# Patient Record
Sex: Female | Born: 1990 | Race: Black or African American | Hispanic: No | Marital: Single | Smoking: Current every day smoker
Health system: Southern US, Community
[De-identification: ages and names within clinical notes are randomized; demographics above are authoritative.]

## PROBLEM LIST (undated history)

## (undated) ENCOUNTER — Inpatient Hospital Stay: Payer: Self-pay

## (undated) DIAGNOSIS — F1410 Cocaine abuse, uncomplicated: Secondary | ICD-10-CM

## (undated) DIAGNOSIS — F1994 Other psychoactive substance use, unspecified with psychoactive substance-induced mood disorder: Secondary | ICD-10-CM

## (undated) DIAGNOSIS — D649 Anemia, unspecified: Secondary | ICD-10-CM

## (undated) DIAGNOSIS — R0600 Dyspnea, unspecified: Secondary | ICD-10-CM

## (undated) DIAGNOSIS — F1120 Opioid dependence, uncomplicated: Secondary | ICD-10-CM

## (undated) DIAGNOSIS — F99 Mental disorder, not otherwise specified: Secondary | ICD-10-CM

---
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
## 2009-02-01 ENCOUNTER — Observation Stay: Payer: Self-pay

## 2009-02-05 ENCOUNTER — Observation Stay: Payer: Self-pay | Admitting: Obstetrics & Gynecology

## 2009-02-06 ENCOUNTER — Inpatient Hospital Stay: Payer: Self-pay | Admitting: Unknown Physician Specialty

## 2010-01-05 ENCOUNTER — Emergency Department: Payer: Self-pay | Admitting: Emergency Medicine

## 2010-02-08 ENCOUNTER — Emergency Department: Payer: Self-pay | Admitting: Emergency Medicine

## 2011-12-06 ENCOUNTER — Emergency Department: Payer: Self-pay | Admitting: *Deleted

## 2013-09-25 ENCOUNTER — Inpatient Hospital Stay: Payer: Self-pay | Admitting: Obstetrics and Gynecology

## 2013-09-25 LAB — PRENATAL PANEL
ABO/RH(D): A POS
Antibody Screen: NEGATIVE
Glucose: 74 mg/dL (ref 65–99)
HCT: 31.5 % — AB (ref 35.0–47.0)
HGB: 10.3 g/dL — ABNORMAL LOW (ref 12.0–16.0)
MCH: 30 pg (ref 26.0–34.0)
MCHC: 32.6 g/dL (ref 32.0–36.0)
MCV: 92 fL (ref 80–100)
PLATELETS: 164 10*3/uL (ref 150–440)
RBC: 3.42 10*6/uL — ABNORMAL LOW (ref 3.80–5.20)
RDW: 14.2 % (ref 11.5–14.5)
WBC: 9.9 10*3/uL (ref 3.6–11.0)

## 2013-09-25 LAB — DRUG SCREEN, URINE
Amphetamines, Ur Screen: NEGATIVE (ref ?–1000)
BENZODIAZEPINE, UR SCRN: NEGATIVE (ref ?–200)
Barbiturates, Ur Screen: NEGATIVE (ref ?–200)
Cannabinoid 50 Ng, Ur ~~LOC~~: POSITIVE (ref ?–50)
Cocaine Metabolite,Ur ~~LOC~~: NEGATIVE (ref ?–300)
MDMA (Ecstasy)Ur Screen: NEGATIVE (ref ?–500)
Methadone, Ur Screen: NEGATIVE (ref ?–300)
OPIATE, UR SCREEN: POSITIVE (ref ?–300)
Phencyclidine (PCP) Ur S: NEGATIVE (ref ?–25)
TRICYCLIC, UR SCREEN: NEGATIVE (ref ?–1000)

## 2013-09-25 LAB — GC/CHLAMYDIA PROBE AMP

## 2013-09-25 LAB — RAPID HIV-1/2 QL/CONFIRM: HIV-1/2,Rapid Ql: NEGATIVE

## 2013-09-27 LAB — HEMATOCRIT: HCT: 26.2 % — ABNORMAL LOW (ref 35.0–47.0)

## 2013-10-27 ENCOUNTER — Emergency Department: Payer: Self-pay | Admitting: Emergency Medicine

## 2013-12-05 ENCOUNTER — Emergency Department: Payer: Self-pay | Admitting: Emergency Medicine

## 2013-12-20 ENCOUNTER — Emergency Department: Payer: Self-pay | Admitting: Internal Medicine

## 2014-03-18 ENCOUNTER — Emergency Department: Payer: Self-pay | Admitting: Emergency Medicine

## 2014-06-19 ENCOUNTER — Emergency Department: Payer: Self-pay | Admitting: Internal Medicine

## 2014-07-09 ENCOUNTER — Emergency Department: Payer: Self-pay | Admitting: Emergency Medicine

## 2014-07-24 ENCOUNTER — Emergency Department: Payer: Self-pay | Admitting: Emergency Medicine

## 2014-07-24 LAB — URINALYSIS, COMPLETE
BACTERIA: NONE SEEN
BILIRUBIN, UR: NEGATIVE
GLUCOSE, UR: NEGATIVE mg/dL (ref 0–75)
KETONE: NEGATIVE
Leukocyte Esterase: NEGATIVE
Nitrite: NEGATIVE
PH: 8 (ref 4.5–8.0)
Protein: 30
Specific Gravity: 1.024 (ref 1.003–1.030)
Squamous Epithelial: 4

## 2014-07-24 LAB — COMPREHENSIVE METABOLIC PANEL
ALK PHOS: 126 U/L — AB
AST: 48 U/L — AB (ref 15–37)
Albumin: 3.3 g/dL — ABNORMAL LOW (ref 3.4–5.0)
Anion Gap: 4 — ABNORMAL LOW (ref 7–16)
BUN: 9 mg/dL (ref 7–18)
Bilirubin,Total: 0.2 mg/dL (ref 0.2–1.0)
CHLORIDE: 107 mmol/L (ref 98–107)
Calcium, Total: 8.6 mg/dL (ref 8.5–10.1)
Co2: 27 mmol/L (ref 21–32)
Creatinine: 0.62 mg/dL (ref 0.60–1.30)
EGFR (Non-African Amer.): >60
GLUCOSE: 95 mg/dL (ref 65–99)
Osmolality: 274 (ref 275–301)
Potassium: 3.8 mmol/L (ref 3.5–5.1)
SGPT (ALT): 53 U/L
Sodium: 138 mmol/L (ref 136–145)
TOTAL PROTEIN: 7.2 g/dL (ref 6.4–8.2)

## 2014-07-24 LAB — CBC
HCT: 37.6 % (ref 35.0–47.0)
HGB: 12.2 g/dL (ref 12.0–16.0)
MCH: 27.3 pg (ref 26.0–34.0)
MCHC: 32.4 g/dL (ref 32.0–36.0)
MCV: 84 fL (ref 80–100)
PLATELETS: 274 10*3/uL (ref 150–440)
RBC: 4.46 10*6/uL (ref 3.80–5.20)
RDW: 14.1 % (ref 11.5–14.5)
WBC: 7.2 10*3/uL (ref 3.6–11.0)

## 2014-07-24 LAB — WET PREP, GENITAL

## 2014-07-25 LAB — GC/CHLAMYDIA PROBE AMP

## 2014-09-10 ENCOUNTER — Emergency Department: Payer: Self-pay | Admitting: Emergency Medicine

## 2014-11-03 NOTE — Consult Note (Signed)
   Maternal Age 24   Gravida 2   Para 1   Term Deliveries 1   Preterm Deliveries 0   Abortions 0   Living Children 1   Final EDD (dd-[id number]) 22-Sep-2013   GA Assessment: (Weeks) 40 week(s)   (Days) 3 day(s)   Gestation Single   Blood Type (Maternal) A positive   Antibody Screen Results (Maternal) negative   HIV Results (Maternal) unknown   Gonorrhea Results (Maternal) unknown   Chlamydia Results (Maternal) unknown   Hepatitis C Culture (Maternal) unknown   VDRL/RPR/Syphilis Results (Maternal) unknown   Varicella Titer Results (Maternal) Unknown   Rubella Results (Maternal) unknown   Hepatitis B Surface Antigen Results (Maternal) unknown   Group B Strep Results Maternal (Result >5wks must be treated as unknown) unknown/result > 5 weeks ago   GBS Prophylaxis hours prior to delivery Initiated on admission   Prenatal Care Poor    Additional Comments Ms. Tara BroachOctavia Raymond is a 24 y.o. G2P1001 female at 40.3 weeks by dates (LMP sometime in July[facility], who presents to L&D secondary to postdates status. Likely PPROM with leaking of fluid x 1 week. Prenatal records unavailable. She reports having been seen by Health Department since 6 months of pregnancy in [place].  Recently relocated [place], [place]. An ultrasound was performed on admission with noted anhydramnios (vs. loss of fluid) and size discrepancy with estimate of  35 weeks completed gestation. Pregnancy otherwise uncomplicated though records indicate EtOH and marijuana use during this pregnancy. Induction in progress. Possibility that infant may acutally be late preterm was discussed with [name] including possibility of SCN admission due to complications including sepsis, thermoregulation, hypoglycemia and feeding difficulties.  RECOMMENDATIONS: - Attempt to obtain prenatal care records from [place] - Follow-up results of prenatal labs to include HIV, Hep B and RPR - Continue  antibiotics for GBS prophylaxis - Obtain urine and meconium for toxicology screens after birth due to inadequate prenatal care - Monitor for problems associated with late preterm infant as mentioned above - Provide early feeding and monitor glucoses closely - Monitor for s/s of sepsis  Thank you for allowing us to participate in [name]' care. We will be available as needed upon delivery.   Parental Contact Parents informed at length regarding prenatal care and plan   Electronic Signatures: Lowella CurbBlake, Lamarr Feenstra (NP)   (Signed 16-Mar-15 19:07)  Authored: PREGNANCY and LABOR, ADDITIONAL COMMENTS Serita GritWimmer, John E (MD)   (Signed 02-Apr-15 09:22)  Co-Signer: PREGNANCY and LABOR, ADDITIONAL COMMENTS  Last Updated: 02-Apr-15 09:22 by Serita GritWimmer, John E (MD)

## 2014-11-20 NOTE — H&P (Signed)
L&D Evaluation:  History:  HPI Ms. Don BroachOctavia Cheatwood is a 24 y.o. G2P1001 female at 40.3 weeks by dates (LMP sometime in July), [facility] with [facility], who presents to L&D secondary to postdates status.  Denies contractions, vaginal bleeding, decreased fetal movement.  Does report LOF x 1 week. Denies fevers, chills, abdominal pain.  Has been seen by Health Department since 6 months of pregnancy in [place].  Recently relocated to [place], [place].   Presents with leaking fluid, postdates status   Patient's Medical History No Chronic Illness  h/o low back pain   Patient's Surgical History none   Medications Pre Natal Vitamins   Allergies NKDA   Social History EtOH  marijuana (for nausea/vomiting). h/o ETOH prior to discovery of pregnancy.   Family History Non-Contributory   ROS:  ROS All systems were reviewed.  HEENT, CNS, GI, GU, Respiratory, CV, Renal and Musculoskeletal systems were found to be normal.   Exam:  Vital Signs stable   General no apparent distress   Mental Status clear   Chest clear   Heart normal sinus rhythm, no murmur/gallop/rubs   Abdomen gravid, non-tender   Estimated Fetal Weight Small for gestational age   Fetal Position cephalic   Back no CVAT   Edema 1+  Nonpitting  ankle   Reflexes 2+   Clonus negative   Pelvic no external lesions, cervix 1/30/c/-3/PPROM   Mebranes Ruptured   Description clear, per pt (   FHT normal rate with no decels   Fetal Heart Rate 140   Ucx absent   Other Ultrasound reports EGA 35.2 weeks, EDD 10/28/13, normal gross anatomy, cephalic, anterior placenta without previa, annhydramnios (largest pocket 0.2 cm).   Impression:  Impression PPROM, prolonged, currently afebrile, no  PNMC   Plan:  Plan EFM/NST, monitor contractions and for cervical change, antibiotics for GBBS prophylaxis, fluids, Induction of labor with Pitocin (low dose)   Comments Pediatric consult for prematurity.  GBS status  unknown.  Will start GBS prophylaxis.  Non-clinic labs ordered.  Will attempt to get records from Health Department in [place]. Limit pelvic exams due to membrane ruptured status.  Monitor for s/s of chorioamnionitis.   Electronic Signatures: Fabian Novemberherry, Logen Heintzelman S (MD)  (Signed 16-Mar-15 23:26)  Authored: L&D Evaluation   Last Updated: 16-Mar-15 23:26 by Fabian Novemberherry, Cana Mignano S (MD)

## 2016-01-28 ENCOUNTER — Encounter: Payer: Self-pay | Admitting: Emergency Medicine

## 2016-01-28 ENCOUNTER — Inpatient Hospital Stay
Admission: EM | Admit: 2016-01-28 | Discharge: 2016-02-04 | Disposition: A | Discharge status: Home / Self Care | Payer: Medicaid Other | Attending: Emergency Medicine | Admitting: Emergency Medicine

## 2016-01-28 DIAGNOSIS — F1123 Opioid dependence with withdrawal: Secondary | ICD-10-CM

## 2016-01-28 DIAGNOSIS — F1110 Opioid abuse, uncomplicated: Secondary | ICD-10-CM | POA: Diagnosis not present

## 2016-01-28 DIAGNOSIS — N390 Urinary tract infection, site not specified: Secondary | ICD-10-CM | POA: Insufficient documentation

## 2016-01-28 DIAGNOSIS — O99333 Smoking (tobacco) complicating pregnancy, third trimester: Secondary | ICD-10-CM | POA: Diagnosis not present

## 2016-01-28 DIAGNOSIS — O99323 Drug use complicating pregnancy, third trimester: Secondary | ICD-10-CM | POA: Insufficient documentation

## 2016-01-28 DIAGNOSIS — Z3A38 38 weeks gestation of pregnancy: Secondary | ICD-10-CM | POA: Diagnosis not present

## 2016-01-28 DIAGNOSIS — O26833 Pregnancy related renal disease, third trimester: Secondary | ICD-10-CM | POA: Insufficient documentation

## 2016-01-28 DIAGNOSIS — F1193 Opioid use, unspecified with withdrawal: Secondary | ICD-10-CM

## 2016-01-28 DIAGNOSIS — F17210 Nicotine dependence, cigarettes, uncomplicated: Secondary | ICD-10-CM | POA: Insufficient documentation

## 2016-01-28 DIAGNOSIS — Z5181 Encounter for therapeutic drug level monitoring: Secondary | ICD-10-CM | POA: Insufficient documentation

## 2016-01-28 DIAGNOSIS — F1910 Other psychoactive substance abuse, uncomplicated: Secondary | ICD-10-CM | POA: Insufficient documentation

## 2016-01-28 DIAGNOSIS — F1410 Cocaine abuse, uncomplicated: Secondary | ICD-10-CM | POA: Diagnosis not present

## 2016-01-28 DIAGNOSIS — F1111 Opioid abuse, in remission: Secondary | ICD-10-CM

## 2016-01-28 DIAGNOSIS — O2343 Unspecified infection of urinary tract in pregnancy, third trimester: Secondary | ICD-10-CM

## 2016-01-28 DIAGNOSIS — Z046 Encounter for general psychiatric examination, requested by authority: Secondary | ICD-10-CM

## 2016-01-28 DIAGNOSIS — Z720 Tobacco use: Secondary | ICD-10-CM

## 2016-01-28 DIAGNOSIS — F322 Major depressive disorder, single episode, severe without psychotic features: Secondary | ICD-10-CM

## 2016-01-28 DIAGNOSIS — F1994 Other psychoactive substance use, unspecified with psychoactive substance-induced mood disorder: Secondary | ICD-10-CM

## 2016-01-28 DIAGNOSIS — O0930 Supervision of pregnancy with insufficient antenatal care, unspecified trimester: Secondary | ICD-10-CM

## 2016-01-28 DIAGNOSIS — F1420 Cocaine dependence, uncomplicated: Secondary | ICD-10-CM

## 2016-01-28 DIAGNOSIS — Z3490 Encounter for supervision of normal pregnancy, unspecified, unspecified trimester: Secondary | ICD-10-CM

## 2016-01-28 DIAGNOSIS — Z3493 Encounter for supervision of normal pregnancy, unspecified, third trimester: Secondary | ICD-10-CM

## 2016-01-28 HISTORY — DX: Mental disorder, not otherwise specified: F99

## 2016-01-28 LAB — URINE DRUG SCREEN, QUALITATIVE (ARMC ONLY)
AMPHETAMINES, UR SCREEN: NOT DETECTED
BENZODIAZEPINE, UR SCRN: NOT DETECTED
Barbiturates, Ur Screen: NOT DETECTED
Cannabinoid 50 Ng, Ur ~~LOC~~: POSITIVE — AB
Cocaine Metabolite,Ur ~~LOC~~: POSITIVE — AB
MDMA (Ecstasy)Ur Screen: NOT DETECTED
METHADONE SCREEN, URINE: NOT DETECTED
Opiate, Ur Screen: NOT DETECTED
PHENCYCLIDINE (PCP) UR S: NOT DETECTED
Tricyclic, Ur Screen: NOT DETECTED

## 2016-01-28 LAB — CBC
HEMATOCRIT: 31.8 % — AB (ref 35.0–47.0)
Hemoglobin: 10.3 g/dL — ABNORMAL LOW (ref 12.0–16.0)
MCH: 26.9 pg (ref 26.0–34.0)
MCHC: 32.6 g/dL (ref 32.0–36.0)
MCV: 82.5 fL (ref 80.0–100.0)
Platelets: 187 10*3/uL (ref 150–440)
RBC: 3.85 MIL/uL (ref 3.80–5.20)
RDW: 14.6 % — AB (ref 11.5–14.5)
WBC: 5.3 10*3/uL (ref 3.6–11.0)

## 2016-01-28 LAB — COMPREHENSIVE METABOLIC PANEL
ALK PHOS: 154 U/L — AB (ref 38–126)
ALT: 53 U/L (ref 14–54)
AST: 77 U/L — AB (ref 15–41)
Albumin: 2.9 g/dL — ABNORMAL LOW (ref 3.5–5.0)
Anion gap: 7 (ref 5–15)
BUN: 6 mg/dL (ref 6–20)
CHLORIDE: 102 mmol/L (ref 101–111)
CO2: 25 mmol/L (ref 22–32)
CREATININE: 0.55 mg/dL (ref 0.44–1.00)
Calcium: 9 mg/dL (ref 8.9–10.3)
GFR calc Af Amer: >60 mL/min (ref >60)
Glucose, Bld: 100 mg/dL — ABNORMAL HIGH (ref 65–99)
Potassium: 3 mmol/L — ABNORMAL LOW (ref 3.5–5.1)
SODIUM: 134 mmol/L — AB (ref 135–145)
Total Bilirubin: 0.5 mg/dL (ref 0.3–1.2)
Total Protein: 6.9 g/dL (ref 6.5–8.1)

## 2016-01-28 LAB — SALICYLATE LEVEL

## 2016-01-28 LAB — URINALYSIS COMPLETE WITH MICROSCOPIC (ARMC ONLY)
Bilirubin Urine: NEGATIVE
GLUCOSE, UA: NEGATIVE mg/dL
HGB URINE DIPSTICK: NEGATIVE
Ketones, ur: NEGATIVE mg/dL
Nitrite: NEGATIVE
Protein, ur: NEGATIVE mg/dL
Specific Gravity, Urine: 1.006 (ref 1.005–1.030)
pH: 6 (ref 5.0–8.0)

## 2016-01-28 LAB — ETHANOL: Alcohol, Ethyl (B): <5 mg/dL (ref <5)

## 2016-01-28 LAB — ACETAMINOPHEN LEVEL

## 2016-01-28 MED ORDER — ONDANSETRON 4 MG PO TBDP
4.0000 mg | ORAL_TABLET | Freq: Once | ORAL | Status: AC
  Administered 2016-01-28: 4 mg via ORAL
  Filled 2016-01-28: qty 1

## 2016-01-28 MED ORDER — CEPHALEXIN 500 MG PO CAPS: 500.0000 mg | ORAL_CAPSULE | Freq: Two times a day (BID) | ORAL | Status: DC

## 2016-01-28 MED ORDER — ACETAMINOPHEN 325 MG PO TABS
650.0000 mg | ORAL_TABLET | Freq: Once | ORAL | Status: AC
  Administered 2016-01-28: 650 mg via ORAL
  Filled 2016-01-28: qty 2

## 2016-01-28 MED ORDER — CEPHALEXIN 500 MG PO CAPS
500.0000 mg | ORAL_CAPSULE | Freq: Two times a day (BID) | ORAL | Status: DC
  Administered 2016-01-28 – 2016-02-03 (×10): 500 mg via ORAL
  Filled 2016-01-28 (×16): qty 1

## 2016-01-28 MED ORDER — METHADONE HCL 10 MG/ML PO CONC
20.0000 mg | Freq: Once | ORAL | Status: AC
  Administered 2016-01-28: 20 mg via ORAL
  Filled 2016-01-28: qty 2

## 2016-01-28 NOTE — ED Provider Notes (Signed)
[facility] Emergency Department Provider Note  ____________________________________________  Time seen: Approximately 4:05 PM  I have reviewed the triage vital signs and the nursing notes.   HISTORY  Chief Complaint Drug Problem    HPI Tara Raymond is a 25 y.o. female G3 P2 at approximately 3232 weeks' gestation who [facility] with [facility] from [facility].  [facility].  She reports that over the last year she has started using cocaine and heroin daily, upwards of $200 a day worth.  She snorts and smokes the cocaine and snorts and injects heroin.  She denies the use of any other substances except for tobacco and marijuana. She denies SI and HI and states that she is seeking help because she knows she needs to get help for her baby.  [facility] few times for prenatal care but not consistently.  She is complaining of moderate lower back pain bilaterally as well as pain in her groin.  She describes all of them is aching pains that moving around and standing make worse, nothing makes better.  She denies fever/chills, chest pain, shortness of breath, abdominal pain, abdominal cramping, dysuria, vaginal bleeding.  He states that her drug addiction is severe and has been gradual in onset over the last year.   History reviewed. No pertinent past medical history.  There are no active problems to display for this patient.   History reviewed. No pertinent past surgical history.  No current outpatient prescriptions on file.  Allergies Review of patient's allergies indicates no known allergies.  No family history on file.  Social History Social History  Substance Use Topics  . Smoking status: Current Every Day Smoker  . Smokeless tobacco: None  . Alcohol Use: None    Review of Systems Constitutional: No fever/chills Eyes:  No visual changes. ENT: No sore throat. Cardiovascular: Denies chest pain. Respiratory: Denies shortness of breath. Gastrointestinal: No abdominal pain.  No nausea, no vomiting.  No diarrhea.  No constipation. Genitourinary: Negative for dysuria. Musculoskeletal: +back pain. Skin: Negative for rash. Neurological: Negative for headaches, focal weakness or numbness.  10-point ROS otherwise negative.  ____________________________________________   PHYSICAL EXAM:  VITAL SIGNS: ED Triage Vitals  Enc Vitals Group     BP 01/28/16 1520 121/69 mmHg     Pulse Rate 01/28/16 1520 90     Resp 01/28/16 1520 18     Temp 01/28/16 1520 97.8 F (36.6 C)     Temp Source 01/28/16 1520 Oral     SpO2 01/28/16 1520 98 %     Weight 01/28/16 1520 150 lb (68.04 kg)     Height 01/28/16 1520 4\' 9"  (1.448 m)     Head Cir --      Peak Flow --      Pain Score --      Pain Loc --      Pain Edu? --      Excl. in GC? --     Constitutional: Alert and oriented. Disheveled, in no acute distress. Eyes: Conjunctivae are normal. PERRL. EOMI. Head: Atraumatic. Nose: No congestion/rhinnorhea. Mouth/Throat: Mucous membranes are moist.  Oropharynx non-erythematous. Neck: No stridor.  No meningeal signs.  No cervical spine tenderness to palpation. Cardiovascular: Normal rate, regular rhythm. Good peripheral circulation. Grossly normal heart sounds.   Respiratory: Normal respiratory effort.  No retractions. Lungs CTAB. Gastrointestinal: Soft and  nontender. No distention.  Genitourinary: Deferred Musculoskeletal: No lower extremity tenderness nor edema. No gross deformities of extremities. Neurologic:  Normal speech and language. No gross focal neurologic deficits are appreciated.  Skin:  Skin is warm, dry and intact. No rash noted. Psychiatric: Mood and affect are normal. Speech and behavior are normal at this time.  She denies SI and HI and admits that she needs  help.  ____________________________________________   LABS (all labs ordered are listed, but only abnormal results are displayed)  Labs Reviewed  COMPREHENSIVE METABOLIC PANEL - Abnormal; Notable for the following:    Sodium 134 (*)    Potassium 3.0 (*)    Glucose, Bld 100 (*)    Albumin 2.9 (*)    AST 77 (*)    Alkaline Phosphatase 154 (*)    All other components within normal limits  CBC - Abnormal; Notable for the following:    Hemoglobin 10.3 (*)    HCT 31.8 (*)    RDW 14.6 (*)    All other components within normal limits  ETHANOL  URINALYSIS COMPLETEWITH MICROSCOPIC ([facility] ONLY)  SALICYLATE LEVEL  ACETAMINOPHEN LEVEL  URINE DRUG SCREEN, QUALITATIVE ([facility] ONLY)   ____________________________________________  EKG  None ____________________________________________  RADIOLOGY   No results found.  ____________________________________________   PROCEDURES  Procedure(s) performed:   Procedures   ____________________________________________   INITIAL IMPRESSION / ASSESSMENT AND PLAN / ED COURSE  Pertinent labs & imaging results that were available during my care of the patient were reviewed by me and considered in my medical decision making (see chart for details).  The patient has normal vital signs and is in no acute distress.  She has back pain and pain in her groin that is consistent with the discomfort of pregnancy, musculoskeletal and possibly round ligament pain.  She has had no vaginal bleeding.  She is declining fetal heart tones at this time.  I have ordered a psychiatry consult and will likely need placement given her high risk pregnancy and homelessness, but I will defer this to psychiatry/TTS.  She does not appear to have any acute medical issues at this time.   (Note that documentation was delayed due to multiple ED patients requiring immediate care.)  Possible UTI.  Since pregnant, will initiate empiric treatment (Keflex) and send urine  culture.  Intake counselor found a place in [facility] but will not be able to take her until tomorrow. ____________________________________________  FINAL CLINICAL IMPRESSION(S) / ED DIAGNOSES  Final diagnoses:  Polysubstance abuse  Heroin abuse  Cocaine abuse  Tobacco abuse  Third trimester pregnancy at less than 36 weeks  Involuntary commitment     MEDICATIONS GIVEN DURING THIS VISIT:  Medications - No data to display   NEW OUTPATIENT MEDICATIONS STARTED DURING THIS VISIT:  New Prescriptions   No medications on file      Note:  This document was prepared using Dragon voice recognition software and may include unintentional dictation errors.   Loleta Rose, MD 01/28/16 [phone]

## 2016-01-28 NOTE — ED Notes (Signed)
Brought in by [employer] with IVC papers.  Pt [redacted] wks pregnant and on cocaine.

## 2016-01-28 NOTE — ED Notes (Signed)
Pt refused to let this RN perform fetal heart tones out in triage.

## 2016-01-28 NOTE — ED Notes (Signed)
Pt in via triage; brought in by [employer] with IVC papers.  Pt states, "I think I'm here for detox."  Pt reports last time using was this morning, using "IV heroin and crack/cocaine."  Pt [redacted] weeks pregnant per previous note; pt states, "7, 6 months, I don't know mam."  Pt denies any SI, HI.  Pt A/Ox4, no immediate distress at this time.

## 2016-01-28 NOTE — Consult Note (Signed)
[facility] Consult   Reason for Consult:  Consult for 25 year old woman sent here under involuntary commitment from [facility] Referring Physician:  Karma Greaser Patient Identification: Tara Raymond MRN:  [id number] Principal Diagnosis: Substance induced mood disorder [facility]) Diagnosis:   Patient Active Problem List   Diagnosis Date Noted  . Substance induced mood disorder ([place]) [F19.94] 01/28/2016  . Severe major depression, single episode, without psychotic features ([place]) [F32.2] 01/28/2016  . Cocaine abuse [F14.10] 01/28/2016  . Opiate abuse, continuous [F11.10] 01/28/2016  . Opiate withdrawal [facility]) [F11.23] 01/28/2016    Total Time spent with patient: 1 hour  Subjective:   Tara Raymond is a 25 y.o. female patient admitted with "they just picked me up and brought me here".  HPI:  Patient interviewed. Chart reviewed including referral information from [place]. Labs and vitals reviewed. This 25 year old woman says that she was just out on the streets today when the police picked her up and took her to [place]. Apparently she was in an area where it was clear that she was either prostituting or buying drugs. At [facility] she was evaluated and they filed commitment papers based on the fact that she is using drugs while pregnant. Patient tells me that her mood has been depressed anxious tearful and upset for many weeks now. She last got out of jail somewhere between 1 and 2 months ago and ever since then she is been agitated and depressed. She feels like she can't sleep at all at night. Her appetite and eating habits of been very poor. She is not taking care of herself well. She admits that she is using cocaine and heroin on a daily basis. Smoking crack and shooting heroin daily. A little bit of alcohol but not a lot. Some marijuana. Patient denies that she has any thoughts of wanting to die or wanting to kill her self. Denies any thought of wanting to harm anyone else. Denies any hallucinations  except when she is very high. No evidence of psychosis. Doesn't really have a safer stable place to stay. Has pretty much been staying on the streets for the last several weeks.  Medical history: Patient is reported to be [redacted] weeks pregnant. As far as we know it's a normal pregnancy although I don't have any documentation. She says she has had 2 children already neither of whom is in her custody. Does not know of any others medical problems.  Social history: She does have family but she doesn't stay in touch with him. Sounds like she's pretty estranged from all of them.  Substance abuse history: She says she's been having drug problems for at least the last 2 years. Ever since she's been using narcotics and smoking crack she has not been able to stop at all. Never been in any kind of substance abuse treatment or detox.  Past Psychiatric History: She says that she's had this depression for a couple months now. She's had depressed symptoms in the past but has never had any kind of treatment or evaluation. No inpatient treatment. No medication. Never tried to kill her self. No history of psychosis.  Risk to Self: Is patient at risk for suicide?: No Risk to Others:   Prior Inpatient Therapy:   Prior Outpatient Therapy:    Past Medical History: History reviewed. No pertinent past medical history. History reviewed. No pertinent past surgical history. Family History: No family history on file. Family Psychiatric  History: She says that multiple people in her family have drug problems  and she is also aware of several people with mood problems including bipolar disorder Social History:  History  Alcohol Use: Not on file     History  Drug Use Not on file    Social History   Social History  . Marital Status: Single    Spouse Name: N/A  . Number of Children: N/A  . Years of Education: N/A   Social History Main Topics  . Smoking status: Current Every Day Smoker  . Smokeless tobacco: None  .  Alcohol Use: None  . Drug Use: None  . Sexual Activity: Not Asked   Other Topics Concern  . None   Social History Narrative  . None   Additional Social History:    Allergies:  No Known Allergies  Labs:  Results for orders placed or performed during the hospital encounter of 01/28/16 (from the past 48 hour(s))  Comprehensive metabolic panel     Status: Abnormal   Collection Time: 01/28/16  3:24 PM  Result Value Ref Range   Sodium 134 (L) 135 - 145 mmol/L   Potassium 3.0 (L) 3.5 - 5.1 mmol/L   Chloride 102 101 - 111 mmol/L   CO2 25 22 - 32 mmol/L   Glucose, Bld 100 (H) 65 - 99 mg/dL   BUN 6 6 - 20 mg/dL   Creatinine, Ser 0.55 0.44 - 1.00 mg/dL   Calcium 9.0 8.9 - 10.3 mg/dL   Total Protein 6.9 6.5 - 8.1 g/dL   Albumin 2.9 (L) 3.5 - 5.0 g/dL   AST 77 (H) 15 - 41 U/L   ALT 53 14 - 54 U/L   Alkaline Phosphatase 154 (H) 38 - 126 U/L   Total Bilirubin 0.5 0.3 - 1.2 mg/dL   GFR calc non Af Amer >60 >60 mL/min   GFR calc Af Amer >60 >60 mL/min    Comment: (NOTE) The eGFR has been calculated using the CKD EPI equation. This calculation has not been validated in all clinical situations. eGFR's persistently <60 mL/min signify possible Chronic Kidney Disease.    Anion gap 7 5 - 15  Ethanol     Status: None   Collection Time: 01/28/16  3:24 PM  Result Value Ref Range   Alcohol, Ethyl (B) <5 <5 mg/dL    Comment:        LOWEST DETECTABLE LIMIT FOR SERUM ALCOHOL IS 5 mg/dL FOR MEDICAL PURPOSES ONLY   cbc     Status: Abnormal   Collection Time: 01/28/16  3:24 PM  Result Value Ref Range   WBC 5.3 3.6 - 11.0 K/uL   RBC 3.85 3.80 - 5.20 MIL/uL   Hemoglobin 10.3 (L) 12.0 - 16.0 g/dL   HCT 31.8 (L) 35.0 - 47.0 %   MCV 82.5 80.0 - 100.0 fL   MCH 26.9 26.0 - 34.0 pg   MCHC 32.6 32.0 - 36.0 g/dL   RDW 14.6 (H) 11.5 - 14.5 %   Platelets 187 314 - 970 K/uL  Salicylate level     Status: None   Collection Time: 01/28/16  3:24 PM  Result Value Ref Range   Salicylate Lvl <2.6 2.8 -  30.0 mg/dL  Acetaminophen level     Status: Abnormal   Collection Time: 01/28/16  3:24 PM  Result Value Ref Range   Acetaminophen (Tylenol), Serum <10 (L) 10 - 30 ug/mL    Comment:        THERAPEUTIC CONCENTRATIONS VARY SIGNIFICANTLY. A RANGE OF 10-30 ug/mL MAY BE AN EFFECTIVE CONCENTRATION  FOR MANY PATIENTS. HOWEVER, SOME ARE BEST TREATED AT CONCENTRATIONS OUTSIDE THIS RANGE. ACETAMINOPHEN CONCENTRATIONS >150 ug/mL AT 4 HOURS AFTER INGESTION AND >50 ug/mL AT 12 HOURS AFTER INGESTION ARE OFTEN ASSOCIATED WITH TOXIC REACTIONS.     Current Facility-Administered Medications  Medication Dose Route Frequency Provider Last Rate Last Dose  . methadone (DOLOPHINE) 0.4 mg/mL oral solution 20 mg  20 mg Oral Once Gonzella Lex, MD       No current outpatient prescriptions on file.    Musculoskeletal: Strength & Muscle Tone: within normal limits Gait & Station: normal Patient leans: N/A  Psychiatric Specialty Exam: Physical Exam  Nursing note and vitals reviewed. Constitutional: She appears well-developed and well-nourished.  HENT:  Head: Normocephalic and atraumatic.  Eyes: Conjunctivae are normal. Pupils are equal, round, and reactive to light.  Neck: Normal range of motion.  Cardiovascular: Regular rhythm and normal heart sounds.   Respiratory: Effort normal. No respiratory distress.  GI: Soft.    Musculoskeletal: Normal range of motion.  Neurological: She is alert.  Skin: Skin is warm and dry.  Psychiatric: Her mood appears anxious. Her affect is labile. Her speech is delayed. She is agitated. Thought content is not paranoid and not delusional. Cognition and memory are normal. She expresses impulsivity. She exhibits a depressed mood. She expresses no suicidal ideation.    Review of Systems  Constitutional: Positive for weight loss.  HENT: Negative.   Eyes: Negative.   Respiratory: Positive for cough.   Cardiovascular: Negative.   Gastrointestinal: Positive for  nausea.  Musculoskeletal: Positive for myalgias.  Skin: Positive for itching.  Neurological: Positive for weakness.  Psychiatric/Behavioral: Positive for depression and substance abuse. Negative for suicidal ideas, hallucinations and memory loss. The patient is nervous/anxious and has insomnia.     Blood pressure 102/62, pulse 100, temperature 97.9 F (36.6 C), temperature source Oral, resp. rate 14, height 4' 9"  (1.448 m), weight 68.04 kg (150 lb), SpO2 100 %.Body mass index is 32.45 kg/(m^2).  General Appearance: Disheveled  Eye Contact:  Minimal  Speech:  Normal Rate  Volume:  Decreased  Mood:  Depressed and Irritable  Affect:  Tearful  Thought Process:  Goal Directed  Orientation:  Full (Time, Place, and Person)  Thought Content:  Logical  Suicidal Thoughts:  No  Homicidal Thoughts:  No  Memory:  Immediate;   Good Recent;   Good Remote;   Fair  Judgement:  Fair  Insight:  Fair  Psychomotor Activity:  Restlessness  Concentration:  Concentration: Fair  Recall:  [employer] of Knowledge:  Fair  Language:  Fair  Akathisia:  No  Handed:  Right  AIMS (if indicated):     Assets:  Communication Skills Desire for Improvement Physical Health Resilience  ADL's:  Intact  Cognition:  WNL  Sleep:        Treatment Plan Summary: Daily contact with patient to assess and evaluate symptoms and progress in treatment, Medication management and Plan 25 year old woman with a drug problem with opiate and cocaine dependence. Already reporting some symptoms of opiate withdrawal including itching all over, aches and pains, restlessness GI distress. Patient has symptoms of severe major depression but completely denies suicidal ideation. No evidence of psychosis. Patient does not require inpatient psychiatric treatment here. She would benefit from detox and rehabilitation treatment. From what I am told [facility] has already made some kind of arrangements with a rehabilitation facility in [place].  Unfortunately at this point they are closed. Because patient is intending to detox  we will give her 20 mg of methadone orally tonight which can be continued for up to 3 days maximum. Case reviewed with emergency room physician. No other specific medication at this point. Case reviewed with TTS. We will try to get her to rehabilitation as soon as possible.  Disposition: Supportive therapy provided about ongoing stressors. Discussed crisis plan, support from social network, calling 911, coming to the Emergency Department, and calling Suicide Hotline.  Alethia Berthold, MD 01/28/2016 5:31 PM

## 2016-01-28 NOTE — Progress Notes (Signed)
Spoke with [facility] representative Lillia AbedLindsay for collateral, per [employer]. Per [facility], recommended referral [facility] [place] [phone] upon medical clearance. Received confirmation of clearance from ER Dr. York CeriseForbach Afterwards contacted [facility], was closed for business for day left voicemail to contact TTS counselor. Informed Dr. Toni Amendlapacs who saw pt. For consult, and per Dr. Toni Amendlapacs recommend stay overnight to seek placement in a.m. And pt. Is agreeable to [facility] tx. Continuation per Dr. Toni Amendlapacs.  Ivie Maese K. Sherlon HandingHarris, LCAS-A, LPC-A, [facility]  Counselor 01/28/2016 5:43 PM

## 2016-01-29 MED ORDER — METHADONE HCL 10 MG/ML PO CONC
20.0000 mg | Freq: Once | ORAL | Status: AC
  Administered 2016-01-29: 20 mg via ORAL
  Filled 2016-01-29: qty 2

## 2016-01-29 MED ORDER — ACETAMINOPHEN 325 MG PO TABS
ORAL_TABLET | ORAL | Status: AC
  Filled 2016-01-29: qty 2

## 2016-01-29 MED ORDER — ACETAMINOPHEN 325 MG PO TABS
650.0000 mg | ORAL_TABLET | Freq: Once | ORAL | Status: DC
  Filled 2016-01-29 (×2): qty 2

## 2016-01-29 NOTE — ED Notes (Signed)
Patient in room watching television. No signs of acute distress noted. Maintained on 15 minute checks and observation by security camera for safety.  

## 2016-01-29 NOTE — BH Assessment (Signed)
Writer called back [facility] [place] and spoke with Manpower IncCharee [phone]). She stated will passed the message to their Intake Worker Kathryne Sharper(Latoya Riggs).

## 2016-01-29 NOTE — ED Provider Notes (Signed)
-----------------------------------------   8:40 AM on 01/29/2016 -----------------------------------------   Blood pressure 119/59, pulse 92, temperature 97.9 F (36.6 C), temperature source Oral, resp. rate 16, height 4\' 9"  (1.448 m), weight 150 lb (68.04 kg), SpO2 98 %.  The patient had no acute events since last update.  Calm and cooperative at this time.  Disposition is pending per Psychiatry/Behavioral Medicine team recommendations.     Arnaldo NatalPaul F Osten Janek, MD 01/29/16 [phone]

## 2016-01-29 NOTE — ED Notes (Addendum)
Tara ParsleyJesse Mapp [phone]) from DSS called asking about patient and her status, and also requested to speak with patient. Writer advised patient to call worker when phone hours began.

## 2016-01-29 NOTE — ED Notes (Signed)
Patient currently denies SI/HI/AVH. Patient endorses generalized pain all over her body and pain in her back rated a 8/10. Patient simply wanted to be repositioned and refused tylenol. Patient staes that she feels like she is still actively withdrawing as she has feelings of things crawling on her skin, stomach upset, anxiety, and tremors. Will address with methadone per physician order. Patient is calm and cooperative. Will continue to monitor. Maintained on 15 minute checks and observation by security camera for safety.

## 2016-01-29 NOTE — ED Notes (Signed)
Patient resting quietly in room. No noted distress or abnormal behaviors noted. Will continue 15 minute checks and observation by security camera for safety. 

## 2016-01-29 NOTE — BH Assessment (Signed)
Writer called [facility] and spoke with Carollee HerterShannon [phone]) and left a message for Kathryne SharperLatoya Riggs for return phone call.

## 2016-01-29 NOTE — ED Notes (Signed)
ED BHU PLACEMENT JUSTIFICATION Is the patient under IVC or is there intent for IVC: Yes.   Is the patient medically cleared: Yes.   Is there vacancy in the ED BHU: Yes.   Is the population mix appropriate for patient: Yes.   Is the patient awaiting placement in inpatient or outpatient setting: Yes.   Has the patient had a psychiatric consult: Yes.   Survey of unit performed for contraband, proper placement and condition of furniture, tampering with fixtures in bathroom, shower, and each patient room: Yes.   APPEARANCE/BEHAVIOR calm, cooperative and adequate rapport can be established NEURO ASSESSMENT Orientation: place and person Hallucinations: No.None noted (Hallucinations) Speech: Normal Gait: normal RESPIRATORY ASSESSMENT Normal expansion.  Clear to auscultation.  No rales, rhonchi, or wheezing. CARDIOVASCULAR ASSESSMENT regular rate and rhythm, S1, S2 normal, no murmur, click, rub or gallop GASTROINTESTINAL ASSESSMENT distended EXTREMITIES normal strength, tone, and muscle mass PLAN OF CARE Provide calm/safe environment. Vital signs assessed twice daily. ED BHU Assessment once each 12-hour shift. Collaborate with intake RN daily or as condition indicates. Assure the ED provider has rounded once each shift. Provide and encourage hygiene. Provide redirection as needed. Assess for escalating behavior; address immediately and inform ED provider.  Assess family dynamic and appropriateness for visitation as needed: Yes.   Educate the patient/family about BHU procedures/visitation: Yes.

## 2016-01-29 NOTE — BH Assessment (Signed)
Patient had a visitor from, [facility] (Bria [phone]). She requesting phone call with updates about patient. She was one of the ones who worked on Education officer, museumgetting placement with [facility].

## 2016-01-29 NOTE — ED Notes (Signed)
ENVIRONMENTAL ASSESSMENT Potentially harmful objects out of patient reach: Yes Personal belongings secured: Yes Patient dressed in hospital provided attire only: Yes Plastic bags out of patient reach: Yes Patient care equipment (cords, cables, call bells, lines, and drains) shortened, removed, or accounted for: Yes Equipment and supplies removed from bottom of stretcher: Yes Potentially toxic materials out of patient reach: Yes Sharps container removed or out of patient reach: Yes  Patient is currently in room sleeping. No signs of distress noted. Maintained on 15 minute checks and observation by security camera for safety.  

## 2016-01-29 NOTE — ED Notes (Signed)
DSS representative Nino ParsleyJesse Mapp [phone]) called asking if the patient had fetal heart monitoring during her time in the emergency room. RN confirmed this test, and confirmed that the patient was still in the emergency room.

## 2016-01-29 NOTE — ED Notes (Signed)
[facility] Placed in room BHU 4 and oriented to unit. Pt. informed that for their safety all care areas are designed for safety and monitored by security cameras at all times; and visiting hours explained to patient. Patient verbalizes understanding, and verbal contract for safety obtained.

## 2016-01-29 NOTE — ED Notes (Signed)
Patient asleep in room. No noted distress or abnormal behavior. Will continue 15 minute checks and observation by security cameras for safety. 

## 2016-01-29 NOTE — BH Assessment (Signed)
Writer called and left a HIPPA Compliant message with [facility] [phone]), requesting a return phone call.

## 2016-01-29 NOTE — ED Notes (Signed)
Patient currently in room resting. No signs of distress noted. Maintained on 15 minute checks and observation by security camera for safety.  

## 2016-01-29 NOTE — ED Notes (Signed)
Patient currently in room watching television. No signs of acute distress noted. Maintained on 15 minute checks and observation by security camera for safety.  

## 2016-01-29 NOTE — ED Notes (Signed)
Report was received from Charles W., RN; Pt. Verbalizes no complaints or distress; denies S.I./Hi. Continue to monitor with 15 min. Monitoring. 

## 2016-01-29 NOTE — ED Notes (Signed)
[facility] called and stated that she would call back later in the evening to discuss patient's admission to the rehab facility. Will continue to await [facility] representative call.

## 2016-01-29 NOTE — ED Notes (Signed)
[facility] representative [phone]) called stating that there was no bed availability today. She stated that there was potential availability in 2 to 3 days. She also stated that there was possible transportation available to the center but that it was unlikely. Representative provided alternate resources to try:   [facility] [place]: [phone] [facility]: [phone]: [phone]  Representative confirmed that she would remain on the waitlist.

## 2016-01-29 NOTE — ED Notes (Signed)
Patient complained of back pain stating that the bed was uncomfortable. An additional mattress was provided to patient for added comfort. Patient stated that this made her more comfortable.

## 2016-01-29 NOTE — Consult Note (Signed)
[facility] Consult   Reason for Consult:  Consult for 25 year old woman sent here under involuntary commitment from [facility] Referring Physician:  Karma Greaser Patient Identification: Tara Raymond MRN:  [id number] Principal Diagnosis: Substance induced mood disorder [facility]) Diagnosis:   Patient Active Problem List   Diagnosis Date Noted  . Substance induced mood disorder ([place]) [F19.94] 01/28/2016  . Severe major depression, single episode, without psychotic features ([place]) [F32.2] 01/28/2016  . Cocaine abuse [F14.10] 01/28/2016  . Opiate abuse, continuous [F11.10] 01/28/2016  . Opiate withdrawal [facility]) [F11.23] 01/28/2016    Total Time spent with patient: 1 hour  Subjective:   Tara Raymond is a 25 y.o. female patient admitted with "they just picked me up and brought me here".  Follow-up note for Wednesday the 20th. 25 year old woman with substance abuse and substance-induced mood disorder. Spoke with her again today. Chart reviewed. Situation reviewed with TTS and emergency room physician. Patient tells me she is feeling better today. Less agitated and less anxious. Has not displayed any dangerous behavior. Patient continues to be willing to get involved in some kind of residential treatment for substance abuse.  HPI:  Patient interviewed. Chart reviewed including referral information from [place]. Labs and vitals reviewed. This 25 year old woman says that she was just out on the streets today when the police picked her up and took her to [place]. Apparently she was in an area where it was clear that she was either prostituting or buying drugs. At [facility] she was evaluated and they filed commitment papers based on the fact that she is using drugs while pregnant. Patient tells me that her mood has been depressed anxious tearful and upset for many weeks now. She last got out of jail somewhere between 1 and 2 months ago and ever since then she is been agitated and depressed. She feels like she  can't sleep at all at night. Her appetite and eating habits of been very poor. She is not taking care of herself well. She admits that she is using cocaine and heroin on a daily basis. Smoking crack and shooting heroin daily. A little bit of alcohol but not a lot. Some marijuana. Patient denies that she has any thoughts of wanting to die or wanting to kill her self. Denies any thought of wanting to harm anyone else. Denies any hallucinations except when she is very high. No evidence of psychosis. Doesn't really have a safer stable place to stay. Has pretty much been staying on the streets for the last several weeks.  Medical history: Patient is reported to be [redacted] weeks pregnant. As far as we know it's a normal pregnancy although I don't have any documentation. She says she has had 2 children already neither of whom is in her custody. Does not know of any others medical problems.  Social history: She does have family but she doesn't stay in touch with him. Sounds like she's pretty estranged from all of them.  Substance abuse history: She says she's been having drug problems for at least the last 2 years. Ever since she's been using narcotics and smoking crack she has not been able to stop at all. Never been in any kind of substance abuse treatment or detox.  Past Psychiatric History: She says that she's had this depression for a couple months now. She's had depressed symptoms in the past but has never had any kind of treatment or evaluation. No inpatient treatment. No medication. Never tried to kill her self. No history of  psychosis.  Risk to Self: Is patient at risk for suicide?: No Risk to Others:   Prior Inpatient Therapy:   Prior Outpatient Therapy:    Past Medical History: History reviewed. No pertinent past medical history. History reviewed. No pertinent past surgical history. Family History: No family history on file. Family Psychiatric  History: She says that multiple people in her family have  drug problems and she is also aware of several people with mood problems including bipolar disorder Social History:  History  Alcohol Use: Not on file     History  Drug Use Not on file    Social History   Social History  . Marital Status: Single    Spouse Name: N/A  . Number of Children: N/A  . Years of Education: N/A   Social History Main Topics  . Smoking status: Current Every Day Smoker  . Smokeless tobacco: None  . Alcohol Use: None  . Drug Use: None  . Sexual Activity: Not Asked   Other Topics Concern  . None   Social History Narrative  . None   Additional Social History:    Allergies:  No Known Allergies  Labs:  Results for orders placed or performed during the hospital encounter of 01/28/16 (from the past 48 hour(s))  Comprehensive metabolic panel     Status: Abnormal   Collection Time: 01/28/16  3:24 PM  Result Value Ref Range   Sodium 134 (L) 135 - 145 mmol/L   Potassium 3.0 (L) 3.5 - 5.1 mmol/L   Chloride 102 101 - 111 mmol/L   CO2 25 22 - 32 mmol/L   Glucose, Bld 100 (H) 65 - 99 mg/dL   BUN 6 6 - 20 mg/dL   Creatinine, Ser 0.55 0.44 - 1.00 mg/dL   Calcium 9.0 8.9 - 10.3 mg/dL   Total Protein 6.9 6.5 - 8.1 g/dL   Albumin 2.9 (L) 3.5 - 5.0 g/dL   AST 77 (H) 15 - 41 U/L   ALT 53 14 - 54 U/L   Alkaline Phosphatase 154 (H) 38 - 126 U/L   Total Bilirubin 0.5 0.3 - 1.2 mg/dL   GFR calc non Af Amer >60 >60 mL/min   GFR calc Af Amer >60 >60 mL/min    Comment: (NOTE) The eGFR has been calculated using the CKD EPI equation. This calculation has not been validated in all clinical situations. eGFR's persistently <60 mL/min signify possible Chronic Kidney Disease.    Anion gap 7 5 - 15  Ethanol     Status: None   Collection Time: 01/28/16  3:24 PM  Result Value Ref Range   Alcohol, Ethyl (B) <5 <5 mg/dL    Comment:        LOWEST DETECTABLE LIMIT FOR SERUM ALCOHOL IS 5 mg/dL FOR MEDICAL PURPOSES ONLY   cbc     Status: Abnormal   Collection Time:  01/28/16  3:24 PM  Result Value Ref Range   WBC 5.3 3.6 - 11.0 K/uL   RBC 3.85 3.80 - 5.20 MIL/uL   Hemoglobin 10.3 (L) 12.0 - 16.0 g/dL   HCT 31.8 (L) 35.0 - 47.0 %   MCV 82.5 80.0 - 100.0 fL   MCH 26.9 26.0 - 34.0 pg   MCHC 32.6 32.0 - 36.0 g/dL   RDW 14.6 (H) 11.5 - 14.5 %   Platelets 187 681 - 157 K/uL  Salicylate level     Status: None   Collection Time: 01/28/16  3:24 PM  Result Value Ref Range  Salicylate Lvl <4.6 2.8 - 30.0 mg/dL  Acetaminophen level     Status: Abnormal   Collection Time: 01/28/16  3:24 PM  Result Value Ref Range   Acetaminophen (Tylenol), Serum <10 (L) 10 - 30 ug/mL    Comment:        THERAPEUTIC CONCENTRATIONS VARY SIGNIFICANTLY. A RANGE OF 10-30 ug/mL MAY BE AN EFFECTIVE CONCENTRATION FOR MANY PATIENTS. HOWEVER, SOME ARE BEST TREATED AT CONCENTRATIONS OUTSIDE THIS RANGE. ACETAMINOPHEN CONCENTRATIONS >150 ug/mL AT 4 HOURS AFTER INGESTION AND >50 ug/mL AT 12 HOURS AFTER INGESTION ARE OFTEN ASSOCIATED WITH TOXIC REACTIONS.   Urine Drug Screen, Qualitative     Status: Abnormal   Collection Time: 01/28/16  4:59 PM  Result Value Ref Range   Tricyclic, Ur Screen NONE DETECTED NONE DETECTED   Amphetamines, Ur Screen NONE DETECTED NONE DETECTED   MDMA (Ecstasy)Ur Screen NONE DETECTED NONE DETECTED   Cocaine Metabolite,Ur [place] POSITIVE (A) NONE DETECTED   Opiate, Ur Screen NONE DETECTED NONE DETECTED   Phencyclidine (PCP) Ur S NONE DETECTED NONE DETECTED   Cannabinoid 50 Ng, Ur [place] POSITIVE (A) NONE DETECTED   Barbiturates, Ur Screen NONE DETECTED NONE DETECTED   Benzodiazepine, Ur Scrn NONE DETECTED NONE DETECTED   Methadone Scn, Ur NONE DETECTED NONE DETECTED    Comment: (NOTE) 286  Tricyclics, urine               Cutoff 1000 ng/mL 200  Amphetamines, urine             Cutoff 1000 ng/mL 300  MDMA (Ecstasy), urine           Cutoff 500 ng/mL 400  Cocaine Metabolite, urine       Cutoff 300 ng/mL 500  Opiate, urine                   Cutoff 300  ng/mL 600  Phencyclidine (PCP), urine      Cutoff 25 ng/mL 700  Cannabinoid, urine              Cutoff 50 ng/mL 800  Barbiturates, urine             Cutoff 200 ng/mL 900  Benzodiazepine, urine           Cutoff 200 ng/mL 1000 Methadone, urine                Cutoff 300 ng/mL 1100 1200 The urine drug screen provides only a preliminary, unconfirmed 1300 analytical test result and should not be used for non-medical 1400 purposes. Clinical consideration and professional judgment should 1500 be applied to any positive drug screen result due to possible 1600 interfering substances. A more specific alternate chemical method 1700 must be used in order to obtain a confirmed analytical result.  1800 Gas chromato graphy / mass spectrometry (GC/MS) is the preferred 1900 confirmatory method.   Urinalysis complete, with microscopic ([facility] only)     Status: Abnormal   Collection Time: 01/28/16  5:00 PM  Result Value Ref Range   Color, Urine YELLOW (A) YELLOW   APPearance CLEAR (A) CLEAR   Glucose, UA NEGATIVE NEGATIVE mg/dL   Bilirubin Urine NEGATIVE NEGATIVE   Ketones, ur NEGATIVE NEGATIVE mg/dL   Specific Gravity, Urine 1.006 1.005 - 1.030   Hgb urine dipstick NEGATIVE NEGATIVE   pH 6.0 5.0 - 8.0   Protein, ur NEGATIVE NEGATIVE mg/dL   Nitrite NEGATIVE NEGATIVE   Leukocytes, UA 1+ (A) NEGATIVE   RBC / HPF 0-5 0 -  5 RBC/hpf   WBC, UA 6-30 0 - 5 WBC/hpf   Bacteria, UA RARE (A) NONE SEEN   Squamous Epithelial / LPF 6-30 (A) NONE SEEN   Mucous PRESENT     Current Facility-Administered Medications  Medication Dose Route Frequency Provider Last Rate Last Dose  . acetaminophen (TYLENOL) tablet 650 mg  650 mg Oral Once Gregor Hams, MD   650 mg at 01/29/16 1154  . cephALEXin (KEFLEX) capsule 500 mg  500 mg Oral Q12H Hinda Kehr, MD   500 mg at 01/29/16 1029   Current Outpatient Prescriptions  Medication Sig Dispense Refill  . cephALEXin (KEFLEX) 500 MG capsule Take 1 capsule (500 mg total)  by mouth 2 (two) times daily. 14 capsule 0    Musculoskeletal: Strength & Muscle Tone: within normal limits Gait & Station: normal Patient leans: N/A  Psychiatric Specialty Exam: Physical Exam  Nursing note and vitals reviewed. Constitutional: She appears well-developed and well-nourished.  HENT:  Head: Normocephalic and atraumatic.  Eyes: Conjunctivae are normal. Pupils are equal, round, and reactive to light.  Neck: Normal range of motion.  Cardiovascular: Regular rhythm and normal heart sounds.   Respiratory: Effort normal. No respiratory distress.  GI: Soft.    Musculoskeletal: Normal range of motion.  Neurological: She is alert.  Skin: Skin is warm and dry.  Psychiatric: Her speech is normal and behavior is normal. Judgment normal. Her mood appears anxious. Her affect is not labile. She is not agitated. Thought content is not paranoid and not delusional. Cognition and memory are normal. She does not express impulsivity. She does not exhibit a depressed mood. She expresses no suicidal ideation.    Review of Systems  Constitutional: Positive for weight loss.  HENT: Negative.   Eyes: Negative.   Respiratory: Positive for cough.   Cardiovascular: Negative.   Gastrointestinal: Positive for nausea.  Musculoskeletal: Positive for myalgias.  Skin: Positive for itching.  Neurological: Positive for weakness.  Psychiatric/Behavioral: Positive for depression and substance abuse. Negative for suicidal ideas, hallucinations and memory loss. The patient is nervous/anxious and has insomnia.     Blood pressure 106/89, pulse 57, temperature 98.8 F (37.1 C), temperature source Oral, resp. rate 18, height 4' 9"  (1.448 m), weight 68.04 kg (150 lb), SpO2 100 %.Body mass index is 32.45 kg/(m^2).  General Appearance: Disheveled  Eye Contact:  Minimal  Speech:  Normal Rate  Volume:  Decreased  Mood:  Depressed and Irritable  Affect:  Tearful  Thought Process:  Goal Directed  Orientation:   Full (Time, Place, and Person)  Thought Content:  Logical  Suicidal Thoughts:  No  Homicidal Thoughts:  No  Memory:  Immediate;   Good Recent;   Good Remote;   Fair  Judgement:  Fair  Insight:  Fair  Psychomotor Activity:  Restlessness  Concentration:  Concentration: Fair  Recall:  [employer] of Knowledge:  Fair  Language:  Fair  Akathisia:  No  Handed:  Right  AIMS (if indicated):     Assets:  Communication Skills Desire for Improvement Physical Health Resilience  ADL's:  Intact  Cognition:  WNL  Sleep:        Treatment Plan Summary: Daily contact with patient to assess and evaluate symptoms and progress in treatment, Medication management and Plan Patient does not meet commitment criteria. I have discontinued the IVC. Patient is continuing to state that she is willing to go to inpatient or residential facility. Our triage coordinators have been working all day to try  to contact the facility in [place]  so that we can facilitate transfer. I have reassured the patient that we are trying to get this to happen as soon as we can. No change to current treatment plan.  Disposition: Supportive therapy provided about ongoing stressors. Discussed crisis plan, support from social network, calling 911, coming to the Emergency Department, and calling Suicide Hotline.  Alethia Berthold, MD 01/29/2016 3:37 PM

## 2016-01-29 NOTE — ED Notes (Signed)
Patient is currently on the phone speaking with DSS worker. No signs of acute distress noted at this time. Maintained on 15 minute checks and observation by security camera for safety.

## 2016-01-30 ENCOUNTER — Emergency Department: Payer: Medicaid Other

## 2016-01-30 LAB — URINE CULTURE: Special Requests: NORMAL

## 2016-01-30 MED ORDER — METHADONE HCL 10 MG/ML PO CONC
15.0000 mg | Freq: Once | ORAL | Status: AC
  Administered 2016-01-30: 15 mg via ORAL
  Filled 2016-01-30: qty 1.5

## 2016-01-30 MED ORDER — METHADONE 0.4 MG/ML ORAL SOLUTION: 15.0000 mg | ORAL | Status: DC

## 2016-01-30 NOTE — BH Specialist Note (Signed)
State Regional Referral Form completed for [facility]  Information faxed to [employer] for Auth #.  Received phone call from [employer] ([phone]) with  Authorization/Tracking Number [id number]).

## 2016-01-30 NOTE — ED Notes (Signed)
Patient lying in bed calm in no apparent distress watching television at this time. Will continue 15 minute checks and observation by security camera for safety.

## 2016-01-30 NOTE — ED Provider Notes (Signed)
-----------------------------------------   12:37 AM on 01/30/2016 -----------------------------------------   Blood pressure 111/72, pulse 73, temperature 97.5 F (36.4 C), temperature source Oral, resp. rate 18, height 4\' 9"  (1.448 m), weight 150 lb (68.04 kg), SpO2 100 %.  The patient had no acute events since last update.  Calm and cooperative at this time.  Disposition is pending per Psychiatry/Behavioral Medicine team recommendations.     Myrna Blazeravid Matthew Luigi Stuckey, MD 01/30/16 [phone]

## 2016-01-30 NOTE — BH Assessment (Signed)
Writer spoke with [facility] ([phone]), they do not have any available beds at this time.  Writer spoke with [facility] ([phone]) an informed her [facility] didn't have any beds and was pursing other options.  Spoke with Constance HolsterBri Miller [phone]) again and she forwarded information for other treatment options.  Received phone call from [place] DSS Brayton Caves(Jessie [phone]), inquiring about the patient. Writer informed her the patient is still waiting bed with [facility] but looking for other treatment options.  Made several attempts to contact someone with [facility] [phone]) but was unable to reach anyone. Spoke with Christean LeafBria Miller and she reports she was unable to reach anyone as well.  Spoke with [facility] [place] [phone]), they don't provide detox for individuals who are pregnant.  [facility] ([phone]), They do not provided any services to pregnant individuals.  [facility] [phone]), they have no beds and they do not accept individuals who are pregnant.   Spoke [facility] ([phone]) and they are willing to look at the patient referral. They are also requesting an [employer] with the referral. Writer spoke with Psych MD (Dr. Toni Amendlapacs) an informed him of the request and he put an ordered in for it.  State referral form was completed and faxed to [facility], with supporting documentation. Writer confirmed it was received ([phone] option 4). At this time, waiting on auth/tracking number from [employer] to submit remaining information to [facility]..Marland Kitchen

## 2016-01-30 NOTE — ED Notes (Signed)
Patient currently in room resting. No signs of acute distress noted at this time. Maintained on 15 minute checks and observation by security camera for safety.

## 2016-01-30 NOTE — Consult Note (Signed)
[facility] Consult   Reason for Consult:  Consult for 25 year old woman sent here under involuntary commitment from [facility] Referring Physician:  Karma Greaser Patient Identification: Tara Raymond MRN:  [id number] Principal Diagnosis: Substance induced mood disorder [facility]) Diagnosis:   Patient Active Problem List   Diagnosis Date Noted  . Substance induced mood disorder ([place]) [F19.94] 01/28/2016  . Severe major depression, single episode, without psychotic features ([place]) [F32.2] 01/28/2016  . Cocaine abuse [F14.10] 01/28/2016  . Opiate abuse, continuous [F11.10] 01/28/2016  . Opiate withdrawal [facility]) [F11.23] 01/28/2016    Total Time spent with patient: 20 minutes  Subjective:   Tara Raymond is a 25 y.o. female patient admitted with "they just picked me up and brought me here".  Follow-up for Thursday the 20th. Patient seen. Chart reviewed. Case discussed with nursing and TTS. Patient is a 25 year old woman with opiate and cocaine abuse who is [redacted] weeks pregnant. Has been here in the emergency room for 2 days awaiting transfer to a residential substance abuse program. When she first was brought to the emergency room we were promised that arrangements had been made. At this point despite heroic efforts by TTS we have not been able to secure a clear bed for her. Patient fortunately remains pleasant and cooperative. She is still complaining of some achiness and GI symptoms typical of opiate withdrawal. Denies suicidality. She was taken off of involuntary commitment yesterday as it was no longer applicable.  HPI:  Patient interviewed. Chart reviewed including referral information from [facility]. Labs and vitals reviewed. This 25 year old woman says that she was just out on the streets today when the police picked her up and took her to [place]. Apparently she was in an area where it was clear that she was either prostituting or buying drugs. At [facility] she was evaluated and they filed commitment  papers based on the fact that she is using drugs while pregnant. Patient tells me that her mood has been depressed anxious tearful and upset for many weeks now. She last got out of jail somewhere between 1 and 2 months ago and ever since then she is been agitated and depressed. She feels like she can't sleep at all at night. Her appetite and eating habits of been very poor. She is not taking care of herself well. She admits that she is using cocaine and heroin on a daily basis. Smoking crack and shooting heroin daily. A little bit of alcohol but not a lot. Some marijuana. Patient denies that she has any thoughts of wanting to die or wanting to kill her self. Denies any thought of wanting to harm anyone else. Denies any hallucinations except when she is very high. No evidence of psychosis. Doesn't really have a safer stable place to stay. Has pretty much been staying on the streets for the last several weeks.  Medical history: Patient is reported to be [redacted] weeks pregnant. As far as we know it's a normal pregnancy although I don't have any documentation. She says she has had 2 children already neither of whom is in her custody. Does not know of any others medical problems.  Social history: She does have family but she doesn't stay in touch with him. Sounds like she's pretty estranged from all of them.  Substance abuse history: She says she's been having drug problems for at least the last 2 years. Ever since she's been using narcotics and smoking crack she has not been able to stop at all. Never been in  any kind of substance abuse treatment or detox.  Past Psychiatric History: She says that she's had this depression for a couple months now. She's had depressed symptoms in the past but has never had any kind of treatment or evaluation. No inpatient treatment. No medication. Never tried to kill her self. No history of psychosis.  Risk to Self: Is patient at risk for suicide?: No Risk to Others:   Prior  Inpatient Therapy:   Prior Outpatient Therapy:    Past Medical History: History reviewed. No pertinent past medical history. History reviewed. No pertinent past surgical history. Family History: No family history on file. Family Psychiatric  History: She says that multiple people in her family have drug problems and she is also aware of several people with mood problems including bipolar disorder Social History:  History  Alcohol Use: Not on file     History  Drug Use Not on file    Social History   Social History  . Marital Status: Single    Spouse Name: N/A  . Number of Children: N/A  . Years of Education: N/A   Social History Main Topics  . Smoking status: Current Every Day Smoker  . Smokeless tobacco: None  . Alcohol Use: None  . Drug Use: None  . Sexual Activity: Not Asked   Other Topics Concern  . None   Social History Narrative  . None   Additional Social History:    Allergies:  No Known Allergies  Labs:  Results for orders placed or performed during the hospital encounter of 01/28/16 (from the past 48 hour(s))  Comprehensive metabolic panel     Status: Abnormal   Collection Time: 01/28/16  3:24 PM  Result Value Ref Range   Sodium 134 (L) 135 - 145 mmol/L   Potassium 3.0 (L) 3.5 - 5.1 mmol/L   Chloride 102 101 - 111 mmol/L   CO2 25 22 - 32 mmol/L   Glucose, Bld 100 (H) 65 - 99 mg/dL   BUN 6 6 - 20 mg/dL   Creatinine, Ser 0.55 0.44 - 1.00 mg/dL   Calcium 9.0 8.9 - 10.3 mg/dL   Total Protein 6.9 6.5 - 8.1 g/dL   Albumin 2.9 (L) 3.5 - 5.0 g/dL   AST 77 (H) 15 - 41 U/L   ALT 53 14 - 54 U/L   Alkaline Phosphatase 154 (H) 38 - 126 U/L   Total Bilirubin 0.5 0.3 - 1.2 mg/dL   GFR calc non Af Amer >60 >60 mL/min   GFR calc Af Amer >60 >60 mL/min    Comment: (NOTE) The eGFR has been calculated using the CKD EPI equation. This calculation has not been validated in all clinical situations. eGFR's persistently <60 mL/min signify possible Chronic  Kidney Disease.    Anion gap 7 5 - 15  Ethanol     Status: None   Collection Time: 01/28/16  3:24 PM  Result Value Ref Range   Alcohol, Ethyl (B) <5 <5 mg/dL    Comment:        LOWEST DETECTABLE LIMIT FOR SERUM ALCOHOL IS 5 mg/dL FOR MEDICAL PURPOSES ONLY   cbc     Status: Abnormal   Collection Time: 01/28/16  3:24 PM  Result Value Ref Range   WBC 5.3 3.6 - 11.0 K/uL   RBC 3.85 3.80 - 5.20 MIL/uL   Hemoglobin 10.3 (L) 12.0 - 16.0 g/dL   HCT 31.8 (L) 35.0 - 47.0 %   MCV 82.5 80.0 - 100.0 fL  MCH 26.9 26.0 - 34.0 pg   MCHC 32.6 32.0 - 36.0 g/dL   RDW 14.6 (H) 11.5 - 14.5 %   Platelets 187 505 - 697 K/uL  Salicylate level     Status: None   Collection Time: 01/28/16  3:24 PM  Result Value Ref Range   Salicylate Lvl <9.4 2.8 - 30.0 mg/dL  Acetaminophen level     Status: Abnormal   Collection Time: 01/28/16  3:24 PM  Result Value Ref Range   Acetaminophen (Tylenol), Serum <10 (L) 10 - 30 ug/mL    Comment:        THERAPEUTIC CONCENTRATIONS VARY SIGNIFICANTLY. A RANGE OF 10-30 ug/mL MAY BE AN EFFECTIVE CONCENTRATION FOR MANY PATIENTS. HOWEVER, SOME ARE BEST TREATED AT CONCENTRATIONS OUTSIDE THIS RANGE. ACETAMINOPHEN CONCENTRATIONS >150 ug/mL AT 4 HOURS AFTER INGESTION AND >50 ug/mL AT 12 HOURS AFTER INGESTION ARE OFTEN ASSOCIATED WITH TOXIC REACTIONS.   Urine Drug Screen, Qualitative     Status: Abnormal   Collection Time: 01/28/16  4:59 PM  Result Value Ref Range   Tricyclic, Ur Screen NONE DETECTED NONE DETECTED   Amphetamines, Ur Screen NONE DETECTED NONE DETECTED   MDMA (Ecstasy)Ur Screen NONE DETECTED NONE DETECTED   Cocaine Metabolite,Ur [place] POSITIVE (A) NONE DETECTED   Opiate, Ur Screen NONE DETECTED NONE DETECTED   Phencyclidine (PCP) Ur S NONE DETECTED NONE DETECTED   Cannabinoid 50 Ng, Ur [place] POSITIVE (A) NONE DETECTED   Barbiturates, Ur Screen NONE DETECTED NONE DETECTED   Benzodiazepine, Ur Scrn NONE DETECTED NONE DETECTED   Methadone Scn, Ur NONE  DETECTED NONE DETECTED    Comment: (NOTE) 801  Tricyclics, urine               Cutoff 1000 ng/mL 200  Amphetamines, urine             Cutoff 1000 ng/mL 300  MDMA (Ecstasy), urine           Cutoff 500 ng/mL 400  Cocaine Metabolite, urine       Cutoff 300 ng/mL 500  Opiate, urine                   Cutoff 300 ng/mL 600  Phencyclidine (PCP), urine      Cutoff 25 ng/mL 700  Cannabinoid, urine              Cutoff 50 ng/mL 800  Barbiturates, urine             Cutoff 200 ng/mL 900  Benzodiazepine, urine           Cutoff 200 ng/mL 1000 Methadone, urine                Cutoff 300 ng/mL 1100 1200 The urine drug screen provides only a preliminary, unconfirmed 1300 analytical test result and should not be used for non-medical 1400 purposes. Clinical consideration and professional judgment should 1500 be applied to any positive drug screen result due to possible 1600 interfering substances. A more specific alternate chemical method 1700 must be used in order to obtain a confirmed analytical result.  1800 Gas chromato graphy / mass spectrometry (GC/MS) is the preferred 1900 confirmatory method.   Urinalysis complete, with microscopic ([facility] only)     Status: Abnormal   Collection Time: 01/28/16  5:00 PM  Result Value Ref Range   Color, Urine YELLOW (A) YELLOW   APPearance CLEAR (A) CLEAR   Glucose, UA NEGATIVE NEGATIVE mg/dL   Bilirubin Urine NEGATIVE NEGATIVE  Ketones, ur NEGATIVE NEGATIVE mg/dL   Specific Gravity, Urine 1.006 1.005 - 1.030   Hgb urine dipstick NEGATIVE NEGATIVE   pH 6.0 5.0 - 8.0   Protein, ur NEGATIVE NEGATIVE mg/dL   Nitrite NEGATIVE NEGATIVE   Leukocytes, UA 1+ (A) NEGATIVE   RBC / HPF 0-5 0 - 5 RBC/hpf   WBC, UA 6-30 0 - 5 WBC/hpf   Bacteria, UA RARE (A) NONE SEEN   Squamous Epithelial / LPF 6-30 (A) NONE SEEN   Mucous PRESENT   Urine culture     Status: Abnormal   Collection Time: 01/28/16  5:00 PM  Result Value Ref Range   Specimen Description URINE, RANDOM     Special Requests Normal    Culture MULTIPLE SPECIES PRESENT, SUGGEST RECOLLECTION (A)    Report Status 01/30/2016 FINAL     Current Facility-Administered Medications  Medication Dose Route Frequency Provider Last Rate Last Dose  . acetaminophen (TYLENOL) tablet 650 mg  650 mg Oral Once Gregor Hams, MD   650 mg at 01/29/16 1154  . cephALEXin (KEFLEX) capsule 500 mg  500 mg Oral Q12H Hinda Kehr, MD   500 mg at 01/30/16 1104   Current Outpatient Prescriptions  Medication Sig Dispense Refill  . cephALEXin (KEFLEX) 500 MG capsule Take 1 capsule (500 mg total) by mouth 2 (two) times daily. 14 capsule 0    Musculoskeletal: Strength & Muscle Tone: within normal limits Gait & Station: normal Patient leans: N/A  Psychiatric Specialty Exam: Physical Exam  Nursing note and vitals reviewed. Constitutional: She appears well-developed and well-nourished.  HENT:  Head: Normocephalic and atraumatic.  Eyes: Conjunctivae are normal. Pupils are equal, round, and reactive to light.  Neck: Normal range of motion.  Cardiovascular: Regular rhythm and normal heart sounds.   Respiratory: Effort normal. No respiratory distress.  GI: Soft.    Musculoskeletal: Normal range of motion.  Neurological: She is alert.  Skin: Skin is warm and dry.  Psychiatric: She has a normal mood and affect. Her speech is normal and behavior is normal. Judgment normal. Her mood appears not anxious. Her affect is not labile. She is not agitated. Thought content is not paranoid and not delusional. Cognition and memory are normal. She does not express impulsivity. She does not exhibit a depressed mood. She expresses no suicidal ideation. She exhibits normal recent memory.    Review of Systems  Constitutional: Positive for weight loss.  HENT: Negative.   Eyes: Negative.   Respiratory: Positive for cough.   Cardiovascular: Negative.   Gastrointestinal: Positive for nausea.  Musculoskeletal: Positive for myalgias.   Skin: Positive for itching.  Neurological: Positive for weakness.  Psychiatric/Behavioral: Positive for depression and substance abuse. Negative for suicidal ideas, hallucinations and memory loss. The patient is nervous/anxious and has insomnia.     Blood pressure 111/72, pulse 73, temperature 97.5 F (36.4 C), temperature source Oral, resp. rate 18, height 4' 9"  (1.448 m), weight 68.04 kg (150 lb), SpO2 100 %.Body mass index is 32.45 kg/(m^2).  General Appearance: Disheveled  Eye Contact:  Minimal  Speech:  Normal Rate  Volume:  Decreased  Mood:  Depressed and Irritable  Affect:  Tearful  Thought Process:  Goal Directed  Orientation:  Full (Time, Place, and Person)  Thought Content:  Logical  Suicidal Thoughts:  No  Homicidal Thoughts:  No  Memory:  Immediate;   Good Recent;   Good Remote;   Fair  Judgement:  Fair  Insight:  Fair  Psychomotor Activity:  Restlessness  Concentration:  Concentration: Fair  Recall:  [employer] of Knowledge:  Fair  Language:  Fair  Akathisia:  No  Handed:  Right  AIMS (if indicated):     Assets:  Communication Skills Desire for Improvement Physical Health Resilience  ADL's:  Intact  Cognition:  WNL  Sleep:        Treatment Plan Summary: Daily contact with patient to assess and evaluate symptoms and progress in treatment, Medication management and Plan It has been less than 72 hours since she first presented to the emergency room. We are still within the window allowing for use of methadone for withdrawal. This will be the last dose we can use. I will give her a 1 time 15 mg methadone dose today slightly less than previous doses. Supportive therapy and encouragement to stick with this while we continue to work on trying to find an appropriate facility for her given that her option is to return to her extremely dangerous situation on the street. No need for any medication orders at this time. Continue in the emergency room off of  IV[facility].  Disposition: Supportive therapy provided about ongoing stressors. Discussed crisis plan, support from social network, calling 911, coming to the Emergency Department, and calling Suicide Hotline.  Alethia Berthold, MD 01/30/2016 12:18 PM

## 2016-01-30 NOTE — ED Notes (Signed)
Patient awake and oriented. She is currently sitting in the dayroom with other patients. She is requesting methadone as she says she has "aches all over" and is feeling "hot and cold." RN reported these symptoms to MD. No other concerns expressed. Maintained on 15 minute checks and observation by security camera for safety.

## 2016-01-30 NOTE — ED Notes (Signed)
Patient resting quietly in room. No noted distress or abnormal behaviors noted. Will continue 15 minute checks and observation by security camera for safety. 

## 2016-01-30 NOTE — ED Notes (Signed)
Patient with labile mood and affect. Upset that she has to stay in the BHU until placement is found. Minimally responsive to reassurance.  Patient took a shower. Maintained on 15 minute checks and observation by security camera for safety.

## 2016-01-30 NOTE — ED Notes (Signed)
ENVIRONMENTAL ASSESSMENT Potentially harmful objects out of patient reach: Yes Personal belongings secured: Yes Patient dressed in hospital provided attire only: Yes Plastic bags out of patient reach: Yes Patient care equipment (cords, cables, call bells, lines, and drains) shortened, removed, or accounted for: Yes Equipment and supplies removed from bottom of stretcher: Yes Potentially toxic materials out of patient reach: Yes Sharps container removed or out of patient reach: Yes  Patient currently in room resting. No signs of distress noted at this time. Maintained on 15 minute checks and observation by security camera for safety.  

## 2016-01-30 NOTE — BH Assessment (Signed)
Attempted to contact [facility] [place] [phone]) but was unable to reach anyone.  Writer called and left a HIPPA Compliant message with [facility] Community Liaison ([phone]), requesting a return phone call.  Writer spoke with [place] [facility] (Bri Miller-[phone]) and updated her on the status of the patient. She stated she wasn't familiar with [facility]. She had spoken with [facility] the day she came to the ER and they didn't have any available beds. She agreed to make several calls in order to help with placement for treatment.

## 2016-01-30 NOTE — ED Notes (Signed)
Dr. Toni Amendlapacs notified by this RN via phone of patient's estimated gestational age of [redacted] weeks 2 days  per ultrasound performed.

## 2016-01-30 NOTE — ED Notes (Signed)
Patient is alert and oriented in no apparent distress. Patient is calm and cooperative. Patient denies SI, HI. Patient is encouraged to let nursing staff know of any concerns or needs. Patient denies contractions, bleeding or leaking fluid at this time. Will continue 15 minute checks and observation by security camera for safety.

## 2016-01-30 NOTE — ED Notes (Signed)
Patient in room resting, given ice chips upon request. Patient states after nurse inquired that she delivered both of her prior babies to full term. Will continue to monitor. Maintained on 15 minute checks and observation by security camera for safety.

## 2016-01-30 NOTE — BH Assessment (Addendum)
Completed referral faxed to [facility] and confirmed it was received ([place]-[phone]). Patient pending review at this time.  Writer provided the patient with phone to complete phone interview with [facility] ([phone]). They do not have any beds at this time but took information to place on wait list.  Writer provided the patient with phone to complete phone interview with [facility] ([phone]). Information will be forward to the supervisor Marthe Patch(Lorri L.) for review. They do not have any beds at this time. Both patient and writer was advised to call back Thursday (02/06/2016) in the event they haven't call the patient by Wednesday (02/05/2016). Ayesha RumpfColin also stated, they would have to do a face to face interview, at their location as part of the screening process. The interview will not be before Thursday (02/06/2016).  Writer spoke with [facility] ([phone]) and they do not have any beds at this time. They still have her information and was advised to call back and check on bed status.  Writer spoke with [facility] (Chervonne-[phone]), via phone call, she will come visit the patient while in the ER and provide her with other treatment options. Writer also informed the patient's nurse Toniann Fail(Wendy L.), about the anticipated visit.  [facility] Care Coordinator Dorothea Ogle(Chervonne), visited with the patient and will assist with other treatment options. After the visit, she [facility] [place] and they have bed availability. She agreed to complete paperwork and submit everything to the facility. She will not have an answer about being admission/acceptance until Monday (02/03/2016). Intake staff will not be back until then and they do not have admissions over the weekend. In the event she's discharged from the ER, Care Coordinator is requesting to be contacted so she can know how to contact the client, to make sure she doesn't miss  the bed. Facility is willing to take patient even if she goes from home/community and not the ER.  Writer spoke with the patient and updated her about the treatment options. Informed her of the timeline and that their will be no movement over the weekend. The earliest date for placement will be on Monday (02/03/2016) and that wasn't a guaranteed. After the conversation, patient agreed to remain in the ER, in order that she and her unborn child will remain safe.   Throughout the day writer talked with [facility] [place] (Bria Miller-[phone]) and updated her on the status of patient.

## 2016-01-30 NOTE — ED Notes (Signed)
Snack tray and ginger ale provided at this time.

## 2016-01-30 NOTE — ED Notes (Signed)
Patient experienced one episode of emesis upon return to unit from ultrasound scan. Ginger ale provided. Patient is currently sitting in dayroom in no apparent distress. Patient denies contractions, bleeding or leaking of fluid at this time.Patient endorses fetal movement. Will continue 15 minute checks and observation by security camera for safety.

## 2016-01-30 NOTE — ED Notes (Signed)
Patient denies SI/HI/AVH and pain. Patient denies feelings of depression and anxiety and has no complaints at this time. Will continue to monitor. Maintained on 15 minute checks and observation by security camera for safety.

## 2016-01-30 NOTE — ED Notes (Signed)
Patient given meal tray.  Cooperative with all nursing interventions. TTS working on disposition.Maintained on 15 minute checks and observation by security camera for safety.

## 2016-01-30 NOTE — ED Notes (Signed)
Patient given meal tray and beverage. 

## 2016-01-30 NOTE — ED Notes (Signed)
Report received from Amy H., RN at this time.

## 2016-01-30 NOTE — ED Notes (Signed)
Patient more restless, irritable. Unhappy with being in the ED for extended time. Possibly having drug cravings although she denies this. Will continue to monitor clinical status and maintain on safety checks.

## 2016-01-31 MED ORDER — ACETAMINOPHEN 500 MG PO TABS
1000.0000 mg | ORAL_TABLET | Freq: Three times a day (TID) | ORAL | Status: DC | PRN
  Administered 2016-01-31 – 2016-02-01 (×3): 1000 mg via ORAL
  Administered 2016-02-02: 650 mg via ORAL
  Administered 2016-02-02 (×2): 1000 mg via ORAL
  Filled 2016-01-31 (×4): qty 2

## 2016-01-31 MED ORDER — ACETAMINOPHEN 500 MG PO TABS
ORAL_TABLET | ORAL | Status: AC
  Administered 2016-01-31: 1000 mg via ORAL
  Filled 2016-01-31: qty 2

## 2016-01-31 NOTE — ED Notes (Signed)
Patient is talking on phone, Patient is oriented, no signs of distress at this time. q 15 min. Checks and camera monitoring in progress.

## 2016-01-31 NOTE — ED Notes (Signed)
Patient is sleeping, she is safe, No signs of withdrawal, Patient without any behavioral issues, Patient without signs of labor as she is [redacted] weeks pregnant.Marland Kitchen. q 15 min. Checks, and camera surveillance at all times.

## 2016-01-31 NOTE — ED Notes (Signed)
Patient is alert and oriented, she states she wants to get off drugs and be a good mama for her children, she states she started on drugs 2 years ago, and she regrets ever starting, also that he whole entire family has addiction problems, she denies Si/hi or avh, she is willing to get help, she states she hopes to get in a rehab center as soon as possible. Patient is pleasant, will continue to monitor.

## 2016-01-31 NOTE — ED Notes (Addendum)
Patient is alert and oriented, up to the bathroom, Patient with camera surveillance in progress at all times, No signs of distress at this time or evidence of labor symptoms , Patient is [redacted] weeks pregnant.

## 2016-01-31 NOTE — ED Notes (Signed)
Pt. Alert and oriented, warm and dry, in no distress. Pt. Denies SI, HI, and AVH. Pt denies any labor pain during assessment. Patient states baby is actively moving. Pt. Encouraged to let nursing staff know of any concerns or needs.

## 2016-01-31 NOTE — ED Notes (Signed)

## 2016-01-31 NOTE — ED Notes (Signed)
Patient remained calm and cooperative this shift. Patient is currently lying in bed in no apparent distress at  this time. Will continue 15 minute checks and observation by security camera for safety.

## 2016-01-31 NOTE — ED Notes (Signed)
Patient alert and oriented, Patient is calm and cooperative, Patient is desiring help, states " I want to be a good mama" Patient denies Si/hi or avh. q 15 min.checks and camera surveillance at all times.

## 2016-01-31 NOTE — ED Notes (Signed)
Patient is complaining now of generalized pain, states she wants methodone, ED doctor states to wait on psychiatry, nurse let Patient know and she was calm and cooperative, patient does states that she feels hopeless and wants to give up, but denies plan to commit SI at this time. q 15 min.checks. And camera monitoring. .Marland Kitchen

## 2016-01-31 NOTE — ED Notes (Signed)
ED BHU PLACEMENT JUSTIFICATION Is the patient under IVC or is there intent for IVC: No. Is the patient medically cleared: Yes.   Is there vacancy in the ED BHU: Yes.   Is the population mix appropriate for patient: Yes.   Is the patient awaiting placement in inpatient or outpatient setting: No. Has the patient had a psychiatric consult: Yes.   Survey of unit performed for contraband, proper placement and condition of furniture, tampering with fixtures in bathroom, shower, and each patient room: Yes.  ; Findings: NA APPEARANCE/BEHAVIOR calm, cooperative and adequate rapport can be established NEURO ASSESSMENT Orientation: time, place and person Hallucinations: No.None noted (Hallucinations) Speech: Normal Gait: normal RESPIRATORY ASSESSMENT Normal expansion.  Clear to auscultation.  No rales, rhonchi, or wheezing. CARDIOVASCULAR ASSESSMENT regular rate and rhythm, S1, S2 normal, no murmur, click, rub or gallop GASTROINTESTINAL ASSESSMENT soft, nontender, BS WNL, no r/g EXTREMITIES normal strength, tone, and muscle mass PLAN OF CARE Provide calm/safe environment. Vital signs assessed twice daily. ED BHU Assessment once each 12-hour shift. Collaborate with intake RN daily or as condition indicates. Assure the ED provider has rounded once each shift. Provide and encourage hygiene. Provide redirection as needed. Assess for escalating behavior; address immediately and inform ED provider.  Assess family dynamic and appropriateness for visitation as needed: Yes.  ; If necessary, describe findings: NA Educate the patient/family about BHU procedures/visitation: Yes.  ; If necessary, describe findings: NA  

## 2016-01-31 NOTE — ED Provider Notes (Signed)
-----------------------------------------   7:05 AM on 01/31/2016 -----------------------------------------   Blood pressure 115/72, pulse 73, temperature 97.5 F (36.4 C), temperature source Oral, resp. rate 18, height 4\' 9"  (1.448 m), weight 150 lb (68.04 kg), SpO2 100 %.  The patient had no acute events since last update.  Calm and cooperative at this time.  Disposition is pending per Psychiatry/Behavioral Medicine team recommendations.     Myrna Blazeravid Matthew Ewart Carrera, MD 01/31/16 [phone]

## 2016-01-31 NOTE — ED Notes (Signed)
Patient was visually upset and sitting in floor in front of door. This Clinical research associatewriter spoke to patient. Patient states she is having high anxiety because of another patient pacing in the hallway. This Clinical research associatewriter got patient to focus and do deep breathing to calm down. Patient was able to calm down.

## 2016-01-31 NOTE — Consult Note (Signed)
  Psychiatry: Follow-up for this 25 year old woman with substance abuse issues who has been waiting now for a great many days for what was supposed to originally be a simple placement in a substance abuse residential program. Days of searching have gone into this and we have still not found a residential program for her. Patient is not under commitment. She has chosen to stay here voluntarily knowing that her risk of relapse is high outside the hospital. We did get an ultrasound done yesterday at the request of the alcohol and drug abuse treatment center in [place] and it showed that her pregnancy is actually more like 38 weeks in the 32 we have been told previously. This pain she is obviously much closer to actually delivering. Patient was given the option to either stay here over the weekend or go to her mother's house because it looks like we may have a bed for her at [facility] or a similar facility on Monday. She is choosing to stay here which I think is a reasonable thing to do given the high risk of relapse and no change to medication. Treatment team and nurses are aware of the situation.

## 2016-02-01 NOTE — ED Notes (Signed)
Snack tray given at this time with a ginger ale

## 2016-02-01 NOTE — ED Notes (Signed)
Patient alert and oriented, no s/s of distress, no signs of labor, she is [redacted] weeks pregnant, Nurse talked to Patient about contractions, or leakage, or any changes in how she feels, patient states that she is fine at this time, and that she has 2 other children and already knows what signs to be aware of. Patient is awaiting placement for help for drug abuse, she states ' I really want help" q 15 min. Checks and camera monitoring.

## 2016-02-01 NOTE — ED Provider Notes (Signed)
-----------------------------------------   11:28 PM on 02/01/2016 -----------------------------------------  Patient noted nursing staff she is having 8 out of 10 back pain. She is also to be [redacted] weeks pregnant on a recent ultrasound, 6 weeks later than expected for the patient. She currently has no prenatal care than follow-up health department.  I saw and evaluated the patient at this time. She is awake alert, she reports that her whole back feels achy, and she denies any pressure in her lower pelvis, contractions, loss of fluid, or concerns that would seem consistent with labor. However, given her [redacted] week gestation, and a plan for possibly discharged to a detox type facility tomorrow I have asked our OB GYN service to consult on her to assure that they do not find her to be in any early stage of labor and the recommendations for her ongoing care if discharged.  ----------------------------------------- 11:38 PM on 02/01/2016 -----------------------------------------  Due to the patient's 38 weeks with back pain, though she does not have any obvious evidence of being in active labor I have placed a consult with Dr. Tiburcio Pea of OB/GYN who will be willing to see her in about 30 minutes to make ongoing treatment recommendations and assure there is no sign of labor.  Ongoing care assigned to Dr. Lenard Lance, will follow-up on recommendations from OB/GYN. Patient is pending detox placement via psychiatry service, but also having an OB consult done this evening due to her pregnancy status with associated back pain tonight.  Sharyn Creamer, MD 02/01/16 [phone]

## 2016-02-01 NOTE — ED Notes (Signed)
Patient complaining of back pain 8/10. This RN offered tylenol 1,000 but patient refused stating "it doesn't help at all". Patient denies contractions, leaking of fluid, bleeding at this time. Dr. Sharyn Creamer notified. q 15 min. checks, and camera surveillance maintained.

## 2016-02-01 NOTE — ED Notes (Signed)
Patient took tylenol for back Pain. Patient watching tv at present time. Nurse will report off to oncoming nurse Luann RN.

## 2016-02-01 NOTE — Progress Notes (Signed)
Dr. Fanny Bien on unit at this time.

## 2016-02-01 NOTE — ED Notes (Signed)
Patient agreed to take tylenol 1000mg  for 8/10 pain but refused heat pack at this time. Medication administered.Patient instructed to notify this RN of any contractions, leaking of fluid or bleeding.Patient is currently lying in bed calmly watching television.Will continue to monitor.

## 2016-02-01 NOTE — ED Notes (Signed)
Patient is Vol and pending placement.

## 2016-02-01 NOTE — ED Provider Notes (Signed)
Progress note  7:13 AM 02/01/2016  Patient resting this morning still awaiting psych disposition.  Patient is medically stable  Leona Carry, MD 02/01/16 [phone]

## 2016-02-01 NOTE — ED Notes (Addendum)
Patient is pleasant and cooperative.  States minimal withdrawal symptoms.  FHT 132.  Denies pressure,uterine discomfort, or vaginal drainage.  Instructed to inform staff with any of theses symptoms.

## 2016-02-01 NOTE — ED Notes (Signed)
Patient in room, calm and cooperative with no s/s of distress. Patient denies bleeding, leaking of fluid or contractions at this time. No complaints or concerns voiced. No distress or abnormal behavior noted. Will continue to monitor with security cameras. Q 15 minute rounds continue.

## 2016-02-02 MED ORDER — ACETAMINOPHEN 325 MG PO TABS: 650.0000 mg | ORAL_TABLET | Freq: Once | ORAL | Status: DC

## 2016-02-02 NOTE — ED Notes (Signed)
Report given to Amy,RN at this time. 

## 2016-02-02 NOTE — ED Notes (Signed)
Pt moved to room 2 in main ed temporarily for OB evaluation.  Pt transported in wheelchair, no acute distress noted at this time.  Dr. Tiburcio Pea, OB at bedside

## 2016-02-02 NOTE — ED Notes (Signed)
Patient resting quietly in room. No noted distress or abnormal behaviors noted. Will continue 15 minute checks and observation by security camera for safety. 

## 2016-02-02 NOTE — ED Notes (Signed)
Per Dr. Tiburcio Pea, no dilation at this time, pt to be transported back to [facility] via [facility]

## 2016-02-02 NOTE — ED Notes (Signed)

## 2016-02-02 NOTE — ED Notes (Signed)
Patient remains calm and cooperative. No issues to report this shift. Patient is currently asleep in no apparent distress. Will continue Q15 minute checks and observation by security camera maintained.

## 2016-02-02 NOTE — ED Provider Notes (Signed)
-----------------------------------------   6:30 PM on 02/02/2016 -----------------------------------------  I have examined the patient because she has let the nurse noted that she continues to have a "achy" diffuse back pain that she has been having for several weeks. The back pain is not any different in character or severity. She denies any loss of fluid, vaginal bleeding. She does report normal fetal movement.  The patient was evaluated by Dr. Tiburcio Pea of OB/GYN yesterday, and no signs of active labor were noted.  On my examination, the patient is alert and oriented and ambulatory. She has a uterus that is gravid to just below the thoracic cage, without any evidence of contractions or pain on my exam.  For now, we will plan to give the patient Tylenol for her pain, I have recommended that she ambulate at least several minutes every hour. We will obtain fetal heart tones, and continue to monitor the patient. She has been given strict instructions about signs and symptoms which she should alert S2. The patient does verbalize understanding and contracts to let us know if anything changes.   Rockne Menghini, MD 02/02/16 [phone]

## 2016-02-02 NOTE — ED Notes (Signed)
Patient still experiencing pressure and lower back pain. She was instructed to alert staff immediately if having any contractions or any other signs of impending birth. Patient verbalizes understanding. Maintained on 15 minute checks and observation by security camera for safety.

## 2016-02-02 NOTE — Consult Note (Signed)
Obstetrics & Gynecology Consultation Note  Date of Consultation: 02/02/2016   Requesting Provider: [facility]  Primary OBGYN: None Primary Care Provider: No primary care provider on file.  Reason for Consultation: [redacted] weeks pregnant, Back Pain  History of Present Illness: Tara Raymond is a 25 y.o. [phone] (no custody) w uncertain LMP/EDC by dates but recent US placing her at 38 weeks, with the above CC. She is in ER for substance abuse (cocaine, opiods reported, cocaine MJ on UDS) and is being scheduled for treatment facility.  As US showed her to be 26 weeks, wanted to make sure she is not in imminent labor.  Pt denies VB, LOF, or ctxs.  Good FM.  Although scant PNV, reports no known problems this pregnancy.  Trace edema reported.  Denies h/o preclampsia, GDM, PTL, physical trauma.  Prior NSVD x2 (here), last one in 2015 by Dr Valentino Saxon.  She has had frequent FHT checks in ER with positive results.  ROS: A 12-point review of systems was performed and negative, except as stated in the above HPI.  OBGYN History: As per HPI. OB History    Gravida Para Term Preterm AB TAB SAB Ectopic Multiple Living   0 0 0 0 0 0 2      Past Medical History: History reviewed. No pertinent past medical history.  Past Surgical History: History reviewed. No pertinent past surgical history.  Family History:  No family history on file. She denies any female cancers, bleeding or blood clotting disorders.   Social History:  Social History   Social History  . Marital Status: Single    Spouse Name: N/A  . Number of Children: N/A  . Years of Education: N/A   Occupational History  . Not on file.   Social History Main Topics  . Smoking status: Raymond Every Day Smoker  . Smokeless tobacco: Not on file  . Alcohol Use: Not on file  . Drug Use: POS- Heroin and Cocaine and Cannibis  . Sexual Activity: POS, No concerns   Other Topics Concern  . Not on file   Social History Narrative  . No narrative  on file    Health Maintenance:   Allergy: No Known Allergies  Raymond Outpatient Medications:  (Not in a hospital admission)   Hospital Medications: Raymond Facility-Administered Medications  Medication Dose Route Frequency Provider Last Rate Last Dose  . acetaminophen (TYLENOL) tablet 1,000 mg  1,000 mg Oral TID PRN Tara Amel, MD   1,000 mg at 02/01/16 2328  . acetaminophen (TYLENOL) tablet 650 mg  650 mg Oral Once Tara Current, MD   650 mg at 01/29/16 1154  . cephALEXin (KEFLEX) capsule 500 mg  500 mg Oral Q12H Tara Rose, MD   500 mg at 02/01/16 2153   Raymond Outpatient Prescriptions  Medication Sig Dispense Refill  . cephALEXin (KEFLEX) 500 MG capsule Take 1 capsule (500 mg total) by mouth 2 (two) times daily. 14 capsule 0     Physical Exam: Filed Vitals:   02/01/16 0644 02/01/16 1458 02/01/16 1915 02/01/16 1920  BP: 110/69 124/81 106/64   Pulse: 78 86 78   Temp: 98 F (36.7 C) 97.9 F (36.6 C) 98.3 F (36.8 C)   TempSrc: Oral Oral Oral   Resp: Height:      Weight:      SpO2: 99% 100% 100% 100%    Temp:  [97.9 F (36.6 C)-98.3 F (36.8 C)] 98.3 F (36.8  C) (07/22 1915) Pulse Rate:  [78-86] 78 (07/22 1915) Resp:  [16-18] 18 (07/22 1915) BP: (106-124)/(64-81) 106/64 mmHg (07/22 1915) SpO2:  [99 %-100 %] 100 % (07/22 1920)     No intake or output data in the 24 hours ending 02/02/16 0008   Raymond Vital Signs 24h Vital Sign Ranges  T 98.3 F (36.8 C) Temp  Avg: 98.1 F (36.7 C)  Min: 97.9 F (36.6 C)  Max: 98.3 F (36.8 C)  BP 106/64 mmHg BP  Min: 106/64  Max: 124/81  HR 78 Pulse  Avg: 80.7  Min: 78  Max: 86  RR 18 Resp  Avg: 16.7  Min: 16  Max: 18  SaO2 100 % Not Delivered SpO2  Avg: 99.8 %  Min: 99 %  Max: 100 %       24 Hour I/O Raymond Shift I/O  Time Ins Outs       Patient Vitals for the past 8 hrs:  BP Temp Temp src Pulse Resp SpO2  02/01/16 1920 - - - - - 100 %  02/01/16 1915 106/64 mmHg 98.3 F (36.8 C) Oral 78  18 100 %    Body mass index is 32.45 kg/(m^2). General appearance: Well nourished, well developed female in no acute distress.  Neck:  Supple, normal appearance, and no thyromegaly  Cardiovascular:Regular rate and rhythm.  No murmurs, rubs or gallops. Respiratory:  Clear to auscultation bilateral. Normal respiratory effort Abdomen: positive bowel sounds and no masses, hernias; diffusely non tender to palpation, non distended Breasts: not examined. Neuro/Psych:  Normal mood and affect.  Skin:  Warm and dry.  Lymphatic:  No inguinal lymphadenopathy.   Pelvic exam: Is not limited by body habitus EGBUS: within normal limits, Vagina: within normal limits and with no blood in the vault,  Cervix:  Closed, thick and high Vtx station,  Uterus:  Fundus midline and approximately 38 week sized  Recent Labs Lab 01/28/16 1524  WBC 5.3  HGB 10.3*  HCT 31.8*  PLT 187    Recent Labs Lab 01/28/16 1524  NA 134*  K 3.0*  CL 102  CO2 25  BUN 6  CREATININE 0.55  CALCIUM 9.0  PROT 6.9  BILITOT 0.5  ALKPHOS 154*  ALT 53  AST 77*  GLUCOSE 100*   Imaging: EXAM: LIMITED OBSTETRIC ULTRASOUND  FINDINGS: Number of Fetuses: 1  Heart Rate: 135 bpm  Movement: Yes  Presentation: Cephalic  Placental Location: Anterior and right lateral  Previa: No  Amniotic Fluid (Subjective): Within normal limits.  AFI: 16.9 cm  BPD: 9.4cm 38w 2d  MATERNAL FINDINGS:  Cervix: Not well visualized  Uterus/Adnexae: No abnormality visualized.  IMPRESSION: Single living IUP at approximately 38 weeks. No acute maternal findings visualized.  This exam is performed on an emergent basis and does not comprehensively evaluate fetal size, dating, or anatomy; follow-up complete OB US should be considered if further fetal assessment is warranted.   Electronically Signed  By: Myles Rosenthal M.D.  On: 01/30/2016 19:53   Assessment: Ms. Guisti is a 25 y.o. G3P2 at 19 weeks  by recent US with limited PNC and h/o substance use/abuse who presented to the ED with complaints of  Above but now with some low back pain; findings are consistent with low back pain and NOT in labor at this time.  Plan: Stable for transfer, as long as possibility to return here or other appropriate facility for L&D care. Avoid all exposures to tobacco, cocaine, MJ, heroin, opiods. Prenatal Vitamin  recommended.  Annamarie Major, MD [facility] OBGYN Pager [phone]

## 2016-02-02 NOTE — ED Notes (Signed)
Patient c/o severe lower back pain. Patient also states she is having occasional "tightening" in abdominal area, unsure if these are contractions. Denies any discharge from vaginal area. Fetal heart rate is 132.  Seen by Dr. Sharma Covert. Patient will receive Tylenol for pain. She was encouraged to walk around the unit q one hour and alert staff of any changes in status. Continues on 15 minute checks.

## 2016-02-02 NOTE — ED Notes (Signed)

## 2016-02-02 NOTE — ED Notes (Signed)
Report was received from Amy H., RN; Pt. Verbalizes no complaints or distress; denies S.I./Hi. Continue to monitor with 15 min. Monitoring. 

## 2016-02-02 NOTE — Progress Notes (Signed)
Patient leaving BHU in wheelchair by this RN in stable condition to room 2 in main ed for temporary OB evaluation.

## 2016-02-02 NOTE — ED Provider Notes (Signed)
-----------------------------------------   8:08 AM on 02/02/2016 -----------------------------------------   Blood pressure 106/64, pulse 78, temperature 98.3 F (36.8 C), temperature source Oral, resp. rate 18, height 4\' 9"  (1.448 m), weight 150 lb (68 kg), SpO2 100 %.  The patient had no acute events since last update.  Calm and cooperative at this time.  Disposition is pending per Psychiatry/Behavioral Medicine team recommendations.     Jeanmarie Plant, MD 02/02/16 [phone]

## 2016-02-02 NOTE — ED Notes (Signed)
Patient received meal tray. She took a shower. Patient received PRN medications for back pain secondary to pregnancy.

## 2016-02-02 NOTE — ED Notes (Signed)
Patient awake, alert, and oriented. She is currently not experiencing any s/s labor. Maintained on 15 minute checks and observation by security camera for safety.

## 2016-02-02 NOTE — Progress Notes (Signed)
Patient to Tara Raymond from ED at this time. Patient is calm and cooperative and in no apparent distress. Q15 minute checks and camera surveillance maintained.

## 2016-02-03 ENCOUNTER — Encounter: Payer: Self-pay | Admitting: *Deleted

## 2016-02-03 LAB — URINALYSIS COMPLETE WITH MICROSCOPIC (ARMC ONLY)
BILIRUBIN URINE: NEGATIVE
Bacteria, UA: NONE SEEN
GLUCOSE, UA: NEGATIVE mg/dL
Hgb urine dipstick: NEGATIVE
KETONES UR: NEGATIVE mg/dL
Nitrite: NEGATIVE
Protein, ur: NEGATIVE mg/dL
Specific Gravity, Urine: 1.011 (ref 1.005–1.030)
pH: 6 (ref 5.0–8.0)

## 2016-02-03 NOTE — OB Triage Note (Signed)
Recvd from ED per wheelchair.  Report recvd from RN.  C/O abdominal pain.  To bed and EFM applied.  Plan of care discussed.  Oriented to room.

## 2016-02-03 NOTE — Progress Notes (Signed)
Attempted to contact [facility]: [facility] March voice mail, left message for update on referral. Geanine Vandekamp K. Sherlon Handing, LPC-A, [facility]  Counselor 02/03/2016 9:28 AM

## 2016-02-03 NOTE — Progress Notes (Signed)
Contacted [facility] Coord[facility] [place], states she will contact [facility] this evening with intake person and call back to TTS. Xayden Linsey K. Sherlon Handing, LPC-A, [facility]  Counselor 02/03/2016 9:46 AM

## 2016-02-03 NOTE — Progress Notes (Signed)
Labor and Delivery Progress Note  Subjective:  Patient presents from ER with complaint of a gush of fluid and right flank pain.  She noted a gush of mucusy fluid several hours ago. She has had no continued leakage of fluid. There is no associated blood.  She was examined in the ER by Dr. Webster and a white mucusy discharge was seen with no obvious think clear or pink-tinged fluid.  She notes +FM, no contractions.  She started having right flank pain, which has subsided at this point.  She believes it is due to the positioning of the baby.  Objective: BP 127/72 (BP Location: Left Arm)   Pulse 87   Temp 98.1 F (36.7 C) (Oral)   Resp 16   Ht 4' 9" (1.448 m)   Wt 68 kg (150 lb)   SpO2 100%   BMI 32.46 kg/m   Gen: disheveled, NAD Resp: moves air well. Abd; soft, nontender, gravid, no rebound, no guarding. Ext: no e/c/t  EFM: 135/+accels/no decels/mod variabiility Toco: no ctx.  Assessment and plan: 25 y.o. G3P2002 female at [redacted]w[redacted]d by recent BPD on ultrasound. No apparent ROM.  Right flank pain resolved.  Return to ER for dispo. Patient to follow up tomorrow, if not in hospital.  Labor precautions and ROM precautions given.  Griselda Tosh, MD 02/03/2016 11:47 AM   

## 2016-02-03 NOTE — ED Notes (Signed)

## 2016-02-03 NOTE — ED Notes (Signed)
Pt back from L&D after evaluation. Pt to room 21 via wheelchair.

## 2016-02-03 NOTE — ED Notes (Signed)
Pt continues to complain of abdominal pain; pain discussed with Dr. Derrill Kay; pt being sent to [facility] for evaluation.

## 2016-02-03 NOTE — Progress Notes (Signed)
Attempted to contact [facility] [place] no answer left voicemail with call back information. Also attempted to call after hours line as suggested at [phone], no answer. Ascencion Stegner K. Sherlon Handing, LPC-A, [facility]  Counselor 02/03/2016 9:34 AM

## 2016-02-03 NOTE — ED Notes (Signed)
Patient states "this is the first time I have had a nap today, please lower the bed." Bed lowered after fetal heart tones taken. WIll continue to monitor.

## 2016-02-03 NOTE — Progress Notes (Signed)
Contacted [place], [place] spoek with [facility] who stated she will call back and has to check on referral. Dominique Ressel K. Sherlon Handing, LPC-A, [facility]  Counselor 02/03/2016 9:38 AM

## 2016-02-03 NOTE — ED Provider Notes (Signed)
-----------------------------------------   6:55 AM on 02/03/2016 -----------------------------------------   Blood pressure (!) 102/47, pulse 81, temperature 98.5 F (36.9 C), temperature source Oral, resp. rate 16, height 4\' 9"  (1.448 m), weight 150 lb (68 kg), SpO2 100 %.  The patient had no acute events since last update.  Calm and cooperative at this time.  Disposition is pending per Psychiatry/Behavioral Medicine team recommendations.  I received a phone call from the behavioral health unit that the patient was feeling some fluid coming from her vagina. She was brought from the [facility] over to room 2. I did evaluate the patient's vaginal area. I did not do a cervical check but noticed she had some white appearing vaginal discharge. There was no watery fluid coming from her vagina when she bore down. The patient reports that she was having some right upper quadrant abdominal pain but she still had good fetal heart tones. I contacted Dr. Tiburcio Pea who had seen the patient yesterday. He reports that we should continue to monitor the patient in the emergency department and if her pain seems to be continuing or progressing and she should be transferred upstairs to labor and delivery for evaluation. The patient is still awaiting social work evaluation and placement.   Rebecka Apley, MD 02/03/16 [phone]

## 2016-02-03 NOTE — ED Notes (Signed)
Pt brought to room 2 by bhu RN margaret, pt states that she felt some "jelly come out" pt has vaginal mucous appearing substance on her fingers. Pt assisted into the bed, no obvious leaking fluid or bleeding noted, no crowning or signs of active labor noted at this time, Dr Zenda Alpers aware. Cont to monitor

## 2016-02-03 NOTE — Progress Notes (Signed)
Contacted [facility], Per [employer] pt is on priority and at top of list for placement when bed is available. Kinberly Perris K. Sherlon Handing, LPC-A, [facility]  Counselor 02/03/2016 9:42 AM

## 2016-02-03 NOTE — Progress Notes (Signed)
Labor and Delivery Progress Note  Subjective:  Patient presents from ER with complaint of a gush of fluid and right flank pain.  She noted a gush of mucusy fluid several hours ago. She has had no continued leakage of fluid. There is no associated blood.  She was examined in the ER by Dr. Zenda Alpers and a white mucusy discharge was seen with no obvious think clear or pink-tinged fluid.  She notes +FM, no contractions.  She started having right flank pain, which has subsided at this point.  She believes it is due to the positioning of the baby.  Objective: BP 127/72 (BP Location: Left Arm)   Pulse 87   Temp 98.1 F (36.7 C) (Oral)   Resp 16   Ht 4\' 9"  (1.448 m)   Wt 68 kg (150 lb)   SpO2 100%   BMI 32.46 kg/m   Gen: disheveled, NAD Resp: moves air well. Abd; soft, nontender, gravid, no rebound, no guarding. Ext: no e/c/t  EFM: 135/+accels/no decels/mod variabiility Toco: no ctx.  Assessment and plan: 25 y.o. G3P2002 female at [redacted]w[redacted]d by recent BPD on ultrasound. No apparent ROM.  Right flank pain resolved.  Return to ER for dispo. Patient to follow up tomorrow, if not in hospital.  Labor precautions and ROM precautions given.  Thomasene Mohair, MD 02/03/2016 11:47 AM

## 2016-02-03 NOTE — Progress Notes (Signed)
Transport to ED per wheelchair for completion of evaluation.

## 2016-02-03 NOTE — ED Notes (Signed)
Patient brought over from [facility] again due to complaint of "feeling like some jelly came out." Patient with mucus on her hands. Patient previously checked by Dr. Tiburcio Pea on 7/22 at midnight for complaint of back pain. Patient again to ED for check by MD to rule out active labor.

## 2016-02-04 NOTE — Progress Notes (Signed)
Obstetric and Gynecology  Subjective  Doing doing well she is unsure, why she was sent upstairs.  She denies any contractions or pain currently. +FM, no LOF, no VB  Objective   Vitals:   02/04/16 0607 02/04/16 0730  BP:  106/85  Pulse:  75  Resp:  14  Temp: 97.9 F (36.6 C) 97.9 F (36.6 C)    No intake or output data in the 24 hours ending 02/04/16 0801  General: NAD Pulmonary: no increased work of breathing Abdomen: gravid, non-tender, non-distended Extremities: no edema  FHT: 120, moderate, +accels, no decels Toco: none  Labs: Results for orders placed or performed during the hospital encounter of 01/28/16 (from the past 24 hour(s))  Urinalysis complete, with microscopic ([facility] only)     Status: Abnormal   Collection Time: 02/03/16 11:20 AM  Result Value Ref Range   Color, Urine YELLOW (A) YELLOW   APPearance CLEAR (A) CLEAR   Glucose, UA NEGATIVE NEGATIVE mg/dL   Bilirubin Urine NEGATIVE NEGATIVE   Ketones, ur NEGATIVE NEGATIVE mg/dL   Specific Gravity, Urine 1.011 1.005 - 1.030   Hgb urine dipstick NEGATIVE NEGATIVE   pH 6.0 5.0 - 8.0   Protein, ur NEGATIVE NEGATIVE mg/dL   Nitrite NEGATIVE NEGATIVE   Leukocytes, UA 2+ (A) NEGATIVE   RBC / HPF 0-5 0 - 5 RBC/hpf   WBC, UA 6-30 0 - 5 WBC/hpf   Bacteria, UA NONE SEEN NONE SEEN   Squamous Epithelial / LPF 6-30 (A) NONE SEEN   Mucous PRESENT     Cultures: Results for orders placed or performed during the hospital encounter of 01/28/16  Urine culture     Status: Abnormal   Collection Time: 01/28/16  5:00 PM  Result Value Ref Range Status   Specimen Description URINE, RANDOM  Final   Special Requests Normal  Final   Culture MULTIPLE SPECIES PRESENT, SUGGEST RECOLLECTION (A)  Final   Report Status 01/30/2016 FINAL  Final    Imaging:  Assessment   25 y.o. [id number] at [redacted]w[redacted]d with no prenatal care currently being monitored in ER and awaiting transfer to substance abuse treatment facility  Plan    1) No  evidence of labor on monitoring  2) Fetus - category I tracing  3) Disposition - stable for transfer back to ED to await placement

## 2016-02-04 NOTE — ED Notes (Signed)
Patient belongings given to patient.  Patient stated that there was a second bag of clothing in locker room. [facility] locker room searched for second bag of clothing, unable to find.  Patient stated she would discharge home without second bag.

## 2016-02-04 NOTE — ED Notes (Signed)
Patient up for discharge and patient's ride at the hospital.  Patient stated she did not want to wait for discharge instructions.  Patient encouraged to wait for discharge instructions but patient insistant that she must leave immediately due to ride time constraints.  Patient signed d/c electronically.  Patient ambulating without difficulty.

## 2016-02-04 NOTE — BH Assessment (Signed)
Writer called an informed the patient's community advocate, with [facility] [place] (Bria Miller-[phone]), the patient has discharged from the ER, in order that she can follow up with her in the community.

## 2016-02-04 NOTE — ED Notes (Signed)
Patient in room getting dressed for discharge at this time. 

## 2016-02-04 NOTE — Progress Notes (Signed)
Received per report from off-going shift.  Resting in bed with eyes closed.  Responds to voice.  States she has no pain and is not leaking fluid or bleeding.  FHTs reactive.  Uterine resting tone soft. Will continue to monitor.  No c/o.  Plan of care discussed, verbalized understanding and agreement with plan.

## 2016-02-04 NOTE — ED Notes (Signed)
Tara Raymond (DSS) called to notify of patient's pending discharge.  Voice mail message left at [phone].

## 2016-02-04 NOTE — ED Provider Notes (Signed)
-----------------------------------------   9:57 AM on 02/04/2016 -----------------------------------------   Blood pressure 106/85, pulse 75, temperature 97.9 F (36.6 C), temperature source Oral, resp. rate 14, height 4\' 9"  (1.448 m), weight 150 lb (68 kg), SpO2 98 %.  Assuming care from Dr. Zenda Alpers of [facility] is a 25 y.o. female with a chief complaint of Drug Problem; Abdominal Pain; and Rupture of Membranes (pt c/o of leaking fluid) .    Patient returned from labor and delivery. Patient with no evidence of rupture membranes. Toco with no contractions. Patient was evaluated by OB and not in labor. Fetal monitoring extremely reassuring.  Patient was sent back to the emergency department. Patient is here voluntarily awaiting placement but decides that she needs to go home. I have offered to call [facility] to try to transfer patient to the psychiatric facility for pregnant women however she has refused at this time. Patient denies any contractions, abdominal pain, or vaginal leaking. I have referred her to OB/GYN for follow-up. Will discharge patient at this time.   Nita Sickle, MD 02/04/16 [phone]

## 2016-02-04 NOTE — Discharge Instructions (Signed)
Follow-up with OB/GYN. Return to the emergency department if you're having contractions and is unable to get into OB/GYN, if you have a gush of fluid through her vagina, or if any new symptoms occur.

## 2016-02-04 NOTE — OB Triage Note (Signed)
Pt arrived to obs rm 2 from ED with c/o leaking fluid and lower abdominal pain. Pt placed on monitors and oriented to room.

## 2016-02-04 NOTE — ED Notes (Signed)
Patient returns from L+D.  AAOx3.  Calm and cooperative.  Patient states that she wishes to discharge home today, stating that "She has some things to take care of and she has family court on Wednesday", which she must attend.  Dr. Don Perking notified of patient's wish to discharge and will go and speak with patient.

## 2016-02-09 ENCOUNTER — Encounter
Admission: EM | Disposition: A | Discharge status: Home / Self Care | Payer: Self-pay | Source: Home / Self Care | Attending: Obstetrics & Gynecology

## 2016-02-09 ENCOUNTER — Inpatient Hospital Stay
Admission: EM | Admit: 2016-02-09 | Discharge: 2016-02-12 | DRG: 765 | Disposition: A | Discharge status: Home / Self Care | Payer: Medicaid Other | Attending: Obstetrics & Gynecology | Admitting: Obstetrics & Gynecology

## 2016-02-09 ENCOUNTER — Inpatient Hospital Stay: Payer: Medicaid Other | Admitting: Anesthesiology

## 2016-02-09 DIAGNOSIS — Z3A39 39 weeks gestation of pregnancy: Secondary | ICD-10-CM

## 2016-02-09 DIAGNOSIS — O99334 Smoking (tobacco) complicating childbirth: Secondary | ICD-10-CM | POA: Diagnosis present

## 2016-02-09 DIAGNOSIS — F1111 Opioid abuse, in remission: Secondary | ICD-10-CM | POA: Diagnosis present

## 2016-02-09 DIAGNOSIS — O99344 Other mental disorders complicating childbirth: Secondary | ICD-10-CM | POA: Diagnosis present

## 2016-02-09 DIAGNOSIS — F1410 Cocaine abuse, uncomplicated: Secondary | ICD-10-CM | POA: Diagnosis present

## 2016-02-09 DIAGNOSIS — D62 Acute posthemorrhagic anemia: Secondary | ICD-10-CM | POA: Diagnosis present

## 2016-02-09 DIAGNOSIS — O99324 Drug use complicating childbirth: Principal | ICD-10-CM | POA: Diagnosis present

## 2016-02-09 DIAGNOSIS — F17200 Nicotine dependence, unspecified, uncomplicated: Secondary | ICD-10-CM | POA: Diagnosis present

## 2016-02-09 DIAGNOSIS — F4321 Adjustment disorder with depressed mood: Secondary | ICD-10-CM | POA: Diagnosis present

## 2016-02-09 DIAGNOSIS — O9902 Anemia complicating childbirth: Secondary | ICD-10-CM | POA: Diagnosis present

## 2016-02-09 DIAGNOSIS — Z3A38 38 weeks gestation of pregnancy: Secondary | ICD-10-CM | POA: Diagnosis not present

## 2016-02-09 DIAGNOSIS — F1994 Other psychoactive substance use, unspecified with psychoactive substance-induced mood disorder: Secondary | ICD-10-CM | POA: Diagnosis present

## 2016-02-09 DIAGNOSIS — O99323 Drug use complicating pregnancy, third trimester: Secondary | ICD-10-CM | POA: Diagnosis present

## 2016-02-09 DIAGNOSIS — F1110 Opioid abuse, uncomplicated: Secondary | ICD-10-CM | POA: Diagnosis present

## 2016-02-09 LAB — TYPE AND SCREEN
ABO/RH(D): A POS
ANTIBODY SCREEN: NEGATIVE

## 2016-02-09 LAB — GLUCOSE, CAPILLARY: Glucose-Capillary: 136 mg/dL — ABNORMAL HIGH (ref 65–99)

## 2016-02-09 LAB — COMPREHENSIVE METABOLIC PANEL
ALBUMIN: 2.6 g/dL — AB (ref 3.5–5.0)
ALT: 47 U/L (ref 14–54)
ANION GAP: 8 (ref 5–15)
AST: 73 U/L — AB (ref 15–41)
Alkaline Phosphatase: 195 U/L — ABNORMAL HIGH (ref 38–126)
BILIRUBIN TOTAL: 0.4 mg/dL (ref 0.3–1.2)
CHLORIDE: 104 mmol/L (ref 101–111)
CO2: 23 mmol/L (ref 22–32)
Calcium: 8.2 mg/dL — ABNORMAL LOW (ref 8.9–10.3)
Creatinine, Ser: 0.56 mg/dL (ref 0.44–1.00)
GFR calc Af Amer: >60 mL/min (ref >60)
GFR calc non Af Amer: >60 mL/min (ref >60)
GLUCOSE: 109 mg/dL — AB (ref 65–99)
POTASSIUM: 3.2 mmol/L — AB (ref 3.5–5.1)
SODIUM: 135 mmol/L (ref 135–145)
TOTAL PROTEIN: 6.4 g/dL — AB (ref 6.5–8.1)

## 2016-02-09 LAB — CBC
HCT: 27 % — ABNORMAL LOW (ref 35.0–47.0)
HEMATOCRIT: 22.5 % — AB (ref 35.0–47.0)
HEMOGLOBIN: 9.1 g/dL — AB (ref 12.0–16.0)
HEMOGLOBIN: 7.2 g/dL — AB (ref 12.0–16.0)
MCH: 27.2 pg (ref 26.0–34.0)
MCH: 26.6 pg (ref 26.0–34.0)
MCHC: 33.7 g/dL (ref 32.0–36.0)
MCHC: 32.2 g/dL (ref 32.0–36.0)
MCV: 80.7 fL (ref 80.0–100.0)
MCV: 82.6 fL (ref 80.0–100.0)
Platelets: 189 10*3/uL (ref 150–440)
Platelets: 185 10*3/uL (ref 150–440)
RBC: 3.34 MIL/uL — AB (ref 3.80–5.20)
RBC: 2.73 MIL/uL — ABNORMAL LOW (ref 3.80–5.20)
RDW: 15.5 % — ABNORMAL HIGH (ref 11.5–14.5)
RDW: 15.7 % — AB (ref 11.5–14.5)
WBC: 10.1 10*3/uL (ref 3.6–11.0)
WBC: 16.4 10*3/uL — AB (ref 3.6–11.0)

## 2016-02-09 LAB — DIFFERENTIAL
BASOS ABS: 0 10*3/uL (ref 0–0.1)
Basophils Relative: 0 %
EOS PCT: 0 %
Eosinophils Absolute: 0 10*3/uL (ref 0–0.7)
LYMPHS PCT: 26 %
Lymphs Abs: 2.6 10*3/uL (ref 1.0–3.6)
MONO ABS: 0.9 10*3/uL (ref 0.2–0.9)
MONOS PCT: 9 %
NEUTROS ABS: 6.5 10*3/uL (ref 1.4–6.5)
NEUTROS PCT: 65 %

## 2016-02-09 LAB — RAPID HIV SCREEN (HIV 1/2 AB+AG)
HIV 1/2 Antibodies: NONREACTIVE
HIV-1 P24 ANTIGEN - HIV24: NONREACTIVE

## 2016-02-09 LAB — URINE DRUG SCREEN, QUALITATIVE (ARMC ONLY)
Amphetamines, Ur Screen: NOT DETECTED
BARBITURATES, UR SCREEN: NOT DETECTED
BENZODIAZEPINE, UR SCRN: NOT DETECTED
CANNABINOID 50 NG, UR ~~LOC~~: POSITIVE — AB
Cocaine Metabolite,Ur ~~LOC~~: POSITIVE — AB
MDMA (Ecstasy)Ur Screen: NOT DETECTED
Methadone Scn, Ur: NOT DETECTED
Opiate, Ur Screen: NOT DETECTED
Phencyclidine (PCP) Ur S: NOT DETECTED
TRICYCLIC, UR SCREEN: NOT DETECTED

## 2016-02-09 SURGERY — Surgical Case
Anesthesia: Regional | Wound class: Clean Contaminated

## 2016-02-09 MED ORDER — SIMETHICONE 80 MG PO CHEW: 80.0000 mg | CHEWABLE_TABLET | ORAL | Status: DC | PRN

## 2016-02-09 MED ORDER — HYDROXYZINE HCL 50 MG PO TABS
50.0000 mg | ORAL_TABLET | Freq: Four times a day (QID) | ORAL | Status: DC | PRN
  Administered 2016-02-09: 50 mg via ORAL
  Filled 2016-02-09 (×3): qty 1

## 2016-02-09 MED ORDER — KETOROLAC TROMETHAMINE 30 MG/ML IJ SOLN
30.0000 mg | Freq: Four times a day (QID) | INTRAMUSCULAR | Status: AC
  Administered 2016-02-09 – 2016-02-10 (×4): 30 mg via INTRAVENOUS
  Filled 2016-02-09 (×3): qty 1

## 2016-02-09 MED ORDER — HYDROMORPHONE HCL 1 MG/ML IJ SOLN
0.2500 mg | INTRAMUSCULAR | Status: DC | PRN
  Administered 2016-02-09 (×2): 0.25 mg via INTRAVENOUS

## 2016-02-09 MED ORDER — PRENATAL MULTIVITAMIN CH
1.0000 | ORAL_TABLET | Freq: Every day | ORAL | Status: DC
  Administered 2016-02-11: 1 via ORAL
  Filled 2016-02-09 (×2): qty 1

## 2016-02-09 MED ORDER — TERBUTALINE SULFATE 1 MG/ML IJ SOLN
0.2500 mg | Freq: Once | INTRAMUSCULAR | Status: DC | PRN
Start: 2016-02-09 — End: 2016-02-09

## 2016-02-09 MED ORDER — ACETAMINOPHEN 325 MG PO TABS: 650.0000 mg | ORAL_TABLET | ORAL | Status: DC | PRN

## 2016-02-09 MED ORDER — BUPIVACAINE IN DEXTROSE 0.75-8.25 % IT SOLN
INTRATHECAL | Status: DC | PRN
  Administered 2016-02-09: 1.5 mL via INTRATHECAL

## 2016-02-09 MED ORDER — WITCH HAZEL-GLYCERIN EX PADS: 1.0000 "application " | MEDICATED_PAD | CUTANEOUS | Status: DC | PRN

## 2016-02-09 MED ORDER — SENNOSIDES-DOCUSATE SODIUM 8.6-50 MG PO TABS
2.0000 | ORAL_TABLET | ORAL | Status: DC
  Administered 2016-02-11: 2 via ORAL
  Filled 2016-02-09: qty 2

## 2016-02-09 MED ORDER — ONDANSETRON HCL 4 MG/2ML IJ SOLN: 4.0000 mg | Freq: Once | INTRAMUSCULAR | Status: DC | PRN

## 2016-02-09 MED ORDER — ONDANSETRON HCL 4 MG/2ML IJ SOLN: 4.0000 mg | Freq: Four times a day (QID) | INTRAMUSCULAR | Status: DC | PRN

## 2016-02-09 MED ORDER — DIPHENHYDRAMINE HCL 25 MG PO CAPS: 25.0000 mg | ORAL_CAPSULE | Freq: Four times a day (QID) | ORAL | Status: DC | PRN

## 2016-02-09 MED ORDER — FENTANYL CITRATE (PF) 100 MCG/2ML IJ SOLN: 25.0000 ug | INTRAMUSCULAR | Status: DC | PRN

## 2016-02-09 MED ORDER — OXYTOCIN 40 UNITS IN LACTATED RINGERS INFUSION - SIMPLE MED
2.5000 [IU]/h | INTRAVENOUS | Status: DC
  Administered 2016-02-09: 40 mL via INTRAVENOUS
  Filled 2016-02-09: qty 1000

## 2016-02-09 MED ORDER — CEFOXITIN SODIUM-DEXTROSE 2-2.2 GM-% IV SOLR (PREMIX)
2.0000 g | INTRAVENOUS | Status: AC
  Administered 2016-02-09: 2000 mg via INTRAVENOUS

## 2016-02-09 MED ORDER — SIMETHICONE 80 MG PO CHEW
80.0000 mg | CHEWABLE_TABLET | Freq: Three times a day (TID) | ORAL | Status: DC
Start: 2016-02-09 — End: 2016-02-12
  Administered 2016-02-10 – 2016-02-12 (×5): 80 mg via ORAL
  Filled 2016-02-09 (×6): qty 1

## 2016-02-09 MED ORDER — NALOXONE HCL 0.4 MG/ML IJ SOLN: 0.4000 mg | Freq: Once | INTRAMUSCULAR | Status: DC

## 2016-02-09 MED ORDER — LACTATED RINGERS IV SOLN
INTRAVENOUS | Status: DC
  Administered 2016-02-09 (×2): via INTRAVENOUS

## 2016-02-09 MED ORDER — CEFOXITIN SODIUM-DEXTROSE 2-2.2 GM-% IV SOLR (PREMIX)
INTRAVENOUS | Status: AC
  Administered 2016-02-09: 2000 mg via INTRAVENOUS
  Filled 2016-02-09: qty 50

## 2016-02-09 MED ORDER — ACETAMINOPHEN 10 MG/ML IV SOLN
1000.0000 mg | Freq: Four times a day (QID) | INTRAVENOUS | Status: DC
  Administered 2016-02-09: 1000 mg via INTRAVENOUS
  Filled 2016-02-09 (×4): qty 100

## 2016-02-09 MED ORDER — ACETAMINOPHEN 10 MG/ML IV SOLN
1000.0000 mg | Freq: Four times a day (QID) | INTRAVENOUS | Status: AC
  Administered 2016-02-10 (×2): 1000 mg via INTRAVENOUS
  Filled 2016-02-09 (×4): qty 100

## 2016-02-09 MED ORDER — TETANUS-DIPHTH-ACELL PERTUSSIS 5-2.5-18.5 LF-MCG/0.5 IM SUSP: 0.5000 mL | Freq: Once | INTRAMUSCULAR | Status: DC

## 2016-02-09 MED ORDER — ALPRAZOLAM 0.5 MG PO TABS
0.2500 mg | ORAL_TABLET | Freq: Two times a day (BID) | ORAL | Status: DC | PRN
  Administered 2016-02-12: 0.25 mg via ORAL
  Filled 2016-02-09: qty 1

## 2016-02-09 MED ORDER — LACTATED RINGERS IV BOLUS (SEPSIS): 1000.0000 mL | Freq: Once | INTRAVENOUS | Status: DC

## 2016-02-09 MED ORDER — SOD CITRATE-CITRIC ACID 500-334 MG/5ML PO SOLN
ORAL | Status: AC
  Administered 2016-02-09: 30 mL via ORAL
  Filled 2016-02-09: qty 15

## 2016-02-09 MED ORDER — OXYTOCIN 40 UNITS IN LACTATED RINGERS INFUSION - SIMPLE MED
2.5000 [IU]/h | INTRAVENOUS | Status: AC
  Filled 2016-02-09: qty 1000

## 2016-02-09 MED ORDER — ZOLPIDEM TARTRATE 5 MG PO TABS: 5.0000 mg | ORAL_TABLET | Freq: Every evening | ORAL | Status: DC | PRN

## 2016-02-09 MED ORDER — MENTHOL 3 MG MT LOZG
1.0000 | LOZENGE | OROMUCOSAL | Status: DC | PRN
  Filled 2016-02-09: qty 9

## 2016-02-09 MED ORDER — POTASSIUM CHLORIDE CRYS ER 20 MEQ PO TBCR
20.0000 meq | EXTENDED_RELEASE_TABLET | Freq: Two times a day (BID) | ORAL | Status: DC
  Administered 2016-02-09: 20 meq via ORAL
  Filled 2016-02-09: qty 1

## 2016-02-09 MED ORDER — DEXTROSE 5 % IV SOLN
INTRAVENOUS | Status: DC | PRN
  Administered 2016-02-09: 2 g via INTRAVENOUS

## 2016-02-09 MED ORDER — LACTATED RINGERS IV SOLN
INTRAVENOUS | Status: DC
  Administered 2016-02-09 – 2016-02-11 (×7): via INTRAVENOUS

## 2016-02-09 MED ORDER — KETOROLAC TROMETHAMINE 30 MG/ML IJ SOLN
INTRAMUSCULAR | Status: AC
  Administered 2016-02-09: 30 mg via INTRAVENOUS
  Filled 2016-02-09: qty 1

## 2016-02-09 MED ORDER — SOD CITRATE-CITRIC ACID 500-334 MG/5ML PO SOLN
30.0000 mL | ORAL | Status: AC
  Administered 2016-02-09: 30 mL via ORAL

## 2016-02-09 MED ORDER — HYDROMORPHONE HCL 1 MG/ML IJ SOLN
INTRAMUSCULAR | Status: AC
  Filled 2016-02-09: qty 1

## 2016-02-09 MED ORDER — PHENYLEPHRINE HCL 10 MG/ML IJ SOLN
INTRAMUSCULAR | Status: DC | PRN
  Administered 2016-02-09 (×3): 100 ug via INTRAVENOUS

## 2016-02-09 MED ORDER — BUTORPHANOL TARTRATE 1 MG/ML IJ SOLN: 1.0000 mg | INTRAMUSCULAR | Status: DC | PRN

## 2016-02-09 MED ORDER — IBUPROFEN 600 MG PO TABS
600.0000 mg | ORAL_TABLET | Freq: Four times a day (QID) | ORAL | Status: DC
  Administered 2016-02-10 – 2016-02-12 (×8): 600 mg via ORAL
  Filled 2016-02-09 (×8): qty 1

## 2016-02-09 MED ORDER — SODIUM CHLORIDE FLUSH 0.9 % IV SOLN
INTRAVENOUS | Status: AC
  Filled 2016-02-09: qty 30

## 2016-02-09 MED ORDER — NICOTINE 14 MG/24HR TD PT24: 14.0000 mg | MEDICATED_PATCH | Freq: Every day | TRANSDERMAL | Status: DC | PRN

## 2016-02-09 MED ORDER — LACTATED RINGERS IV SOLN: 500.0000 mL | INTRAVENOUS | Status: DC | PRN

## 2016-02-09 MED ORDER — DIBUCAINE 1 % RE OINT: 1.0000 "application " | TOPICAL_OINTMENT | RECTAL | Status: DC | PRN

## 2016-02-09 MED ORDER — SIMETHICONE 80 MG PO CHEW
80.0000 mg | CHEWABLE_TABLET | ORAL | Status: DC
  Filled 2016-02-09: qty 1

## 2016-02-09 MED ORDER — OXYTOCIN 40 UNITS IN LACTATED RINGERS INFUSION - SIMPLE MED
1.0000 m[IU]/min | INTRAVENOUS | Status: DC
  Administered 2016-02-09: 1 m[IU]/min via INTRAVENOUS

## 2016-02-09 MED ORDER — OXYCODONE-ACETAMINOPHEN 5-325 MG PO TABS: 1.0000 | ORAL_TABLET | ORAL | Status: DC | PRN

## 2016-02-09 MED ORDER — OXYCODONE-ACETAMINOPHEN 5-325 MG PO TABS
2.0000 | ORAL_TABLET | ORAL | Status: DC | PRN
  Administered 2016-02-10 – 2016-02-12 (×12): 2 via ORAL
  Filled 2016-02-09 (×13): qty 2

## 2016-02-09 MED ORDER — MIDAZOLAM HCL 2 MG/2ML IJ SOLN
INTRAMUSCULAR | Status: DC | PRN
  Administered 2016-02-09: 1 mg via INTRAVENOUS

## 2016-02-09 MED ORDER — MORPHINE SULFATE (PF) 2 MG/ML IV SOLN
1.0000 mg | INTRAVENOUS | Status: DC | PRN
  Administered 2016-02-09 – 2016-02-10 (×2): 1 mg via INTRAVENOUS
  Filled 2016-02-09 (×2): qty 1

## 2016-02-09 MED ORDER — SODIUM CHLORIDE 0.9 % IV SOLN
2.0000 g | Freq: Once | INTRAVENOUS | Status: AC
  Administered 2016-02-09: 2 g via INTRAVENOUS
  Filled 2016-02-09: qty 2000

## 2016-02-09 MED ORDER — NALOXONE HCL 0.4 MG/ML IJ SOLN
INTRAMUSCULAR | Status: AC
  Filled 2016-02-09: qty 1

## 2016-02-09 MED ORDER — NALOXONE HCL 0.4 MG/ML IJ SOLN: 0.2000 mg | Freq: Once | INTRAMUSCULAR | Status: DC

## 2016-02-09 MED ORDER — OXYTOCIN BOLUS FROM INFUSION: 500.0000 mL | Freq: Once | INTRAVENOUS | Status: DC

## 2016-02-09 MED ORDER — FENTANYL CITRATE (PF) 100 MCG/2ML IJ SOLN
INTRAMUSCULAR | Status: DC | PRN
  Administered 2016-02-09: 20 ug via INTRATHECAL

## 2016-02-09 MED ORDER — COCONUT OIL OIL: 1.0000 "application " | TOPICAL_OIL | Status: DC | PRN

## 2016-02-09 SURGICAL SUPPLY — 21 items
CANISTER SUCT 3000ML (MISCELLANEOUS) ×3 IMPLANT
CATH KIT ON-Q SILVERSOAK 5IN (CATHETERS) IMPLANT
CHLORAPREP W/TINT 26ML (MISCELLANEOUS) ×6 IMPLANT
ELECT CAUTERY BLADE 6.4 (BLADE) ×3 IMPLANT
ELECT REM PT RETURN 9FT ADLT (ELECTROSURGICAL) ×3
ELECTRODE REM PT RTRN 9FT ADLT (ELECTROSURGICAL) ×1 IMPLANT
GLOVE SKINSENSE NS SZ8.0 LF (GLOVE) ×2
GLOVE SKINSENSE STRL SZ8.0 LF (GLOVE) ×1 IMPLANT
GOWN STRL REUS W/ TWL LRG LVL3 (GOWN DISPOSABLE) ×1 IMPLANT
GOWN STRL REUS W/ TWL XL LVL3 (GOWN DISPOSABLE) ×2 IMPLANT
GOWN STRL REUS W/TWL LRG LVL3 (GOWN DISPOSABLE) ×2
GOWN STRL REUS W/TWL XL LVL3 (GOWN DISPOSABLE) ×4
LIQUID BAND (GAUZE/BANDAGES/DRESSINGS) ×3 IMPLANT
NS IRRIG 1000ML POUR BTL (IV SOLUTION) ×3 IMPLANT
PACK C SECTION AR (MISCELLANEOUS) ×3 IMPLANT
PAD OB MATERNITY 4.3X12.25 (PERSONAL CARE ITEMS) ×3 IMPLANT
PAD PREP 24X41 OB/GYN DISP (PERSONAL CARE ITEMS) ×3 IMPLANT
SUT MAXON ABS #0 GS21 30IN (SUTURE) ×6 IMPLANT
SUT VIC AB 1 CT1 36 (SUTURE) ×9 IMPLANT
SUT VIC AB 2-0 CT1 36 (SUTURE) ×3 IMPLANT
SUT VIC AB 4-0 FS2 27 (SUTURE) ×3 IMPLANT
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
## 2016-02-09 NOTE — Progress Notes (Signed)
Pt has been difficult to arouse and making inappropriate comments.  RN asked pt if she would like to see her baby.  Pt continued to make inappropriate comments and then fell asleep.  Administrative Coordinator asked pt what drugs she took before coming to hospital.  Pt stated that she "snorted coke" two days ago.  AC states to not allow any visitors except for pt's emergency contact.  Security notified.  Sylvan Cheese, Assistance Director, notified of plan. Reynold Bowen, RN 02/09/2016 9:14 PM

## 2016-02-09 NOTE — Progress Notes (Signed)
Dr. Tiburcio Pea updated on pt status. No new orders at this time. Continue routine care.

## 2016-02-09 NOTE — Progress Notes (Signed)
Pt had drop in BP and was saying weitd things (I hear sirens, I need cash, I cant breathe, and she was signing) BP 76/27 at one point Rapid response team called I was also called, started with fluid bolus Came to see pt- more responsive and appropriate    2L fluid bolus    EKG done, normal    CBC pending    Will hold narcotics for now, IV Tylenol and Toradol Cont to monitor

## 2016-02-09 NOTE — Significant Event (Signed)
Rapid Response Event Note  Overview: Time Called: 1756 Arrival Time: 1800 Event Type: Hypotension  Initial Focused Assessment: Patient with decreased responsiveness, hypotensive. S/P Caesarean section. Patient able to respond to verbal stimuli, but sleepy. Had previously received Versed, Fentanyl, Morphine and Dilaudid. Previous B/P 74/35. Current B/P 87/45.  Interventions: Paged  Dr. Tiburcio Pea, 1 Liter LR bolus infusing. HR 91, RR 20, 100% on RA. EKG, with NSR. Had planned to administer .04 mg Narcan, but patient became more responsive. CPG WNL.   Order for CBC, specifically WBC and Hgb.   Plan of Care (if not transferred): 2nd Liter of LR to be administered following initial bolus. Advised patient RN, Reeves Forth to recheck B/P after completion of 1st liter bolus, and then q 30 min x 4 after completion of 2nd LR bolus. L&D RN was going to assess fundus. Spoke with Dr. Tiburcio Pea in hallway after RR and updated on events noted.  Event Summary: Name of Physician Notified: Dr. Tiburcio Pea at [phone]    at    Outcome: Stayed in room and stabalized  Event End Time: 1825  Marilla Boddy
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
## 2016-02-09 NOTE — OB Triage Note (Signed)
Patient came in for observation for labor evaluation. Patient reports contractions every irregular uterine contractions that started around 0100. Patient rates pain 10 out of 10. Patient complains of leaking of fluid but denies vaginal bleeding and spotting. Vital signs stable and patient afebrile. Patient alone in room. Will continue to monitor.

## 2016-02-09 NOTE — Progress Notes (Signed)
RRT called  .pt BP dropped , pt. Lethargic, Narcan given EKG done, patient more arousable.

## 2016-02-09 NOTE — Op Note (Addendum)
Cesarean Section Procedure Note Indications: non-reassuring fetal status and term intrauterine pregnancy  Pre-operative Diagnosis: Intrauterine pregnancy [redacted]w[redacted]d ;  non-reassuring fetal status and term intrauterine pregnancy Post-operative Diagnosis: same, delivered. Procedure: Low Transverse Cesarean Section Surgeon: Annamarie Major, MD, FACOG Assistant(s): Dr Dalbert Garnet Anesthesia: Spinal anesthesia Estimated Blood Loss:300  Complications: None; patient tolerated the procedure well. Disposition: PACU - hemodynamically stable. Condition: stable  Findings: A female infant in the cephalic presentation. Amniotic fluid - Clear  Birth weight 3110 g.  Apgars of 9 and 9.  Intact placenta with a three-vessel cord. Grossly normal uterus, tubes and ovaries bilaterally. No intraabdominal adhesions were noted.  Procedure Details   The patient was taken to Operating Room, identified as the correct patient and the procedure verified as C-Section Delivery. A Time Out was held and the above information confirmed. After induction of anesthesia, the patient was draped and prepped in the usual sterile manner. A Pfannenstiel incision was made and carried down through the subcutaneous tissue to the fascia. Fascial incision was made and extended transversely with the Mayo scissors. The fascia was separated from the underlying rectus tissue superiorly and inferiorly. The peritoneum was identified and entered bluntly. Peritoneal incision was extended longitudinally. The utero-vesical peritoneal reflection was incised transversely and a bladder flap was created digitally.  A low transverse hysterotomy was made. The fetus was delivered atraumatically. The umbilical cord was clamped x2 and cut and the infant was handed to the awaiting pediatricians. The placenta was removed intact and appeared normal with a 3-vessel cord.  The uterus was exteriorized and cleared of all clot and debris. The hysterotomy was closed with running  sutures of 0 Vicryl suture. A second imbricating layer was placed with the same suture. Excellent hemostasis was observed. The uterus was returned to the abdomen. The pelvis was irrigated and again, excellent hemostasis was noted.  The rectus fascia was then reapproximated with running sutures of Maxon. Subcutaneous tissues are then irrigated with saline and hemostasis assured.  Skin is then closed with staples.. Instrument, sponge, and needle counts were correct prior to the abdominal closure and at the conclusion of the case.  The patient tolerated the procedure well and was transferred to the recovery room in stable condition.

## 2016-02-09 NOTE — Progress Notes (Addendum)
RRT Follow -up on pt. Patient lying in bed resting well. BP remains soft.No Resp. Distress noted. Pt care nurse stated that  Dr.Harris was notified  and no other interventions needed at this time. Nurse will continue to complete vitals as ordered.

## 2016-02-09 NOTE — Progress Notes (Signed)
Pt complained of chest pain and SOB.  Pt states that she can not hear anything except for loud sirens.  Pt making inappropriate comments such as "You're moving too fucking fast."  Then pt fell asleep and was not able to be aroused.  BP's in 70s/40s.  Dr. Tiburcio Pea notified.  Rapid Response called.  LR bolus started.  Labs, Blood Sugar, and EKG obtained.  After fluids, pt slightly more alert.  Will continue to monitor.  Dr. Tiburcio Pea called at 1805 to update on pt status.  No further orders received.  Dr. Tiburcio Pea in to assess pt at 1820.  Dr. Tiburcio Pea notified of lab results at 1905.  No further orders received. Royston Cowper [facility] 02/09/2016 8:23 PM

## 2016-02-09 NOTE — Progress Notes (Signed)
  Labor Progress Note   25 y.o. [id number] @ [redacted]w[redacted]d , admitted for  Pregnancy, Labor Management.   Subjective:  Painful ctxs.  Decels noted, slow progress, concern for fetal well being.  Objective:  BP 112/63   Pulse 98   Temp 97.8 F (36.6 C)   Resp 20   Ht 4\' 9"  (1.448 m)   Wt 150 lb (68 kg)   BMI 32.46 kg/m  Abd: moderate Extr: trace to 1+ bilateral pedal edema SVE: CERVIX: 4 cm dilated, 80 effaced, -3 station  EFM: FHR: 140 bpm, variability: moderate,  accelerations:  Present,  decelerations:  Absent Toco: Frequency: Every 5-6 minutes Labs: Current lab results:   Recent Labs  02/09/16 0743  WBC 10.1  HGB 9.1*  HCT 27.0*     Assessment & Plan:  [id number] @ [redacted]w[redacted]d, admitted for  Pregnancy and Labor/Delivery Management  1. Pain management: none. 2. FWB: FHT category 2.  3. ID: GBS not done, ABX one dose given. 4. Labor management: labor and concern for fetal well being.  CS. The risks of cesarean section discussed with the patient included but were not limited to: bleeding which may require transfusion or reoperation; infection which may require antibiotics; injury to bowel, bladder, ureters or other surrounding organs; injury to the fetus; need for additional procedures including hysterectomy in the event of a life-threatening hemorrhage; placental abnormalities wth subsequent pregnancies, incisional problems, thromboembolic phenomenon and other postoperative/anesthesia complications. The patient concurred with the proposed plan, giving informed written consent for the procedure.   All discussed with patient, see orders

## 2016-02-09 NOTE — Progress Notes (Signed)
PACU nurse reported that patient refused fundal checks during recovery. Upon doing vital signs and initial  assessment of patient, RN explained to patient the importance, reasoning, and frequency of fundal checks on the floor. Arelia Longest, RN was also present in the room and also educated the patient on fundal checks. Patient initially refused but after more discussion she agreed to let RN do the fundal check after she received pain medicine. Pain medicine was given to patient and patient still stalled about the fundal check stating she wants to wait until after the medicine kicked in. Patient finally let RN do fundal check but stated it hurt too much and she would not let us do anymore.
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
## 2016-02-09 NOTE — Transfer of Care (Signed)
Immediate Anesthesia Transfer of Care Note  Patient: Tara Raymond  Procedure(s) Performed: Procedure(s): CESAREAN SECTION (N/A)  Patient Location: PACU  Anesthesia Type:Regional and Spinal  Level of Consciousness: awake and patient cooperative  Airway & Oxygen Therapy: Patient Spontanous Breathing and Patient connected to nasal cannula oxygen  Post-op Assessment: Report given to RN and Post -op Vital signs reviewed and stable  Post vital signs: Reviewed and stable  Last Vitals:  Vitals:   02/09/16 1038 02/09/16 1405  BP: 112/63 117/72  Pulse: 98 79  Resp:  20  Temp: 36.6 C 36.8 C    Last Pain:  Vitals:   02/09/16 1208  TempSrc:   PainSc: Asleep         Complications: No apparent anesthesia complications

## 2016-02-09 NOTE — Progress Notes (Signed)
  Labor Progress Note   25 y.o. [id number] @ [redacted]w[redacted]d , admitted for  Pregnancy, Labor Management.   Subjective:  Painful ctxs.  Objective:  BP 133/80   Pulse (!) 113   Temp 99.1 F (37.3 C) (Oral)   Resp 20   Ht 4\' 9"  (1.448 m)   Wt 150 lb (68 kg)   BMI 32.46 kg/m  Abd: moderate Extr: trace to 1+ bilateral pedal edema SVE: CERVIX: 3 cm dilated, 80 effaced, -3 station  EFM: FHR: 140 bpm, variability: moderate,  accelerations:  Present,  decelerations:  Absent Toco: Frequency: Every 5-10 minutes Labs: Current lab results: No results for input(s): WBC, HGB, HCT in the last 72 hours.   Assessment & Plan:  [id number] @ [redacted]w[redacted]d, admitted for  Pregnancy and Labor/Delivery Management  1. Pain management: none. 2. FWB: FHT category 1.  3. ID: GBS not done 4. Labor management: early labor vs false labor.  Labs as has no PNC.  Monitor for cervical change.  ABX for GBS. All discussed with patient, see orders

## 2016-02-09 NOTE — Anesthesia Preprocedure Evaluation (Signed)
Anesthesia Evaluation  Patient identified by MRN, date of birth, ID band Patient awake    Reviewed: Allergy & Precautions, H&P , NPO status , Patient's Chart, lab work & pertinent test results, reviewed documented beta blocker date and time   Airway Mallampati: III  TM Distance: >3 FB Neck ROM: full    Dental no notable dental hx. (+) Teeth Intact, Poor Dentition   Pulmonary neg pulmonary ROS, Current Smoker,    Pulmonary exam normal breath sounds clear to auscultation       Cardiovascular Exercise Tolerance: Good negative cardio ROS Normal cardiovascular exam Rhythm:regular Rate:Normal     Neuro/Psych PSYCHIATRIC DISORDERS negative neurological ROS  negative psych ROS   GI/Hepatic negative GI ROS, Neg liver ROS,   Endo/Other  negative endocrine ROS  Renal/GU negative Renal ROS  negative genitourinary   Musculoskeletal   Abdominal   Peds  Hematology negative hematology ROS (+)   Anesthesia Other Findings   Reproductive/Obstetrics (+) Pregnancy                             Anesthesia Physical Anesthesia Plan  ASA: III and emergent  Anesthesia Plan: Regional and Spinal   Post-op Pain Management:    Induction:   Airway Management Planned:   Additional Equipment:   Intra-op Plan:   Post-operative Plan:   Informed Consent: I have reviewed the patients History and Physical, chart, labs and discussed the procedure including the risks, benefits and alternatives for the proposed anesthesia with the patient or authorized representative who has indicated his/her understanding and acceptance.     Plan Discussed with: CRNA  Anesthesia Plan Comments: (Pts high risk status with opioid and cocaine abuse reviewed and discussed with patient.  Will proceed with SAB>  JA)        Anesthesia Quick Evaluation

## 2016-02-10 ENCOUNTER — Encounter: Payer: Self-pay | Admitting: Obstetrics & Gynecology

## 2016-02-10 LAB — HEPATITIS B SURFACE ANTIGEN: Hepatitis B Surface Ag: NEGATIVE

## 2016-02-10 LAB — VARICELLA-ZOSTER BY PCR: VARICELLA-ZOSTER, PCR: NEGATIVE

## 2016-02-10 LAB — RUBELLA SCREEN: Rubella: 1.79 index (ref >0.99)

## 2016-02-10 LAB — RPR: RPR Ser Ql: NONREACTIVE

## 2016-02-10 NOTE — Clinical Social Work Note (Signed)
DSS CPS report made and they are very familiar with patient. The caseworker assigned is [place]: [phone]. She will be up to see patient shortly. CSW has updated patient's nurse today. York Spaniel MSW,LCSW [phone]

## 2016-02-10 NOTE — Anesthesia Post-op Follow-up Note (Signed)
  Anesthesia Pain Follow-up Note  Patient: Tara Raymond  Day #: 1  Date of Follow-up: 02/10/2016 Time: 7:14 AM  Last Vitals:  Vitals:   02/10/16 0200 02/10/16 0708  BP: (!) 88/43 (!) 97/57  Pulse: (!) 104 (!) 114  Resp: 18 18  Temp: 36.8 C 36.8 C    Level of Consciousness: alert  Pain: mild   Side Effects:None  Catheter Site Exam:clean, dry     Plan: D/C from anesthesia care  [facility]

## 2016-02-10 NOTE — Progress Notes (Signed)
   02/10/16 1643  Clinical Encounter Type  Visited With Patient  Visit Type Follow-up  Consult/Referral To Chaplain  Made a second attempt to visit with patient but she was sound asleep. Will try again later.   [employer] Terressa Evola [phone]

## 2016-02-10 NOTE — Anesthesia Postprocedure Evaluation (Signed)
Anesthesia Post Note  Patient: Tara Raymond  Procedure(s) Performed: Procedure(s) (LRB): CESAREAN SECTION (N/A)  Patient location during evaluation: Mother Baby Anesthesia Type: Spinal Level of consciousness: awake, awake and alert and oriented Pain management: pain level controlled Vital Signs Assessment: post-procedure vital signs reviewed and stable Respiratory status: spontaneous breathing, nonlabored ventilation and respiratory function stable Cardiovascular status: blood pressure returned to baseline and stable Postop Assessment: no headache and no backache Anesthetic complications: no    Last Vitals:  Vitals:   02/10/16 0200 02/10/16 0708  BP: (!) 88/43 (!) 97/57  Pulse: (!) 104 (!) 114  Resp: 18 18  Temp: 36.8 C 36.8 C    Last Pain:  Vitals:   02/10/16 0708  TempSrc: Oral  PainSc:                  [name]

## 2016-02-10 NOTE — Clinical Social Work Note (Addendum)
Tumi with DSS CPS arrived to hospital later in the morning and spoke with patient for approximately 45 minutes and then CSW received a call that patient was agitated and becoming loud and cursing. CSW advised to call security. CSW arrived to unit and requested that the DSS CPS caseworker step out of room and allow patient to de-escalate at that time. Tumi informed CSW that she would go back to the office and speak with her supervisor about filing for custody of patient's newborn and that patient's newborn would probably be placed with it's siblings in foster care. CSW advised to DSS CPS to let CSW know as soon as possible.   CSW later received a call from Tumi stating that she was told she will have to come back and do a safety plan with patient as this has to be completed prior to petitioning for custody. CSW advised that before she return, to inform CSW and that CSW will have staff get a Engineer, materials to accompany her into patient's room. Tumi verbalized understanding. CSW updated patient's nurse today as well. At this time, patient's infant is allowed to remain in patient's mother's room until further notice and update by DSS. York Spaniel MSW,LCSW [phone]

## 2016-02-10 NOTE — Progress Notes (Signed)
   02/10/16 1511  Clinical Encounter Type  Visited With Patient  Visit Type Initial  Referral From Nurse  Consult/Referral To Chaplain  I attempted to visit with patient but patient asked if I could come back later because she was trying to sleep. I will attempt a visit later.   [employer] Nicole Hafley [phone]

## 2016-02-10 NOTE — Progress Notes (Signed)
RN had spoken with Tara Raymond and Mechele Dawley, shift coordinator earlier r/t pt's non compliance, refusal to get up with assist to bathroom and ambulate in hall, distended abdomen, not passing gas, temp 99.5 orally, and using the "F" word when communicating with ns. Patient encouraged to walk and pt refused.

## 2016-02-10 NOTE — Progress Notes (Signed)
Post Op Day 1 Subjective:   Pt has not gotten out of bed yet. Foley is still in. She is requesting pain medicine because her chest is "caving in". She denies incision pain. She is tolerating PO intake. Pt is annoyed with provider and staff performing routine postpartum care and reluctantly agrees to allow me to look at incision site.   Objective:  Blood pressure (!) 97/57, pulse (!) 114, temperature 98.2 F (36.8 C), temperature source Oral, resp. rate 18, height 4\' 9"  (1.448 m), weight 68 kg (150 lb), SpO2 100 %,   General: NAD Pulmonary: no increased work of breathing Abdomen: non-distended, non-tender, fundus firm at level of umbilicus Incision: honeycomb dressing is c/d/i Extremities: no edema, no erythema, no tenderness  Results for orders placed or performed during the hospital encounter of 02/09/16 (from the past 24 hour(s))  CBC     Status: Abnormal   Collection Time: 02/09/16  6:05 PM  Result Value Ref Range   WBC 16.4 (H) 3.6 - 11.0 K/uL   RBC 2.73 (L) 3.80 - 5.20 MIL/uL   Hemoglobin 7.2 (L) 12.0 - 16.0 g/dL   HCT 28.3 (L) 15.1 - 76.1 %   MCV 82.6 80.0 - 100.0 fL   MCH 26.6 26.0 - 34.0 pg   MCHC 32.2 32.0 - 36.0 g/dL   RDW 60.7 (H) 37.1 - 06.2 %   Platelets 185 150 - 440 K/uL  Glucose, capillary     Status: Abnormal   Collection Time: 02/09/16  6:09 PM  Result Value Ref Range   Glucose-Capillary 136 (H) 65 - 99 mg/dL      Assessment:   25 y.o. G3P3003 postoperativeday # 1   Plan:  1) Acute blood loss anemia - hemodynamically stable and asymptomatic - po ferrous sulfate  2) A+/ Rubella status unknown / Varicella status unknown  3) TDAP status: no prenatal care   4) Formula/Contraception: not discussed at this time  5) Disposition: home- day of discharge depends on progress   Finbar Nippert, CNM

## 2016-02-10 NOTE — Progress Notes (Signed)
Pt uncooperative this shift. Refused fundal checks, peri care, turning and am blood draw. BP's remain in 80's to 90's over 40's and 50's. Pt only had a few sips of clear liquids throughout the night but insists on eating regular food this am, so diet advanced.

## 2016-02-10 NOTE — Progress Notes (Signed)
Patient has continued to refuse labs to be drawn

## 2016-02-10 NOTE — Clinical Social Work Note (Addendum)
Patient's newborn has tested positive for cocaine and marijuana in UDS. A DSS CPS [place] child report will be made by this CSW this morning. York Spaniel MSW,LCSW [phone]
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
## 2016-02-10 NOTE — Progress Notes (Signed)
   02/10/16 1900  Clinical Encounter Type  Visited With Patient  Visit Type Follow-up  Consult/Referral To Chaplain  Made third attempt to visit with patient but patient stated she was no up for any God talk. I assured her that our presence was not always one where God was mentioned but she stated she had done enough talking for the day. I informed her that we are available to her if she needed [place].   [employer] Hajar Penninger [phone]

## 2016-02-11 DIAGNOSIS — F4321 Adjustment disorder with depressed mood: Secondary | ICD-10-CM

## 2016-02-11 LAB — CULTURE, BETA STREP (GROUP B ONLY)

## 2016-02-11 LAB — HSV 1 AND 2 IGM ABS, INDIRECT

## 2016-02-11 LAB — SURGICAL PATHOLOGY

## 2016-02-11 MED ORDER — WHITE PETROLATUM GEL
Status: AC
  Filled 2016-02-11: qty 5

## 2016-02-11 MED ORDER — DOCUSATE SODIUM 100 MG PO CAPS
100.0000 mg | ORAL_CAPSULE | Freq: Every day | ORAL | Status: DC
  Administered 2016-02-11: 100 mg via ORAL
  Filled 2016-02-11 (×2): qty 1

## 2016-02-11 MED ORDER — FERROUS SULFATE 325 (65 FE) MG PO TABS
325.0000 mg | ORAL_TABLET | Freq: Two times a day (BID) | ORAL | Status: DC
  Administered 2016-02-11 – 2016-02-12 (×2): 325 mg via ORAL
  Filled 2016-02-11 (×2): qty 1

## 2016-02-11 NOTE — Progress Notes (Signed)
Patient informed by [facility] that DSS now has custody of infant and that he will remain in the nursery and she is not to see him.  Patient tearful and asked everyone to leave the room.

## 2016-02-11 NOTE — Progress Notes (Signed)
Spoke with Dr. Toni Amend from Psychiatry.  She was initially involutarily committed through [facility] due to heroin use during pregnancy.  It seems no basis for involuntary commitment was found and a week-long attempt was made to have her discharged to rehab care.  This was not successful. Several days ago she was discharged from the ER/Psychiatry service.  It is not clear when, but shortly after that she was readmitted and eventually taken to the OR for cesarean delivery.  Today she has had a range of emotion and it has been difficult (based on report) to provide optimal care for her.  Today was an especially difficult day for her due to DSS removing the baby from her custody. At this point, psychiatry reports no treatable issues and does not believe a consultation is appropriate.   We will continue to work with social work and care management to get her the best and safest possible discharge. I have assumed coverage for her care as of today. She was seen by Farrel Conners this morning.  See her notes for more details.  We will continue to monitor her for any signs of withdrawal or need for further psychiatric evaluation.   Thomasene Mohair, MD 02/11/2016 1:56 PM

## 2016-02-11 NOTE — Consult Note (Signed)
[facility] Face-to-Face Psychiatry Consult   Reason for Consult:  Consult to follow-up with this 25 year old woman with opiate abuse Referring Physician:  Jean Rosenthal Patient Identification: Tara Raymond MRN:  [id number] Principal Diagnosis: Adjustment disorder with depressed mood Diagnosis:   Patient Active Problem List   Diagnosis Date Noted  . Adjustment disorder with depressed mood [F43.21] 02/11/2016  . Substance abuse affecting pregnancy in third trimester, antepartum [W09.811, F19.10] 02/09/2016  . Labor and delivery, indication for care [O75.9] 02/03/2016  . Substance induced mood disorder (HCC) [F19.94] 01/28/2016  . 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 Cocaine abuse [F14.10] 01/28/2016  . Opiate abuse, continuous [F11.10] 01/28/2016  . Opiate withdrawal (HCC) [F11.23] 01/28/2016    Total Time spent with patient: 45 minutes  Subjective:   Tara Raymond is a 25 y.o. female patient admitted with "I'm not okay".  HPI:  Patient seen. Chart reviewed. Patient known from recent emergency room visit. Case discussed with obstetrics of attending. 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 Patient's child has already been taken into custody by the Department of Social Services. Patient is sad and down and withdrawn. She denies having any suicidal thoughts. States that she plans to go and stay with her mother. Patient was not very communicative but clearly he is very depressed and lethargic. She is trying to eat a little bit better. She does not appear to be having any psychotic symptoms or engaging in any dangerous behavior. OB/GYN staff do not report any concern about acute dangerousness.  Social history: Prior to admission patient had been homeless although she had been staying in between the time she left emergency room and  her admission to OB at a "friends" house. I did not press her for more details about that but she plans to go stay with her mother. Patient indicates that her mother is supportive but she doesn't feel like she has much in the way of other support in her life.  Medical history: This is her third child. Patient apparently has recovered fine from the operation. No other active ongoing medical problems. She had gone through opiate withdrawal during her time in the emergency room.  Substance abuse history: Has been abusing opiates as well as cocaine and intravenous drugs for a couple years now. No really long-standing sustain sobriety. Does have familiarity with treatment options.  Past Psychiatric History: Patient has no history of suicide attempts. No psychiatric hospitalization outside of attempts at treatment of substance abuse issues. I don't believe that she's been on antidepressant medicine in the past. Certainly had not been on any treatment recently. She does have familiarity with the local mental health agencies.  Risk to Self: Is patient at risk for suicide?: No Risk to Others:   Prior Inpatient Therapy:   Prior Outpatient Therapy:    Past Medical History:  Past Medical History:  Diagnosis Date  . Mental disorder     Past Surgical History:  Procedure Laterality Date  . CESAREAN SECTION N/A 02/09/2016   Procedure: CESAREAN SECTION;  Surgeon: Nadara Mustard, MD;  Location: [facility] ORS;  Service: Obstetrics;  Laterality: N/A;  . NO PAST SURGERIES     Family History: History reviewed. No pertinent family history. Family Psychiatric  History: Family history negative for any active mental health problems Social History:  History  Alcohol Use No     History  Drug Use  . Frequency: 2.0 times per week  . Types: Cocaine, Marijuana    Comment: Been in detox    Social History   Social History  . Marital status: Single    Spouse name: N/A  . Number of children: N/A  . Years of  education: N/A   Social History Main Topics  . Smoking status: Current Every Day Smoker    Packs/day: 0.25    Types: Cigarettes  . Smokeless tobacco: Current User  . Alcohol use No  . Drug use:     Frequency: 2.0 times per week    Types: Cocaine, Marijuana     Comment: Been in detox  . Sexual activity: Yes   Other Topics Concern  . None   Social History Narrative  . None   Additional Social History:    Allergies:  No Known Allergies  Labs:  Results for orders placed or performed during the hospital encounter of 02/09/16 (from the past 48 hour(s))  CBC     Status: Abnormal   Collection Time: 02/09/16  6:05 PM  Result Value Ref Range   WBC 16.4 (H) 3.6 - 11.0 K/uL   RBC 2.73 (L) 3.80 - 5.20 MIL/uL   Hemoglobin 7.2 (L) 12.0 - 16.0 g/dL   HCT 16.1 (L) 09.6 - 04.5 %   MCV 82.6 80.0 - 100.0 fL   MCH 26.6 26.0 - 34.0 pg   MCHC 32.2 32.0 - 36.0 g/dL   RDW 40.9 (H) 81.1 - 91.4 %   Platelets 185 150 - 440 K/uL  Glucose, capillary     Status: Abnormal   Collection Time: 02/09/16  6:09 PM  Result Value Ref Range   Glucose-Capillary 136 (H) 65 - 99 mg/dL    Current Facility-Administered Medications  Medication Dose Route Frequency Provider Last Rate Last Dose  . acetaminophen (TYLENOL) tablet 650 mg  650 mg Oral Q4H PRN Nadara Mustard, MD      . ALPRAZolam Prudy Feeler) tablet 0.25 mg  0.25 mg Oral BID PRN Nadara Mustard, MD      . coconut oil  1 application Topical PRN Nadara Mustard, MD      . witch hazel-glycerin (TUCKS) pad 1 application  1 application Topical PRN Nadara Mustard, MD       And  . dibucaine (NUPERCAINAL) 1 % rectal ointment 1 application  1 application Rectal PRN Nadara Mustard, MD      . diphenhydrAMINE (BENADRYL) capsule 25 mg  25 mg Oral Q6H PRN Nadara Mustard, MD      . docusate sodium (COLACE) capsule 100 mg  100 mg Oral Daily Farrel Conners, CNM   100 mg at 02/11/16 1208  . ferrous sulfate tablet 325 mg  325 mg Oral BID WC Farrel Conners, CNM       . ibuprofen (ADVIL,MOTRIN) tablet 600 mg  600 mg Oral Q6H Nadara Mustard, MD   600 mg at 02/11/16 1208  . lactated ringers bolus 1,000 mL  1,000 mL Intravenous Once Nadara Mustard, MD      . menthol-cetylpyridinium (CEPACOL) lozenge 3 mg  1 lozenge Oral Q2H PRN Nadara Mustard, MD      . naloxone [facility]) injection 0.4 mg  0.4 mg Intravenous Once Nadara Mustard, MD      . nicotine (NICODERM CQ - dosed in mg/24 hours) patch 14 mg  14 mg Transdermal Daily PRN Nadara Mustard, MD      . oxyCODONE-acetaminophen (  PERCOCET/ROXICET) 5-325 MG per tablet 1 tablet  1 tablet Oral Q4H PRN Nadara Mustard, MD      . oxyCODONE-acetaminophen (PERCOCET/ROXICET) 5-325 MG per tablet 2 tablet  2 tablet Oral Q4H PRN Nadara Mustard, MD   2 tablet at 02/11/16 1518  . prenatal multivitamin tablet 1 tablet  1 tablet Oral Q1200 Nadara Mustard, MD   1 tablet at 02/11/16 1208  . senna-docusate (Senokot-S) tablet 2 tablet  2 tablet Oral Q24H Nadara Mustard, MD      . simethicone [facility]) chewable tablet 80 mg  80 mg Oral TID PC Nadara Mustard, MD   80 mg at 02/10/16 2250  . simethicone (MYLICON) chewable tablet 80 mg  80 mg Oral Q24H Nadara Mustard, MD      . simethicone [facility]) chewable tablet 80 mg  80 mg Oral PRN Nadara Mustard, MD      . Tdap Leda Min) injection 0.5 mL  0.5 mL Intramuscular Once Nadara Mustard, MD      . white petrolatum (VASELINE) gel           . zolpidem (AMBIEN) tablet 5 mg  5 mg Oral QHS PRN Nadara Mustard, MD        Musculoskeletal: Strength & Muscle Tone: within normal limits Gait & Station: unsteady Patient leans: N/A  Psychiatric Specialty Exam: Physical Exam  Nursing note and vitals reviewed. Constitutional: She appears well-developed and well-nourished.  HENT:  Head: Normocephalic and atraumatic.  Eyes: Conjunctivae are normal. Pupils are equal, round, and reactive to light.  Neck: Normal range of motion.  Cardiovascular: Normal heart sounds.   Respiratory: Effort  normal.  GI: Soft.    Musculoskeletal: Normal range of motion.  Neurological: She is alert.  Skin: Skin is warm and dry.  Psychiatric: Judgment normal. Her affect is blunt. Her speech is delayed. She is slowed and withdrawn. Cognition and memory are normal. She exhibits a depressed mood. She expresses no suicidal ideation.    Review of Systems  Constitutional: Positive for malaise/fatigue.  HENT: Negative.   Eyes: Negative.   Respiratory: Negative.   Cardiovascular: Negative.   Gastrointestinal: Positive for abdominal pain.  Musculoskeletal: Negative.   Skin: Negative.   Neurological: Negative.   Psychiatric/Behavioral: Positive for depression. Negative for hallucinations and suicidal ideas. The patient is not nervous/anxious and does not have insomnia.     Blood pressure 112/62, pulse (!) 104, temperature 98 F (36.7 C), temperature source Oral, resp. rate 18, height 4\' 9"  (1.448 m), weight 68 kg (150 lb), SpO2 97 %, unknown if currently breastfeeding.Body mass index is 32.46 kg/m.  General Appearance: Casual  Eye Contact:  Minimal  Speech:  Slow  Volume:  Decreased  Mood:  Dysphoric  Affect:  Tearful  Thought Process:  Goal Directed  Orientation:  Full (Time, Place, and Person)  Thought Content:  Logical  Suicidal Thoughts:  No  Homicidal Thoughts:  No  Memory:  Immediate;   Good Recent;   Fair Remote;   Fair  Judgement:  Fair  Insight:  Fair  Psychomotor Activity:  Decreased  Concentration:  Concentration: Fair  Recall:  [employer] of Knowledge:  Fair  Language:  Fair  Akathisia:  No  Handed:  Right  AIMS (if indicated):     Assets:  Desire for Improvement Housing Physical Health  ADL's:  Intact  Cognition:  WNL  Sleep:        Treatment Plan Summary: Plan 25 year old woman who  is in the difficult situation of having just given birth and immediately having a child taken into custody for the third time. She is sad and down and withdrawn but has not been  behaving in a dangerous manner. No evidence of psychosis. Denies any suicidal thoughts. Patient is familiar with treatment options. We discussed the benefit that she could get from following up with local mental health agencies for substance abuse treatment. Ride to review with her what kind of support she has in her life currently which is kind of minimal. I we will see if we can suggest that the [facility] outreach coordinator be in touch with her if possible before discharge. Patient reminded to let staff know about any needs or concerns that she has. At this point I would not recommend starting antidepressant medicine. We reviewed dangerous signs of depression that she should look for in the future. I will follow-up tomorrow if she is still here.  Disposition: Patient does not meet criteria for psychiatric inpatient admission. Supportive therapy provided about ongoing stressors.  Mordecai Rasmussen, MD 02/11/2016 4:03 PM

## 2016-02-11 NOTE — Progress Notes (Signed)
Elease Hashimoto RN, [facility] in with RN X 20 minutes to talk with patient  and explain the importance of voiding and ambulating to move gas. Patient c/o gas; abdomen remains distended and somewhat soft/slightly firm. Patient assisted to roll on her side (moves very slowly with splinting incision with abdominal pillow); Patient with HOB slightly elevated, but refuses to sit up of get OOB to void or ambulate.

## 2016-02-11 NOTE — Clinical Social Work Note (Signed)
DSS CPS of [place] now has custody of patient's newborn. All legal paperwork has been placed on the front of patient and patient's newborn's charts. DSS CPS [facility] Caseworker: Tumi came up to see patient this morning to discuss any further possible options with patient prior to taking custody but patient could not come up with any and was not willing to go to a rehab facility without her newborn. The DSS caseworker left patient's room and stated patient was cursing at her. Shortly thereafter, patient asked to see her newborn. CSW had asked Tumi prior to her leaving if DSS was going to allow patient to see her newborn and Tumi stated she would have to go back to the office and speak with her supervisor and then would let me know. As a result of patient's behavior, CSW had an onsite police officer accompany me into patient's room to explain the above and that we would let her know if DSS was going to allow her to see her newborn. At that time, patient had begun crying and cursing at this CSW and told this CSW to leave her room. CSW complied. York Spaniel MSW,LCSW [phone]

## 2016-02-11 NOTE — Progress Notes (Signed)
Verbal order from Dr. Jean Rosenthal to order a behavioral medicine consult for patient before discharge.  Order placed for consult with Dr. Toni Amend and provider paged.

## 2016-02-11 NOTE — Progress Notes (Signed)
Patient informed that Tumi is here from DSS and will be coming in to see her in a few minutes.  Patient requesting percocet for pain medication.

## 2016-02-11 NOTE — Progress Notes (Signed)
Also patient informed of complications associated with not moving/ ambulating; patient v/u of same.

## 2016-02-11 NOTE — Progress Notes (Addendum)
DSS caseworker Tumi in to assess patient.  Patient requested pain medicine when Tumi arrived and was given 2 percocet as ordered.

## 2016-02-11 NOTE — Progress Notes (Signed)
Subjective:   "I am tired of everyone poking and prooding!" Passing flatus. Tolerating regular diet. Voiding without difficulty. Denies lightheadedness today. Bottle fed baby this Am. CSW and DSS will be up today to revaluate disposition of patient and baby. [facility] has been involved with her care in past- possibly patient can follow up with them as OP until inpatient bed available.   Objective:  Blood pressure 112/62, pulse (!) 104, temperature 98 F (36.7 C), temperature source Oral, resp. rate 18, height _0  (1.448 m), weight 68 kg (150 lb), SpO2 97 %, Urine output 89m/24 hours General: NAD, eating regular diet for breakfast Heart: mild tachycardia without murmur Pulmonary: no increased work of breathing, CTA, decreased breath sounds in bases Abdomen: slightly distended,BS active Incision: honeycomb dressing intact, clean and dry Extremities: no edema, no erythema, no tenderness  Results for orders placed or performed during the hospital encounter of 02/09/16 (from the past 72 hour(s))  Urine Drug Screen, Qualitative ([facility] only)     Status: Abnormal   Collection Time: 02/09/16  2:30 AM  Result Value Ref Range   Tricyclic, Ur Screen NONE DETECTED NONE DETECTED   Amphetamines, Ur Screen NONE DETECTED NONE DETECTED   MDMA (Ecstasy)Ur Screen NONE DETECTED NONE DETECTED   Cocaine Metabolite,Ur Ruth POSITIVE (A) NONE DETECTED   Opiate, Ur Screen NONE DETECTED NONE DETECTED   Phencyclidine (PCP) Ur S NONE DETECTED NONE DETECTED   Cannabinoid 50 Ng, Ur Griffith POSITIVE (A) NONE DETECTED   Barbiturates, Ur Screen NONE DETECTED NONE DETECTED   Benzodiazepine, Ur Scrn NONE DETECTED NONE DETECTED   Methadone Scn, Ur NONE DETECTED NONE DETECTED    Comment: (NOTE) 1038 Tricyclics, urine               Cutoff 1000 ng/mL 200  Amphetamines, urine             Cutoff 1000 ng/mL 300  MDMA (Ecstasy), urine           Cutoff 500 ng/mL 400  Cocaine Metabolite, urine       Cutoff 300 ng/mL 500  Opiate, urine                    Cutoff 300 ng/mL 600  Phencyclidine (PCP), urine      Cutoff 25 ng/mL 700  Cannabinoid, urine              Cutoff 50 ng/mL 800  Barbiturates, urine             Cutoff 200 ng/mL 900  Benzodiazepine, urine           Cutoff 200 ng/mL 1000 Methadone, urine                Cutoff 300 ng/mL 1100 1200 The urine drug screen provides only a preliminary, unconfirmed 1300 analytical test result and should not be used for non-medical 1400 purposes. Clinical consideration and professional judgment should 1500 be applied to any positive drug screen result due to possible 1600 interfering substances. A more specific alternate chemical method 1700 must be used in order to obtain a confirmed analytical result.  1800 Gas chromato graphy / mass spectrometry (GC/MS) is the preferred 1900 confirmatory method.   Hepatitis B surface antigen     Status: None   Collection Time: 02/09/16  7:43 AM  Result Value Ref Range   Hepatitis B Surface Ag Negative Negative    Comment: (NOTE) Performed At[id number]  Lindon Romp MD XH:[id number]   Rubella screen     Status: None   Collection Time: 02/09/16  7:43 AM  Result Value Ref Range   Rubella 1.79 Immune >0.99 index    Comment: (NOTE)                                Non-immune       <0.90                                Equivocal  0.90 - 0.99                                Immune           >0.99 Performed At: [facility] [place], [place] [id number] Lindon Romp MD [id number]   RPR     Status: None   Collection Time: 02/09/16  7:43 AM  Result Value Ref Range   RPR Ser Ql Non Reactive Non Reactive    Comment: (NOTE) Performed At: [facility] [place], [place] [id number] Lindon Romp MD [id number]   CBC     Status: Abnormal   Collection Time: 02/09/16  7:43 AM  Result Value Ref Range   WBC 10.1 3.6 - 11.0 K/uL   RBC 3.34  (L) 3.80 - 5.20 MIL/uL   Hemoglobin 9.1 (L) 12.0 - 16.0 g/dL   HCT 27.0 (L) 35.0 - 47.0 %   MCV 80.7 80.0 - 100.0 fL   MCH 27.2 26.0 - 34.0 pg   MCHC 33.7 32.0 - 36.0 g/dL   RDW 15.5 (H) 11.5 - 14.5 %   Platelets 189 150 - 440 K/uL  Differential     Status: None   Collection Time: 02/09/16  7:43 AM  Result Value Ref Range   Neutrophils Relative % 65 %   Neutro Abs 6.5 1.4 - 6.5 K/uL   Lymphocytes Relative 26 %   Lymphs Abs 2.6 1.0 - 3.6 K/uL   Monocytes Relative 9 %   Monocytes Absolute 0.9 0.2 - 0.9 K/uL   Eosinophils Relative 0 %   Eosinophils Absolute 0.0 0 - 0.7 K/uL   Basophils Relative 0 %   Basophils Absolute 0.0 0 - 0.1 K/uL  Rapid HIV screen (HIV 1/2 Ab+Ag)     Status: None   Collection Time: 02/09/16  7:43 AM  Result Value Ref Range   HIV-1 P24 Antigen - HIV24 NON REACTIVE NON REACTIVE   HIV 1/2 Antibodies NON REACTIVE NON REACTIVE   Interpretation (HIV Ag Ab)      A non reactive test result means that HIV 1 or HIV 2 antibodies and HIV 1 p24 antigen were not detected in the specimen.  Type and screen     Status: None   Collection Time: 02/09/16  7:43 AM  Result Value Ref Range   ABO/RH(D) A POS    Antibody Screen NEG    Sample Expiration 02/12/2016   Varicella-zoster by PCR     Status: None   Collection Time: 02/09/16  7:43 AM  Result Value Ref Range   Source Varicella-Zoster, PCR WHOLE BLOOD    Varicella-Zoster, PCR Negative Negative    Comment: (NOTE) No Varicella Zoster Virus DNA detected. This test was developed and its performance characteristics  determined by [facility].  It has not been cleared or approved by the Food and Drug Administration.  The FDA has determined that such clearance or approval is not necessary. Performed At: [facility] [place], [place] [id number] Lindon Romp MD VH:[id number]   Comprehensive metabolic panel     Status: Abnormal   Collection Time: 02/09/16  7:43 AM  Result Value Ref Range   Sodium 135  135 - 145 mmol/L   Potassium 3.2 (L) 3.5 - 5.1 mmol/L   Chloride 104 101 - 111 mmol/L   CO2 23 22 - 32 mmol/L   Glucose, Bld 109 (H) 65 - 99 mg/dL   BUN <5 (L) 6 - 20 mg/dL   Creatinine, Ser 0.56 0.44 - 1.00 mg/dL   Calcium 8.2 (L) 8.9 - 10.3 mg/dL   Total Protein 6.4 (L) 6.5 - 8.1 g/dL   Albumin 2.6 (L) 3.5 - 5.0 g/dL   AST 73 (H) 15 - 41 U/L   ALT 47 14 - 54 U/L   Alkaline Phosphatase 195 (H) 38 - 126 U/L   Total Bilirubin 0.4 0.3 - 1.2 mg/dL   GFR calc non Af Amer >60 >60 mL/min   GFR calc Af Amer >60 >60 mL/min    Comment: (NOTE) The eGFR has been calculated using the CKD EPI equation. This calculation has not been validated in all clinical situations. eGFR's persistently <60 mL/min signify possible Chronic Kidney Disease.    Anion gap 8 5 - 15  CBC     Status: Abnormal   Collection Time: 02/09/16  6:05 PM  Result Value Ref Range   WBC 16.4 (H) 3.6 - 11.0 K/uL   RBC 2.73 (L) 3.80 - 5.20 MIL/uL   Hemoglobin 7.2 (L) 12.0 - 16.0 g/dL   HCT 22.5 (L) 35.0 - 47.0 %   MCV 82.6 80.0 - 100.0 fL   MCH 26.6 26.0 - 34.0 pg   MCHC 32.2 32.0 - 36.0 g/dL   RDW 15.7 (H) 11.5 - 14.5 %   Platelets 185 150 - 440 K/uL  Glucose, capillary     Status: Abnormal   Collection Time: 02/09/16  6:09 PM  Result Value Ref Range   Glucose-Capillary 136 (H) 65 - 99 mg/dL     Assessment:   25 y.o. [id number] postoperativeday # 2-stable  Substance abuse of cocaine, MJ, and heroin this pregnancy     Plan:  1) Acute blood loss anemia - hemodynamically stable and asymptomatic-will discontinue IV and have patient on   - po ferrous sulfate/ vitamins  Ambulate with assistance  2) --/--/A POS (07/30 9324) / RI / Varicella NI/   4)Bottle  5) Disposition-pending CSW and DSS evaluation   Aivan Fillingim, CNM

## 2016-02-11 NOTE — Progress Notes (Signed)
While in to assess patient I informed her that I had received a call from [facility] and her case worker Tumi from DSS will be coming to visit her this morning.  Patient receptive.

## 2016-02-12 ENCOUNTER — Observation Stay
Admission: RE | Admit: 2016-02-12 | Discharge: 2016-02-14 | Disposition: A | Discharge status: Home / Self Care | Payer: Medicaid Other | Source: Ambulatory Visit | Attending: Certified Nurse Midwife | Admitting: Certified Nurse Midwife

## 2016-02-12 ENCOUNTER — Inpatient Hospital Stay
Admission: AD | Admit: 2016-02-12 | Discharge: 2016-02-12 | DRG: 881 | Disposition: A | Discharge status: Home / Self Care | Payer: Medicaid Other | Source: Intra-hospital | Attending: Psychiatry | Admitting: Psychiatry

## 2016-02-12 DIAGNOSIS — F1120 Opioid dependence, uncomplicated: Secondary | ICD-10-CM | POA: Diagnosis present

## 2016-02-12 DIAGNOSIS — D509 Iron deficiency anemia, unspecified: Secondary | ICD-10-CM | POA: Diagnosis not present

## 2016-02-12 DIAGNOSIS — F1420 Cocaine dependence, uncomplicated: Secondary | ICD-10-CM | POA: Diagnosis present

## 2016-02-12 DIAGNOSIS — Z79899 Other long term (current) drug therapy: Secondary | ICD-10-CM | POA: Diagnosis not present

## 2016-02-12 DIAGNOSIS — F1410 Cocaine abuse, uncomplicated: Secondary | ICD-10-CM | POA: Diagnosis present

## 2016-02-12 DIAGNOSIS — F1111 Opioid abuse, in remission: Secondary | ICD-10-CM | POA: Diagnosis present

## 2016-02-12 DIAGNOSIS — F4321 Adjustment disorder with depressed mood: Secondary | ICD-10-CM | POA: Insufficient documentation

## 2016-02-12 DIAGNOSIS — F322 Major depressive disorder, single episode, severe without psychotic features: Secondary | ICD-10-CM | POA: Diagnosis present

## 2016-02-12 DIAGNOSIS — F17210 Nicotine dependence, cigarettes, uncomplicated: Secondary | ICD-10-CM | POA: Diagnosis present

## 2016-02-12 DIAGNOSIS — Z9889 Other specified postprocedural states: Secondary | ICD-10-CM | POA: Insufficient documentation

## 2016-02-12 DIAGNOSIS — D62 Acute posthemorrhagic anemia: Secondary | ICD-10-CM | POA: Diagnosis present

## 2016-02-12 DIAGNOSIS — O99345 Other mental disorders complicating the puerperium: Secondary | ICD-10-CM | POA: Diagnosis not present

## 2016-02-12 DIAGNOSIS — O99335 Smoking (tobacco) complicating the puerperium: Secondary | ICD-10-CM | POA: Diagnosis not present

## 2016-02-12 DIAGNOSIS — F329 Major depressive disorder, single episode, unspecified: Secondary | ICD-10-CM | POA: Diagnosis present

## 2016-02-12 LAB — CBC
HCT: 12.7 % — CL (ref 35.0–47.0)
HEMATOCRIT: 12.8 % — AB (ref 35.0–47.0)
HEMOGLOBIN: 4.3 g/dL — AB (ref 12.0–16.0)
Hemoglobin: 4.4 g/dL — CL (ref 12.0–16.0)
MCH: 28.6 pg (ref 26.0–34.0)
MCH: 28.3 pg (ref 26.0–34.0)
MCHC: 34.4 g/dL (ref 32.0–36.0)
MCHC: 34 g/dL (ref 32.0–36.0)
MCV: 83.1 fL (ref 80.0–100.0)
MCV: 83.3 fL (ref 80.0–100.0)
PLATELETS: 248 10*3/uL (ref 150–440)
Platelets: 264 10*3/uL (ref 150–440)
RBC: 1.53 MIL/uL — AB (ref 3.80–5.20)
RBC: 1.54 MIL/uL — ABNORMAL LOW (ref 3.80–5.20)
RDW: 16.3 % — ABNORMAL HIGH (ref 11.5–14.5)
RDW: 16.4 % — ABNORMAL HIGH (ref 11.5–14.5)
WBC: 9.4 10*3/uL (ref 3.6–11.0)
WBC: 9.4 10*3/uL (ref 3.6–11.0)

## 2016-02-12 MED ORDER — ACETAMINOPHEN 325 MG PO TABS: 650.0000 mg | ORAL_TABLET | Freq: Four times a day (QID) | ORAL | Status: DC | PRN

## 2016-02-12 MED ORDER — DIPHENHYDRAMINE HCL 25 MG PO CAPS
25.0000 mg | ORAL_CAPSULE | Freq: Four times a day (QID) | ORAL | Status: DC | PRN
Start: 2016-02-12 — End: 2016-02-12

## 2016-02-12 MED ORDER — FERROUS SULFATE 325 (65 FE) MG PO TABS
325.0000 mg | ORAL_TABLET | Freq: Two times a day (BID) | ORAL | Status: DC
  Administered 2016-02-12: 325 mg via ORAL
  Filled 2016-02-12: qty 1

## 2016-02-12 MED ORDER — METHOCARBAMOL 500 MG PO TABS: 750.0000 mg | ORAL_TABLET | Freq: Four times a day (QID) | ORAL | Status: DC | PRN

## 2016-02-12 MED ORDER — MENTHOL 3 MG MT LOZG
1.0000 | LOZENGE | OROMUCOSAL | Status: DC | PRN
  Filled 2016-02-12: qty 9

## 2016-02-12 MED ORDER — TRAZODONE HCL 100 MG PO TABS
100.0000 mg | ORAL_TABLET | Freq: Every day | ORAL | Status: DC
  Administered 2016-02-12: 100 mg via ORAL
  Filled 2016-02-12: qty 1

## 2016-02-12 MED ORDER — SENNOSIDES-DOCUSATE SODIUM 8.6-50 MG PO TABS: 2.0000 | ORAL_TABLET | ORAL | Status: DC

## 2016-02-12 MED ORDER — COCONUT OIL OIL
1.0000 "application " | TOPICAL_OIL | Status: DC | PRN
  Filled 2016-02-12: qty 120

## 2016-02-12 MED ORDER — OXYCODONE-ACETAMINOPHEN 5-325 MG PO TABS
1.0000 | ORAL_TABLET | ORAL | Status: DC | PRN
  Administered 2016-02-12: 1 via ORAL
  Filled 2016-02-12 (×2): qty 1

## 2016-02-12 MED ORDER — WITCH HAZEL-GLYCERIN EX PADS
1.0000 "application " | MEDICATED_PAD | CUTANEOUS | Status: DC | PRN
  Filled 2016-02-12: qty 100

## 2016-02-12 MED ORDER — PRENATAL MULTIVITAMIN CH: 1.0000 | ORAL_TABLET | Freq: Every day | ORAL | Status: DC

## 2016-02-12 MED ORDER — IBUPROFEN 600 MG PO TABS
600.0000 mg | ORAL_TABLET | Freq: Four times a day (QID) | ORAL | Status: DC
  Administered 2016-02-12: 600 mg via ORAL
  Filled 2016-02-12: qty 1

## 2016-02-12 MED ORDER — SIMETHICONE 80 MG PO CHEW
80.0000 mg | CHEWABLE_TABLET | Freq: Three times a day (TID) | ORAL | Status: DC
  Administered 2016-02-12: 80 mg via ORAL
  Filled 2016-02-12: qty 1

## 2016-02-12 MED ORDER — DOCUSATE SODIUM 100 MG PO CAPS: 100.0000 mg | ORAL_CAPSULE | Freq: Every day | ORAL | Status: DC

## 2016-02-12 MED ORDER — ONDANSETRON HCL 4 MG PO TABS: 4.0000 mg | ORAL_TABLET | Freq: Three times a day (TID) | ORAL | Status: DC | PRN

## 2016-02-12 MED ORDER — ACETAMINOPHEN 325 MG PO TABS: 650.0000 mg | ORAL_TABLET | ORAL | Status: DC | PRN

## 2016-02-12 MED ORDER — NICOTINE 14 MG/24HR TD PT24: 14.0000 mg | MEDICATED_PATCH | Freq: Every day | TRANSDERMAL | Status: DC | PRN

## 2016-02-12 MED ORDER — LORAZEPAM 1 MG PO TABS
1.0000 mg | ORAL_TABLET | ORAL | Status: AC
  Administered 2016-02-12: 1 mg via ORAL
  Filled 2016-02-12: qty 1

## 2016-02-12 MED ORDER — MEDROXYPROGESTERONE ACETATE 150 MG/ML IM SUSP: 150.0000 mg | Freq: Once | INTRAMUSCULAR | Status: DC

## 2016-02-12 MED ORDER — TRAZODONE HCL 100 MG PO TABS
100.0000 mg | ORAL_TABLET | Freq: Every day | ORAL | Status: DC
  Filled 2016-02-12: qty 1

## 2016-02-12 MED ORDER — ALUM & MAG HYDROXIDE-SIMETH 200-200-20 MG/5ML PO SUSP: 30.0000 mL | ORAL | Status: DC | PRN

## 2016-02-12 MED ORDER — DIBUCAINE 1 % RE OINT
1.0000 "application " | TOPICAL_OINTMENT | RECTAL | Status: DC | PRN
  Filled 2016-02-12: qty 56.7

## 2016-02-12 MED ORDER — ZOLPIDEM TARTRATE 5 MG PO TABS: 5.0000 mg | ORAL_TABLET | Freq: Every evening | ORAL | Status: DC | PRN

## 2016-02-12 MED ORDER — MAGNESIUM HYDROXIDE 400 MG/5ML PO SUSP: 30.0000 mL | Freq: Every day | ORAL | Status: DC | PRN

## 2016-02-12 NOTE — BH Assessment (Signed)
Patient is to be admitted to [facility] by Dr. Toni Amend.  Attending Physician will be Dr. Jennet Maduro.   Patient has been assigned to room 309, by [facility] Charge Nurse Gwen.   Intake Paper Work has been signed and placed on patient chart.  Staff is aware of the admission Enrique Sack, Patient's Nurse & Marny Lowenstein, Patient Access).

## 2016-02-12 NOTE — Discharge Summary (Signed)
Physician Discharge Summary Note  Patient:  Tara Raymond is an 25 y.o., female MRN:  [id number] DOB:  08-May-1991 Patient phone:  [phone] (home)  Patient address:   [address] [place] [address],   Date of Admission:  02/12/2016 Date of Discharge: 02/12/2016  Reason for Admission:  Severe depression.  I DID NOT PERSONALLY MET OR EXAMINED THE PATIENT. I AM USING CONSULTATION NOTE PROVIDED BY DR. Weber Cooks EARLIER TODAY TO COMPLETE ADMISSION AND DISCHARGE DOCUMENTATION.  Tara Raymond was transfer to psychiatry from labor/deliveryearlier this afternoon to treat depression. Soon following transfer we realized that her Hg was dangerously low. I contacted Dr. Georgianne Fick, OB/GYN on call, who decided that the patient needs blood transfusion. She will be transferred back to labor/deivery tonight for further treatment. Dr. Danielle Rankin help is greatly appreciated.   Reason for Consult: Follow-up care for this 25 year old woman with a history of substance abuse multiple major stresses acute worsening of mood symptoms Referring Physician: Kenton Kingfisher Patient Identification: Tara Raymond INO:[id number] Principal Diagnosis:Adjustment disorder with depressed mood Diagnosis:      Patient Active Problem List   Diagnosis Date Noted  . Adjustment disorder with depressed mood [F43.21] 02/11/2016  . Substance abuse affecting pregnancy in third trimester, antepartum [S96.283, F19.10] 02/09/2016  . Labor and delivery, indication for care [O75.9] 02/03/2016  . Substance induced mood disorder ([place]) [F19.94] 01/28/2016  . Severe major depression, single episode, without psychotic features ([place]) [F32.2] 01/28/2016  . Cocaine abuse [F14.10] 01/28/2016  . Opiate abuse, continuous [F11.10] 01/28/2016  . Opiate withdrawal ([place]) [F11.23] 01/28/2016    Total Time spent with patient: 30 minutes  Subjective:  Tara N Briggsis a 25 y.o.femalepatient admitted with "I just can't stop  crying".  HPI: Patient interviewed. Chart reviewed. Case reviewed with nursing staff on the mother baby ward. Patient has been having more and more mood symptoms over the last day. She feels very depressed down and hopeless. She has been unable to stop crying. She can't sleep at night. Has had nightmares and is constantly ruminating about her children. Patient denies any acute suicidal intent but is unable to think very clearly about a plan for the future. She has not been able to eat well. She is having a lot of pain probably even more than would be expected from the cesarean section. Patient is feeling panicky it seems especially in light of discharge home. Does not feel like she really has any support.  Medical history: 4 days after a uncomplicated cesarean delivery. History of opiate dependence and probably still having some withdrawal symptoms.  Social history: Apparently she can go live with her mother after discharge but that sounds like it's not the most welcoming her supportive environment. Patient had previously been living on the street and living by prostitution and heroin abuse. Not clear whether she really has a safe stable place to stay. Social services is involved with the newborn already which is upsetting to the patient.  Substance abuse history: History of active abuse of heroin and cocaine and other drugs. She had relapsed after her last stay in the emergency room and is probably back to having at least some degree of opiate withdrawal symptoms.  Past Psychiatric History: No history of psychiatric hospitalization. No history of suicide attempts. Never really has had any mental health treatment other than the stay that we had in the hospital recently.  Risk to Self: Is patient at risk for suicide?: No Risk to Others:  Prior Inpatient Therapy:  Prior Outpatient  Therapy:   Past Medical History:     Past Medical History:  Diagnosis Date  . Mental disorder          Past Surgical History:  Procedure Laterality Date  . CESAREAN SECTION N/A 02/09/2016   Procedure: CESAREAN SECTION; Surgeon: Gae Dry, MD; Location: [facility] ORS; Service: Obstetrics; Laterality: N/A;  . NO PAST SURGERIES     Family History: History reviewed. No pertinent family history. Family Psychiatric History: Patient reports a family history of substance abuse. Social History:    History  Alcohol Use No        History  Drug Use  . Frequency: 2.0 times per week  . Types: Cocaine, Marijuana    Comment: Been in detox   Social History        Social History  . Marital status: Single    Spouse name: N/A  . Number of children: N/A  . Years of education: N/A         Social History Main Topics  . Smoking status: Current Every Day Smoker    Packs/day: 0.25    Types: Cigarettes  . Smokeless tobacco: Current User  . Alcohol use No  . Drug use:     Frequency: 2.0 times per week    Types: Cocaine, Marijuana     Comment: Been in detox  . Sexual activity: Yes       Other Topics Concern  . None      Social History Narrative  . None   Additional Social History:   Allergies: No Known Allergies  Labs: Lab Results Last 48 Hours  No results found for this or any previous visit (from the past 23 hour(s)).             Current Facility-Administered Medications  Medication Dose Route Frequency Provider Last Rate Last Dose  . acetaminophen (TYLENOL) tablet 650 mg 650 mg Oral Q4H PRN Gae Dry, MD    . ALPRAZolam Duanne Moron) tablet 0.25 mg 0.25 mg Oral BID PRN Gae Dry, MD  0.25 mg at 02/12/16 0944  . coconut oil 1 application Topical PRN Gae Dry, MD    . witch hazel-glycerin (TUCKS) pad 1 application 1 application Topical PRN Gae Dry, MD     And  . dibucaine (NUPERCAINAL) 1 % rectal ointment 1 application 1 application Rectal PRN Gae Dry, MD     . diphenhydrAMINE (BENADRYL) capsule 25 mg 25 mg Oral Q6H PRN Gae Dry, MD    . docusate sodium (COLACE) capsule 100 mg 100 mg Oral Daily Dalia Heading, CNM  100 mg at 02/11/16 1208  . ferrous sulfate tablet 325 mg 325 mg Oral BID WC Dalia Heading, CNM  325 mg at 02/12/16 0802  . ibuprofen (ADVIL,MOTRIN) tablet 600 mg 600 mg Oral Q6H Gae Dry, MD  600 mg at 02/12/16 1141  . LORazepam (ATIVAN) tablet 1 mg 1 mg Oral STAT John T Clapacs, MD    . menthol-cetylpyridinium (CEPACOL) lozenge 3 mg 1 lozenge Oral Q2H PRN Gae Dry, MD    . nicotine (NICODERM CQ - dosed in mg/24 hours) patch 14 mg 14 mg Transdermal Daily PRN Gae Dry, MD    . oxyCODONE-acetaminophen (PERCOCET/ROXICET) 5-325 MG per tablet 1 tablet 1 tablet Oral Q4H PRN Gae Dry, MD    . oxyCODONE-acetaminophen (PERCOCET/ROXICET) 5-325 MG per tablet 2 tablet 2 tablet Oral Q4H PRN Gae Dry, MD  2 tablet at 02/12/16 1209  .  prenatal multivitamin tablet 1 tablet 1 tablet Oral Q1200 Gae Dry, MD  1 tablet at 02/11/16 1208  . senna-docusate (Senokot-S) tablet 2 tablet 2 tablet Oral Q24H Gae Dry, MD  2 tablet at 02/11/16 2330  . simethicone (MYLICON) chewable tablet 80 mg 80 mg Oral TID PC Gae Dry, MD  80 mg at 02/12/16 0802  . simethicone (MYLICON) chewable tablet 80 mg 80 mg Oral Q24H Gae Dry, MD    . simethicone [facility]) chewable tablet 80 mg 80 mg Oral PRN Gae Dry, MD    . Tdap Durwin Reges) injection 0.5 mL 0.5 mL Intramuscular Once Gae Dry, MD    . traZODone (DESYREL) tablet 100 mg 100 mg Oral QHS Gonzella Lex, MD    . zolpidem (AMBIEN) tablet 5 mg 5 mg Oral QHS PRN Gae Dry, MD      Musculoskeletal: Strength & Muscle Tone:within normal limits Gait & Station:normal Patient leans:N/A  Psychiatric Specialty Exam: Physical Exam Nursing noteand vitalsreviewed. Constitutional: She  appears well-developedand well-nourished.  HENT:  Head: Normocephalicand atraumatic.  Eyes: Conjunctivaeare normal. Pupils are equal, round, and reactive to light.  Neck: Normal range of motion.  Cardiovascular: Normal heart sounds.  Respiratory: Effort normal.  GI: Soft.    Musculoskeletal: Normal range of motion.  Neurological: She is alert.  Skin: Skin is warmand dry.  Psychiatric: Her mood appears anxious. Her affect is labile. Her speech is slurred. She is slowed. Cognition and memory are impaired. She expresses impulsivity. She exhibits a depressed mood. She expresses no suicidalideation.    Review of Systems  HENT: Negative.  Eyes: Negative.  Respiratory: Negative.  Cardiovascular: Negative.  Gastrointestinal: Positive for abdominal painand nausea.  Musculoskeletal: Negative.  Skin: Negative.  Neurological: Positive for weakness.  Psychiatric/Behavioral: Positive for depressionand substance abuse. Negative for hallucinations, memory lossand suicidal ideas. The patient is nervous/anxiousand has insomnia.    Blood pressure 110/69, pulse 80, temperature 98.5 F (36.9 C), temperature source Oral, resp. rate 18, height _0  (1.448 m), weight 68 kg (150 lb), SpO2 98 %, unknown if currently breastfeeding.Body mass index is 32.46 kg/m.  General Appearance:Casual  Eye Contact: Minimal  Speech: Slow and Slurred  Volume: Decreased  Mood: Depressed, Dysphoric and Hopeless  Affect: Tearful  Thought Process: Descriptions of Associations: Tangential  Orientation: Full (Time, Place, and Person)  Thought Content: Rumination  Suicidal Thoughts: No  Homicidal Thoughts: No  Memory: Immediate; Fair Recent; Fair Remote; Fair  Judgement: Fair  Insight: Fair  Psychomotor Activity: Decreased  Concentration: Concentration: Fair  Recall: [employer] of Knowledge: Fair  Language: Fair  Akathisia: No  Handed: Right  AIMS (if indicated):    Assets: Desire for Improvement Physical Health  ADL's: Intact  Cognition: WNL  Sleep:      Treatment Plan Summary: Daily contact with patient to assess and evaluate symptoms and progress in treatment, Medication management and Plan 25 year old woman with current symptoms of extremely depressed mood, anxiety, sleeplessness, hopelessness, feeling very overwhelmed. Not eating well. Current symptoms are a combination of adjustment to severe stress, possible worsening of what might become a major depression, possible opiate withdrawal. Given the severity of her symptoms as well as the high risk of relapse into dangerous behavior as well as the lack of outpatient support it appears to be to me reasonable to admit her to the psychiatric hospital. She does not meet commitment criteria. I explain the situation to the patient and offered her the opportunity for  voluntary admission to psychiatry. She is tentatively agreeable. Reviewed situation with nursing and obstetrics staff. Patient is dependent for discharge already today. I've spoken with TTS. We will plan on trying to get her discharged and readmitted down to the psychiatry ward if she will agree to it. I have given her a 1 time dose of Ativan right now. I've given her an order for trazodone at night. If we admitted her to psychiatry O probably give her some more when necessary medicines to cover both opiate withdrawal and anxiety. Supportive counseling with the patient.  Disposition: Recommend psychiatric Inpatient admission when medically cleared. Supportive therapy provided about ongoing stressors.  Alethia Berthold, MD 8/2/201712:57 PM   Principal Problem: Major depression, single episode Discharge Diagnoses: Patient Active Problem List   Diagnosis Date Noted  . Major depression, single episode [F32.9] 02/12/2016    Priority: High  . Postpartum care following cesarean delivery [Z39.2] 02/12/2016  . Postoperative anemia due to acute  blood loss [D62] 02/12/2016  . Adjustment disorder with depressed mood [F43.21] 02/11/2016  . Substance abuse affecting pregnancy in third trimester, antepartum [Z36.644, F19.10] 02/09/2016  . Labor and delivery, indication for care [O75.9] 02/03/2016  . Substance induced mood disorder ([place]) [F19.94] 01/28/2016  . Severe major depression, single episode, without psychotic features ([place]) [F32.2] 01/28/2016  . Cocaine use disorder, moderate, dependence ([place]) [F14.20] 01/28/2016  . Opioid use disorder, moderate, dependence ([place]) [F11.20] 01/28/2016  . Opiate withdrawal [facility]) [F11.23] 01/28/2016    Past Psychiatric history: substance abuse.  Past Medical History:  Past Medical History:  Diagnosis Date  . Mental disorder     Past Surgical History:  Procedure Laterality Date  . CESAREAN SECTION N/A 02/09/2016   Procedure: CESAREAN SECTION;  Surgeon: Gae Dry, MD;  Location: [facility] ORS;  Service: Obstetrics;  Laterality: N/A;  . NO PAST SURGERIES     Family History: History reviewed. No pertinent family history. Family Psychiatric  History: multiple family membrs with substance abuse. Social History:  History  Alcohol Use No     History  Drug Use  . Frequency: 2.0 times per week  . Types: Cocaine, Marijuana    Comment: Been in detox    Social History   Social History  . Marital status: Single    Spouse name: N/A  . Number of children: N/A  . Years of education: N/A   Social History Main Topics  . Smoking status: Current Every Day Smoker    Packs/day: 0.25    Types: Cigarettes  . Smokeless tobacco: Current User  . Alcohol use No  . Drug use:     Frequency: 2.0 times per week    Types: Cocaine, Marijuana     Comment: Been in detox  . Sexual activity: Yes   Other Topics Concern  . None   Social History Narrative  . None    Hospital Course:    Ms. Hacking is a 25 year old female with a history of substance abuse transferred to psychiatry on the third dy after  cesarean section for treatment of depression.   1. [place]. Hg was low today afternoon at 4.4 and 4.3 on repeat with stable vital signs. The patient was seen by Dr. Georgianne Fick and transferred back to labor delivery for transfusion.   Physical Findings: AIMS:  , ,  ,  , Dental Status Current problems with teeth and/or dentures?: No Does patient usually wear dentures?: No  CIWA:    COWS:  COWS Total Score: 3  Psychiatric Specialty Exam:  Physical Exam  Nursing note and vitals reviewed.   Review of Systems  Unable to perform ROS: Other    Blood pressure 115/61, pulse (!) 115, temperature 98.3 F (36.8 C), resp. rate 16, height _0  (1.473 m), weight 64.9 kg (143 lb), SpO2 99 %, unknown if currently breastfeeding.Body mass index is 29.89 kg/m.                                                         Have you used any form of tobacco in the last 30 days? (Cigarettes, Smokeless Tobacco, Cigars, and/or Pipes): Yes  Has this patient used any form of tobacco in the last 30 days? (Cigarettes, Smokeless Tobacco, Cigars, and/or Pipes) Yes, Yes, A prescription for an FDA-approved tobacco cessation medication was offered at discharge and the patient refused  Blood Alcohol level:  Lab Results  Component Value Date   ETH <5 41/63/8453    Metabolic Disorder Labs:  No results found for: HGBA1C, MPG No results found for: PROLACTIN No results found for: CHOL, TRIG, HDL, CHOLHDL, VLDL, LDLCALC  See Psychiatric Specialty Exam and Suicide Risk Assessment completed by Attending Physician prior to discharge.  Discharge destination:  Other:  labor and delivery unit  Is patient on multiple antipsychotic therapies at discharge:  No   Has Patient had three or more failed trials of antipsychotic monotherapy by history:  No  Recommended Plan for Multiple Antipsychotic Therapies: NA  Discharge Instructions    Diet - low sodium heart healthy    Complete by:  As directed    Increase activity slowly    Complete by:  As directed       Medication List    You have not been prescribed any medications.      Follow-up recommendations:  Activity:  as tolerated. Diet:  regular. Other:  keep follow up appointments.  Comments:    Signed: Orson Slick, MD 02/12/2016, 9:45 PM

## 2016-02-12 NOTE — Progress Notes (Addendum)
Patient ID: Tara Raymond, female   DOB: 12-25-1990, 25 y.o.   MRN: [id number] Ms. Lawhorne was transferred from OB/GYN floor this afternoon 3 days following cesarean section. Hg was 7.2 yesterday. Today afternoon 4.4. VS are stable.   Spoke with Dr. Bonney Aid on call [phone]. We will repeat CBC STAT and monitor VS every hour until results back. If Hg still low or if patient develops symptoms (tachycardia, low BP) will transfer back to 3rd floor for transfusion.

## 2016-02-12 NOTE — Progress Notes (Signed)
Report called to Behavioral Med

## 2016-02-12 NOTE — BH Assessment (Signed)
Per the request of the patient, writer called Christean Leaf, [facility] and informed her she was at the hospital and gave birth. Also informed her patient will be admitted to [facility]. Patient requested Bria phone number. Writer informed Bria about the patient and obtained the number [phone]) she wanted her to have and forwarded to the patient. She did not want her personal cell number giving out.

## 2016-02-12 NOTE — Consult Note (Signed)
[facility] Face-to-Face Psychiatry Consult   Reason for Consult:  Follow-up care for this 25 year old woman with a history of substance abuse multiple major stresses acute worsening of mood symptoms Referring Physician:  Tiburcio Pea Patient Identification: Tara Raymond MRN:  [id number] Principal Diagnosis: Adjustment disorder with depressed mood Diagnosis:   Patient Active Problem List   Diagnosis Date Noted  . Adjustment disorder with depressed mood [F43.21] 02/11/2016  . Substance abuse affecting pregnancy in third trimester, antepartum [W09.811, F19.10] 02/09/2016  . Labor and delivery, indication for care [O75.9] 02/03/2016  . Substance induced mood disorder (HCC) [F19.94] 01/28/2016  . 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 Cocaine abuse [F14.10] 01/28/2016  . Opiate abuse, continuous [F11.10] 01/28/2016  . Opiate withdrawal (HCC) [F11.23] 01/28/2016    Total Time spent with patient: 30 minutes  Subjective:   Tara Raymond is a 25 y.o. female patient admitted with "I just can't stop crying".  HPI:  Patient interviewed. Chart reviewed. Case reviewed with nursing staff on the mother baby ward. Patient has been having more and more mood symptoms over the last day. She feels very depressed down and hopeless. She has been unable to stop crying. She can't sleep at night. Has had nightmares and is constantly ruminating about her children. Patient denies any acute suicidal intent but is unable to think very clearly about a plan for the future. She has not been able to eat well. She is having a lot of pain probably even more than would be expected from the cesarean section. Patient is feeling panicky it seems especially in light of discharge home. Does not feel like she really has any support.  Medical history: 4 days after a uncomplicated cesarean delivery. History of opiate dependence and probably still having some withdrawal  symptoms.  Social history: Apparently she can go live with her mother after discharge but that sounds like it's not the most welcoming her supportive environment. Patient had previously been living on the street and living by prostitution and heroin abuse. Not clear whether she really has a safe stable place to stay. Social services is involved with the newborn already which is upsetting to the patient.  Substance abuse history: History of active abuse of heroin and cocaine and other drugs. She had relapsed after her last stay in the emergency room and is probably back to having at least some degree of opiate withdrawal symptoms.  Past Psychiatric History: No history of psychiatric hospitalization. No history of suicide attempts. Never really has had any mental health treatment other than the stay that we had in the hospital recently.  Risk to Self: Is patient at risk for suicide?: No Risk to Others:   Prior Inpatient Therapy:   Prior Outpatient Therapy:    Past Medical History:  Past Medical History:  Diagnosis Date  . Mental disorder     Past Surgical History:  Procedure Laterality Date  . CESAREAN SECTION N/A 02/09/2016   Procedure: CESAREAN SECTION;  Surgeon: Nadara Mustard, MD;  Location: [facility] ORS;  Service: Obstetrics;  Laterality: N/A;  . NO PAST SURGERIES     Family History: History reviewed. No pertinent family history. Family Psychiatric  History: Patient reports a family history of substance abuse. Social History:  History  Alcohol Use No     History  Drug Use  . Frequency: 2.0 times per week  . Types: Cocaine, Marijuana    Comment: Been in detox    Social History  Social History  . Marital status: Single    Spouse name: N/A  . Number of children: N/A  . Years of education: N/A   Social History Main Topics  . Smoking status: Current Every Day Smoker    Packs/day: 0.25    Types: Cigarettes  . Smokeless tobacco: Current User  . Alcohol use No  . Drug use:      Frequency: 2.0 times per week    Types: Cocaine, Marijuana     Comment: Been in detox  . Sexual activity: Yes   Other Topics Concern  . None   Social History Narrative  . None   Additional Social History:    Allergies:  No Known Allergies  Labs: No results found for this or any previous visit (from the past 48 hour(s)).  Current Facility-Administered Medications  Medication Dose Route Frequency Provider Last Rate Last Dose  . acetaminophen (TYLENOL) tablet 650 mg  650 mg Oral Q4H PRN Nadara Mustard, MD      . ALPRAZolam Prudy Feeler) tablet 0.25 mg  0.25 mg Oral BID PRN Nadara Mustard, MD   0.25 mg at 02/12/16 0944  . coconut oil  1 application Topical PRN Nadara Mustard, MD      . witch hazel-glycerin (TUCKS) pad 1 application  1 application Topical PRN Nadara Mustard, MD       And  . dibucaine (NUPERCAINAL) 1 % rectal ointment 1 application  1 application Rectal PRN Nadara Mustard, MD      . diphenhydrAMINE (BENADRYL) capsule 25 mg  25 mg Oral Q6H PRN Nadara Mustard, MD      . docusate sodium (COLACE) capsule 100 mg  100 mg Oral Daily Farrel Conners, CNM   100 mg at 02/11/16 1208  . ferrous sulfate tablet 325 mg  325 mg Oral BID WC Farrel Conners, CNM   325 mg at 02/12/16 0802  . ibuprofen (ADVIL,MOTRIN) tablet 600 mg  600 mg Oral Q6H Nadara Mustard, MD   600 mg at 02/12/16 1141  . LORazepam (ATIVAN) tablet 1 mg  1 mg Oral STAT Xaivier Malay T Margy Sumler, MD      . menthol-cetylpyridinium (CEPACOL) lozenge 3 mg  1 lozenge Oral Q2H PRN Nadara Mustard, MD      . nicotine (NICODERM CQ - dosed in mg/24 hours) patch 14 mg  14 mg Transdermal Daily PRN Nadara Mustard, MD      . oxyCODONE-acetaminophen (PERCOCET/ROXICET) 5-325 MG per tablet 1 tablet  1 tablet Oral Q4H PRN Nadara Mustard, MD      . oxyCODONE-acetaminophen (PERCOCET/ROXICET) 5-325 MG per tablet 2 tablet  2 tablet Oral Q4H PRN Nadara Mustard, MD   2 tablet at 02/12/16 1209  . prenatal multivitamin tablet 1 tablet  1  tablet Oral Q1200 Nadara Mustard, MD   1 tablet at 02/11/16 1208  . senna-docusate (Senokot-S) tablet 2 tablet  2 tablet Oral Q24H Nadara Mustard, MD   2 tablet at 02/11/16 2330  . simethicone (MYLICON) chewable tablet 80 mg  80 mg Oral TID PC Nadara Mustard, MD   80 mg at 02/12/16 0802  . simethicone (MYLICON) chewable tablet 80 mg  80 mg Oral Q24H Nadara Mustard, MD      . simethicone [facility]) chewable tablet 80 mg  80 mg Oral PRN Nadara Mustard, MD      . Tdap Leda Min) injection 0.5 mL  0.5 mL Intramuscular Once Nadara Mustard, MD      .  traZODone (DESYREL) tablet 100 mg  100 mg Oral QHS Audery Amel, MD      . zolpidem (AMBIEN) tablet 5 mg  5 mg Oral QHS PRN Nadara Mustard, MD        Musculoskeletal: Strength & Muscle Tone: within normal limits Gait & Station: normal Patient leans: N/A  Psychiatric Specialty Exam: Physical Exam  Nursing note and vitals reviewed. Constitutional: She appears well-developed and well-nourished.  HENT:  Head: Normocephalic and atraumatic.  Eyes: Conjunctivae are normal. Pupils are equal, round, and reactive to light.  Neck: Normal range of motion.  Cardiovascular: Normal heart sounds.   Respiratory: Effort normal.  GI: Soft.    Musculoskeletal: Normal range of motion.  Neurological: She is alert.  Skin: Skin is warm and dry.  Psychiatric: Her mood appears anxious. Her affect is labile. Her speech is slurred. She is slowed. Cognition and memory are impaired. She expresses impulsivity. She exhibits a depressed mood. She expresses no suicidal ideation.    Review of Systems  HENT: Negative.   Eyes: Negative.   Respiratory: Negative.   Cardiovascular: Negative.   Gastrointestinal: Positive for abdominal pain and nausea.  Musculoskeletal: Negative.   Skin: Negative.   Neurological: Positive for weakness.  Psychiatric/Behavioral: Positive for depression and substance abuse. Negative for hallucinations, memory loss and suicidal ideas. The  patient is nervous/anxious and has insomnia.     Blood pressure 110/69, pulse 80, temperature 98.5 F (36.9 C), temperature source Oral, resp. rate 18, height 4\' 9"  (1.448 m), weight 68 kg (150 lb), SpO2 98 %, unknown if currently breastfeeding.Body mass index is 32.46 kg/m.  General Appearance: Casual  Eye Contact:  Minimal  Speech:  Slow and Slurred  Volume:  Decreased  Mood:  Depressed, Dysphoric and Hopeless  Affect:  Tearful  Thought Process:  Descriptions of Associations: Tangential  Orientation:  Full (Time, Place, and Person)  Thought Content:  Rumination  Suicidal Thoughts:  No  Homicidal Thoughts:  No  Memory:  Immediate;   Fair Recent;   Fair Remote;   Fair  Judgement:  Fair  Insight:  Fair  Psychomotor Activity:  Decreased  Concentration:  Concentration: Fair  Recall:  [employer] of Knowledge:  Fair  Language:  Fair  Akathisia:  No  Handed:  Right  AIMS (if indicated):     Assets:  Desire for Improvement Physical Health  ADL's:  Intact  Cognition:  WNL  Sleep:        Treatment Plan Summary: Daily contact with patient to assess and evaluate symptoms and progress in treatment, Medication management and Plan 26 year old woman with current symptoms of extremely depressed mood, anxiety, sleeplessness, hopelessness, feeling very overwhelmed. Not eating well. Current symptoms are a combination of adjustment to severe stress, possible worsening of what might become a major depression, possible opiate withdrawal. Given the severity of her symptoms as well as the high risk of relapse into dangerous behavior as well as the lack of outpatient support it appears to be to me reasonable to admit her to the psychiatric hospital. She does not meet commitment criteria. I explain the situation to the patient and offered her the opportunity for voluntary admission to psychiatry. She is tentatively agreeable. Reviewed situation with nursing and obstetrics staff. Patient is dependent for  discharge already today. I've spoken with TTS. We will plan on trying to get her discharged and readmitted down to the psychiatry ward if she will agree to it. I have given her a 1 time  dose of Ativan right now. I've given her an order for trazodone at night. If we admitted her to psychiatry O probably give her some more when necessary medicines to cover both opiate withdrawal and anxiety. Supportive counseling with the patient.  Disposition: Recommend psychiatric Inpatient admission when medically cleared. Supportive therapy provided about ongoing stressors.  Mordecai Rasmussen, MD 02/12/2016 12:57 PM

## 2016-02-12 NOTE — Progress Notes (Signed)
Patient is admitted with post partum depression after the C section.Patient is sad & irritable on admission.Reassured & made patient comfortable in bed.Skin assessment & body search done.No contraband found.Dinner offered.

## 2016-02-12 NOTE — Clinical Social Work Note (Signed)
Patient seen by psychiatry last evening and requesting to see them today. Psychiatry has decided to admit patient to [facility]al Medinc[facility] here at [facility]. CSW has coordinated with DSS CPS to have them come to supervisor a visit with her baby prior to going down to Behavioral Medicine. CSW has updated Arelia Sneddon, the foster care worker with DSS regarding patient being admitted to Behavioral Med. CSW received a call from patient's mental health provider: [employer] and updated them as well. Patient's newborn is to discharge today. CSW has informed DSS [facility] worker: Arelia Sneddon [phone]) and she will be bringing up patient's foster parents this evening. She has requested that the nurse call her once patient has left the floor and then they would bring foster parents up to the nursery for baby. CSW has relayed this to patient's newborn's nurse. York Spaniel MSW,LCSW [phone]

## 2016-02-12 NOTE — Progress Notes (Signed)
Infant brought to pt room for supervised visit with DSS present.

## 2016-02-12 NOTE — Progress Notes (Signed)
Obstetric and Gynecology  Subjective  Doing well some abdominal pain, minimal lochia.  Denies feeling lightheaded, dizzy, or palpitations  Objective   Vitals:   02/12/16 1745  BP: 109/77  Pulse: 98  Resp: 18  Temp: 97.8 F (36.6 C)    No intake or output data in the 24 hours ending 02/12/16 1919  General: NAD Cardiovascular: RRR Abdomen: NABS, soft, appropriately tender, mildly distended and tympanic to percussion, incision D/C/I Extremities: no edema  Labs: Results for orders placed or performed during the hospital encounter of 02/12/16 (from the past 24 hour(s))  CBC     Status: Abnormal   Collection Time: 02/12/16  5:55 PM  Result Value Ref Range   WBC 9.4 3.6 - 11.0 K/uL   RBC 1.53 (L) 3.80 - 5.20 MIL/uL   Hemoglobin 4.4 (LL) 12.0 - 16.0 g/dL   HCT 00.8 (LL) 67.6 - 47.0 %   MCV 83.1 80.0 - 100.0 fL   MCH 28.6 26.0 - 34.0 pg   MCHC 34.4 32.0 - 36.0 g/dL   RDW 19.5 (H) 09.3 - 26.7 %   Platelets 248 150 - 440 K/uL    Cultures: Results for orders placed or performed during the hospital encounter of 02/09/16  Culture, beta strep (group b only)     Status: Abnormal   Collection Time: 02/09/16  7:47 AM  Result Value Ref Range Status   Specimen Description VAGINAL/RECTAL  Final   Special Requests NONE  Final   Culture (A)  Final    GROUP B STREP(S.AGALACTIAE)ISOLATED Virtually 100% of S. agalactiae (Group B) strains are susceptible to Penicillin.  For Penicillin-allergic patients, Erythromycin (85-95% sensitive) and Clindamycin (80% sensitive) are drugs of choice. Contact microbiology lab to request sensitivities if  needed within 7 days. Performed at [facility]    Report Status 02/11/2016 FINAL  Final    Imaging:  Assessment   25 y.o. [id number] POD#3 emergent Cesarean with Hgb value of 4.4  Plan   1) Acute blood loss anemia - previous postop Hgb 7.2.  The patient appears hemodynamically stable, is mentating appropriately.  My first recommendation  would be to repeat the lab to verify correct result given clinical picture otherwise does not support a Hgb value of 4.4.  If her Hgb value is indeed found to be 4.4 even in the absence of symptoms I would recommend transferring patient back to the floor to receive a blood transfusion.

## 2016-02-12 NOTE — Progress Notes (Signed)
Tara Raymond from admitting reported that pt stated "will you please go get my baby for me from the nursery?" Tara Raymond reported to staff.

## 2016-02-12 NOTE — Progress Notes (Signed)
Made Dr.Puciloska aware of patients Hb. 4.4.

## 2016-02-12 NOTE — Tx Team (Signed)
Initial Interdisciplinary Treatment Plan   PATIENT STRESSORS: Health problems Substance abuse   PATIENT STRENGTHS: Average or above average intelligence Capable of independent living Communication skills   PROBLEM LIST: Problem List/Patient Goals Date to be addressed Date deferred Reason deferred Estimated date of resolution  Post partum depression 02/12/2016     Substance abuse 02/12/2016                                                DISCHARGE CRITERIA:  Ability to meet basic life and health needs Adequate post-discharge living arrangements  PRELIMINARY DISCHARGE PLAN: Attend aftercare/continuing care group Return to previous living arrangement  PATIENT/FAMIILY INVOLVEMENT: This treatment plan has been presented to and reviewed with the patient, Tara Raymond, and/or family member, .  The patient and family have been given the opportunity to ask questions and make suggestions.  Margo Common Ethen Bannan 02/12/2016, 6:23 PM

## 2016-02-12 NOTE — Progress Notes (Signed)
Pt calm and cooperative. Po bedtime medications given as scheduled. Pt resting in bed reading a magazine. No voiced thoughts of hurting herself.  No AV/H noted. No behavior issues noted at this time.  Spoke with Arna Medici, CN. Pt D/C to mother-baby unit -room 348. Pt received all belongings. Pt escorted to the unit.

## 2016-02-12 NOTE — Progress Notes (Signed)
Pt discharged home without infant.  Discharge instructions and follow up appointment given to and reviewed with pt.  Pt verbalized understanding. Pt to be voluntarily committed to Behavioral Medicine Unit to be followed by Dr. Toni Amend.

## 2016-02-12 NOTE — Discharge Instructions (Signed)
Cesarean Delivery, Care After   Cesarean Delivery, Care After Refer to this sheet in the next few weeks. These instructions provide you with information on caring for yourself after your procedure. Your health care provider may also give you specific instructions. Your treatment has been planned according to current medical practices, but problems sometimes occur. Call your health care provider if you have any problems or questions after you go home. HOME CARE INSTRUCTIONS  If you have an On-Q pump, remove it on the 5th day after your surgery, by removing the dressing/bandage and pulling the pump out. Cover the site where the pump strings came out with a band-aid, as needed.  Only take over-the-counter or prescription medications as directed by your health care provider.  Do not drink alcohol, especially if you are breastfeeding or taking medication to relieve pain.  Do not  smoke tobacco.  Continue to use good perineal care. Good perineal care includes:  Wiping your perineum from front to back.  Keeping your perineum clean.  Check your surgical cut (incision) daily for increased redness, drainage, swelling, or separation of skin.  Shower and clean your incision gently with soap and water every day, by letting warm and soapy water run over the incision, and then pat it dry. If your health care provider says it is okay, leave the incision uncovered. Use a bandage (dressing) if the incision is draining fluid or appears irritated. If the adhesive strips across the incision do not fall off within 7 days, carefully peel them off, after a shower.  Hug a pillow when coughing or sneezing until your incision is healed. This helps to relieve pain.  Do not use tampons, douches or have sexual intercourse for 6 weeks  Wear a well-fitting bra that provides breast support.  Limit wearing support panties or control-top hose.  Drink enough fluids to keep your urine clear or pale yellow.  Eat  high-fiber foods such as whole grain cereals and breads, brown rice, beans, and fresh fruits and vegetables every day. These foods may help prevent or relieve constipation.  Resume activities such as climbing stairs, driving, lifting, exercising, or traveling as directed by your health care provider.  Try to have someone help you with your household activities and your newborn for at least a few days after you leave the hospital.  Rest as much as possible. Try to rest or take a nap when your newborn is sleeping.  Increase your activities gradually.  Do not lift more than 15lbs until directed by a provider.  Keep all of your scheduled postpartum appointments. It is very important to keep your scheduled follow-up appointments. At these appointments, your health care provider will be checking to make sure that you are healing physically and emotionally. SEEK MEDICAL CARE IF:   You are passing large clots from your vagina. Save any clots to show your health care provider.  You have a foul smelling discharge from your vagina.  You have trouble urinating.  You are urinating frequently.  You have pain when you urinate.  You have a change in your bowel movements.  You have increasing redness, pain, or swelling near your incision.  You have pus draining from your incision.  Your incision is separating.  You have painful, hard, or reddened breasts.  You have a severe headache.  You have blurred vision or see spots.  You feel sad or depressed.  You have thoughts of hurting yourself or your newborn.  You have questions about your care,  care, the care of your newborn, or medications. °· You are dizzy or light-headed. °· You have a rash. °· You have pain, redness, or swelling at the site of the removed intravenous access (IV) tube. °· You have nausea or vomiting. °· You stopped breastfeeding and have not had a menstrual period within 12 weeks of stopping. °· You are not  breastfeeding and have not had a menstrual period within 12 weeks of delivery. °· You have a fever. °SEEK IMMEDIATE MEDICAL CARE IF: °· You have persistent pain. °· You have chest pain. °· You have shortness of breath. °· You faint. °· You have leg pain. °· You have stomach pain. °· Your vaginal bleeding saturates 2 or more sanitary pads in 1 hour. °MAKE SURE YOU:  °· Understand these instructions. °· Will watch your condition. °· Will get help right away if you are not doing well or get worse. °Document Released: 03/21/2002 Document Revised: 11/13/2013 Document Reviewed: 02/24/2012 °Exit[employer]® Patient Information ©2015 [employer]. This information is not intended to replace advice given to you by your health care provider. Make sure you discuss any questions you have with your health care provider. ° ° °

## 2016-02-12 NOTE — H&P (Addendum)
Psychiatric Admission Assessment Adult  Patient Identification: Tara Raymond MRN:  [id number] Date of Evaluation:  02/12/2016 Chief Complaint:  Depression Principal Diagnosis: Major depression, single episode Diagnosis:   Patient Active Problem List   Diagnosis Date Noted  . Major depression, single episode [F32.9] 02/12/2016    Priority: High  . Postpartum care following cesarean delivery [Z39.2] 02/12/2016  . Postoperative anemia due to acute blood loss [D62] 02/12/2016  . Adjustment disorder with depressed mood [F43.21] 02/11/2016  . Substance abuse affecting pregnancy in third trimester, antepartum [K56.256, F19.10] 02/09/2016  . Labor and delivery, indication for care [O75.9] 02/03/2016  . Substance induced mood disorder ([place]) [F19.94] 01/28/2016  . Severe major depression, single episode, without psychotic features ([place]) [F32.2] 01/28/2016  . Cocaine use disorder, moderate, dependence ([place]) [F14.20] 01/28/2016  . Opioid use disorder, moderate, dependence ([place]) [F11.20] 01/28/2016  . Opiate withdrawal ([place]) [F11.23] 01/28/2016   History of Present Illness:   I DID NOT PERSONALLY MET OR EXAMINED THE PATIENT. I AM USING CONSULTATION NOTE PROVIDED BY DR. Weber Cooks EARLIER TODAY TO COMPLETE ADMISSION.   Tara Raymond was transfer to psychiatry from labor/deliveryearlier this afternoon to treat depression. Soon following transfer we realized that her Hg was dangerously low. I contacted Dr. Georgianne Fick, OB/GYN on call, who decided that the patient needs blood transfusion. She will be transferred back to labor/deivery tonight for further treatment. Dr. Danielle Rankin help is greatly appreciated.   Reason for Consult:  Follow-up care for this 25 year old woman with a history of substance abuse multiple major stresses acute worsening of mood symptoms Referring Physician:  Kenton Kingfisher Patient Identification: Tara Raymond MRN:  [id number] Principal Diagnosis: Adjustment disorder with depressed  mood Diagnosis:       Patient Active Problem List   Diagnosis Date Noted  . Adjustment disorder with depressed mood [F43.21] 02/11/2016  . Substance abuse affecting pregnancy in third trimester, antepartum [J68.115, F19.10] 02/09/2016  . Labor and delivery, indication for care [O75.9] 02/03/2016  . Substance induced mood disorder ([place]) [F19.94] 01/28/2016  . Severe major depression, single episode, without psychotic features ([place]) [F32.2] 01/28/2016  . Cocaine abuse [F14.10] 01/28/2016  . Opiate abuse, continuous [F11.10] 01/28/2016  . Opiate withdrawal ([place]) [F11.23] 01/28/2016    Total Time spent with patient: 30 minutes  Subjective:   Tara Raymond is a 25 y.o. female patient admitted with "I just can't stop crying".  HPI:  Patient interviewed. Chart reviewed. Case reviewed with nursing staff on the mother baby ward. Patient has been having more and more mood symptoms over the last day. She feels very depressed down and hopeless. She has been unable to stop crying. She can't sleep at night. Has had nightmares and is constantly ruminating about her children. Patient denies any acute suicidal intent but is unable to think very clearly about a plan for the future. She has not been able to eat well. She is having a lot of pain probably even more than would be expected from the cesarean section. Patient is feeling panicky it seems especially in light of discharge home. Does not feel like she really has any support.  Medical history: 4 days after a uncomplicated cesarean delivery. History of opiate dependence and probably still having some withdrawal symptoms.  Social history: Apparently she can go live with her mother after discharge but that sounds like it's not the most welcoming her supportive environment. Patient had previously been living on the street and living by prostitution and heroin abuse. Not clear whether she really  has a safe stable place to stay. Social services is  involved with the newborn already which is upsetting to the patient.  Substance abuse history: History of active abuse of heroin and cocaine and other drugs. She had relapsed after her last stay in the emergency room and is probably back to having at least some degree of opiate withdrawal symptoms.  Past Psychiatric History: No history of psychiatric hospitalization. No history of suicide attempts. Never really has had any mental health treatment other than the stay that we had in the hospital recently.  Risk to Self: Is patient at risk for suicide?: No Risk to Others:   Prior Inpatient Therapy:   Prior Outpatient Therapy:    Past Medical History:      Past Medical History:  Diagnosis Date  . Mental disorder          Past Surgical History:  Procedure Laterality Date  . CESAREAN SECTION N/A 02/09/2016   Procedure: CESAREAN SECTION;  Surgeon: Gae Dry, MD;  Location: [facility] ORS;  Service: Obstetrics;  Laterality: N/A;  . NO PAST SURGERIES     Family History: History reviewed. No pertinent family history. Family Psychiatric  History: Patient reports a family history of substance abuse. Social History:     History  Alcohol Use No          History  Drug Use  . Frequency: 2.0 times per week  . Types: Cocaine, Marijuana    Comment: Been in detox    Social History        Social History  . Marital status: Single    Spouse name: N/A  . Number of children: N/A  . Years of education: N/A         Social History Main Topics  . Smoking status: Current Every Day Smoker    Packs/day: 0.25    Types: Cigarettes  . Smokeless tobacco: Current User  . Alcohol use No  . Drug use:     Frequency: 2.0 times per week    Types: Cocaine, Marijuana     Comment: Been in detox  . Sexual activity: Yes       Other Topics Concern  . None      Social History Narrative  . None   Additional Social History:    Allergies:  No Known Allergies  Labs:   Lab Results Last 48 Hours  No results found for this or any previous visit (from the past 40 hour(s)).             Current Facility-Administered Medications  Medication Dose Route Frequency Provider Last Rate Last Dose  . acetaminophen (TYLENOL) tablet 650 mg  650 mg Oral Q4H PRN Gae Dry, MD      . ALPRAZolam Duanne Moron) tablet 0.25 mg  0.25 mg Oral BID PRN Gae Dry, MD   0.25 mg at 02/12/16 0944  . coconut oil  1 application Topical PRN Gae Dry, MD      . witch hazel-glycerin (TUCKS) pad 1 application  1 application Topical PRN Gae Dry, MD       And  . dibucaine (NUPERCAINAL) 1 % rectal ointment 1 application  1 application Rectal PRN Gae Dry, MD      . diphenhydrAMINE (BENADRYL) capsule 25 mg  25 mg Oral Q6H PRN Gae Dry, MD      . docusate sodium (COLACE) capsule 100 mg  100 mg Oral Daily Dalia Heading, CNM   100 mg  at 02/11/16 1208  . ferrous sulfate tablet 325 mg  325 mg Oral BID WC Dalia Heading, CNM   325 mg at 02/12/16 0802  . ibuprofen (ADVIL,MOTRIN) tablet 600 mg  600 mg Oral Q6H Gae Dry, MD   600 mg at 02/12/16 1141  . LORazepam (ATIVAN) tablet 1 mg  1 mg Oral STAT John T Clapacs, MD      . menthol-cetylpyridinium (CEPACOL) lozenge 3 mg  1 lozenge Oral Q2H PRN Gae Dry, MD      . nicotine (NICODERM CQ - dosed in mg/24 hours) patch 14 mg  14 mg Transdermal Daily PRN Gae Dry, MD      . oxyCODONE-acetaminophen (PERCOCET/ROXICET) 5-325 MG per tablet 1 tablet  1 tablet Oral Q4H PRN Gae Dry, MD      . oxyCODONE-acetaminophen (PERCOCET/ROXICET) 5-325 MG per tablet 2 tablet  2 tablet Oral Q4H PRN Gae Dry, MD   2 tablet at 02/12/16 1209  . prenatal multivitamin tablet 1 tablet  1 tablet Oral Q1200 Gae Dry, MD   1 tablet at 02/11/16 1208  . senna-docusate (Senokot-S) tablet 2 tablet  2 tablet Oral Q24H Gae Dry, MD   2 tablet at 02/11/16 2330  . simethicone (MYLICON) chewable tablet 80  mg  80 mg Oral TID PC Gae Dry, MD   80 mg at 02/12/16 0802  . simethicone (MYLICON) chewable tablet 80 mg  80 mg Oral Q24H Gae Dry, MD      . simethicone [facility]) chewable tablet 80 mg  80 mg Oral PRN Gae Dry, MD      . Tdap Durwin Reges) injection 0.5 mL  0.5 mL Intramuscular Once Gae Dry, MD      . traZODone (DESYREL) tablet 100 mg  100 mg Oral QHS Gonzella Lex, MD      . zolpidem (AMBIEN) tablet 5 mg  5 mg Oral QHS PRN Gae Dry, MD        Musculoskeletal: Strength & Muscle Tone: within normal limits Gait & Station: normal Patient leans: N/A  Psychiatric Specialty Exam: Physical Exam  Nursing note and vitals reviewed. Constitutional: She appears well-developed and well-nourished.  HENT:  Head: Normocephalic and atraumatic.  Eyes: Conjunctivae are normal. Pupils are equal, round, and reactive to light.  Neck: Normal range of motion.  Cardiovascular: Normal heart sounds.   Respiratory: Effort normal.  GI: Soft.    Musculoskeletal: Normal range of motion.  Neurological: She is alert.  Skin: Skin is warm and dry.  Psychiatric: Her mood appears anxious. Her affect is labile. Her speech is slurred. She is slowed. Cognition and memory are impaired. She expresses impulsivity. She exhibits a depressed mood. She expresses no suicidal ideation.    Review of Systems  HENT: Negative.   Eyes: Negative.   Respiratory: Negative.   Cardiovascular: Negative.   Gastrointestinal: Positive for abdominal pain and nausea.  Musculoskeletal: Negative.   Skin: Negative.   Neurological: Positive for weakness.  Psychiatric/Behavioral: Positive for depression and substance abuse. Negative for hallucinations, memory loss and suicidal ideas. The patient is nervous/anxious and has insomnia.     Blood pressure 110/69, pulse 80, temperature 98.5 F (36.9 C), temperature source Oral, resp. rate 18, height _0  (1.448 m), weight 68 kg (150 lb), SpO2 98 %, unknown  if currently breastfeeding.Body mass index is 32.46 kg/m.  General Appearance: Casual  Eye Contact:  Minimal  Speech:  Slow and Slurred  Volume:  Decreased  Mood:  Depressed, Dysphoric and Hopeless  Affect:  Tearful  Thought Process:  Descriptions of Associations: Tangential  Orientation:  Full (Time, Place, and Person)  Thought Content:  Rumination  Suicidal Thoughts:  No  Homicidal Thoughts:  No  Memory:  Immediate;   Fair Recent;   Fair Remote;   Fair  Judgement:  Fair  Insight:  Fair  Psychomotor Activity:  Decreased  Concentration:  Concentration: Fair  Recall:  [employer] of Knowledge:  Fair  Language:  Fair  Akathisia:  No  Handed:  Right  AIMS (if indicated):     Assets:  Desire for Improvement Physical Health  ADL's:  Intact  Cognition:  WNL  Sleep:        Treatment Plan Summary: Daily contact with patient to assess and evaluate symptoms and progress in treatment, Medication management and Plan 25 year old woman with current symptoms of extremely depressed mood, anxiety, sleeplessness, hopelessness, feeling very overwhelmed. Not eating well. Current symptoms are a combination of adjustment to severe stress, possible worsening of what might become a major depression, possible opiate withdrawal. Given the severity of her symptoms as well as the high risk of relapse into dangerous behavior as well as the lack of outpatient support it appears to be to me reasonable to admit her to the psychiatric hospital. She does not meet commitment criteria. I explain the situation to the patient and offered her the opportunity for voluntary admission to psychiatry. She is tentatively agreeable. Reviewed situation with nursing and obstetrics staff. Patient is dependent for discharge already today. I've spoken with TTS. We will plan on trying to get her discharged and readmitted down to the psychiatry ward if she will agree to it. I have given her a 1 time dose of Ativan right now. I've  given her an order for trazodone at night. If we admitted her to psychiatry O probably give her some more when necessary medicines to cover both opiate withdrawal and anxiety. Supportive counseling with the patient.  Disposition: Recommend psychiatric Inpatient admission when medically cleared. Supportive therapy provided about ongoing stressors.  Alethia Berthold, MD 02/12/2016 12:57 PM   Past Psychiatric History: substance abuse.  Is the patient at risk to self? No.  Has the patient been a risk to self in the past 6 months? No.  Has the patient been a risk to self within the distant past? No.  Is the patient a risk to others? No.  Has the patient been a risk to others in the past 6 months? No.  Has the patient been a risk to others within the distant past? No.   Prior Inpatient Therapy:   Prior Outpatient Therapy:    Alcohol Screening: 1. How often do you have a drink containing alcohol?: Never 2. How many drinks containing alcohol do you have on a typical day when you are drinking?: 1 or 2 3. How often do you have six or more drinks on one occasion?: Never Preliminary Score: 0 4. How often during the last year have you found that you were not able to stop drinking once you had started?: Never 5. How often during the last year have you failed to do what was normally expected from you becasue of drinking?: Never 6. How often during the last year have you needed a first drink in the morning to get yourself going after a heavy drinking session?: Never 7. How often during the last year have you had a feeling of guilt of remorse  after drinking?: Never 8. How often during the last year have you been unable to remember what happened the night before because you had been drinking?: Never 9. Have you or someone else been injured as a result of your drinking?: No 10. Has a relative or friend or a doctor or another health worker been concerned about your drinking or suggested you cut down?:  No Alcohol Use Disorder Identification Test Final Score (AUDIT): 0 Brief Intervention: AUDIT score less than 7 or less-screening does not suggest unhealthy drinking-brief intervention not indicated Substance Abuse History in the last 12 months:  Yes.   Consequences of Substance Abuse: Negative Previous Psychotropic Medications: No  Psychological Evaluations: No  Past Medical History:  Past Medical History:  Diagnosis Date  . Mental disorder     Past Surgical History:  Procedure Laterality Date  . CESAREAN SECTION N/A 02/09/2016   Procedure: CESAREAN SECTION;  Surgeon: Gae Dry, MD;  Location: [facility] ORS;  Service: Obstetrics;  Laterality: N/A;  . NO PAST SURGERIES     Family History: History reviewed. No pertinent family history. Family Psychiatric  History: History of substance abuse. Tobacco Screening: Have you used any form of tobacco in the last 30 days? (Cigarettes, Smokeless Tobacco, Cigars, and/or Pipes): Yes Tobacco use, Select all that apply: 5 or more cigarettes per day Are you interested in Tobacco Cessation Medications?: No, patient refused Counseled patient on smoking cessation including recognizing danger situations, developing coping skills and basic information about quitting provided: Yes Social History:  History  Alcohol Use No     History  Drug Use  . Frequency: 2.0 times per week  . Types: Cocaine, Marijuana    Comment: Been in detox    Additional Social History:      History of alcohol / drug use?: Yes Name of Substance 1: Cocaine 1 - Age of First Use: 22 yrs 1 - Amount (size/oz): "as much as I get" 1 - Frequency: daily                 Allergies:  No Known Allergies Lab Results:  Results for orders placed or performed during the hospital encounter of 02/12/16 (from the past 48 hour(s))  CBC     Status: Abnormal   Collection Time: 02/12/16  5:55 PM  Result Value Ref Range   WBC 9.4 3.6 - 11.0 K/uL   RBC 1.53 (L) 3.80 - 5.20 MIL/uL    Hemoglobin 4.4 (LL) 12.0 - 16.0 g/dL    Comment: CRITICAL RESULT CALLED TO, READ BACK BY AND VERIFIED WITH: GIGI MANIATTU AT 1826 02/12/2016 BY TFK    HCT 12.7 (LL) 35.0 - 47.0 %    Comment: CRITICAL RESULT CALLED TO, READ BACK BY AND VERIFIED WITH: GIGI MANIATTU AT 1826 02/12/2016 BY TFK    MCV 83.1 80.0 - 100.0 fL   MCH 28.6 26.0 - 34.0 pg   MCHC 34.4 32.0 - 36.0 g/dL   RDW 16.3 (H) 11.5 - 14.5 %   Platelets 248 150 - 440 K/uL  CBC     Status: Abnormal   Collection Time: 02/12/16  8:12 PM  Result Value Ref Range   WBC 9.4 3.6 - 11.0 K/uL   RBC 1.54 (L) 3.80 - 5.20 MIL/uL   Hemoglobin 4.3 (LL) 12.0 - 16.0 g/dL    Comment: CRITICAL RESULT CALLED TO, READ BACK BY AND VERIFIED WITH: CHRISTINE THOMPSON AT 2054 02/12/2016 BY TFK    HCT 12.8 (LL) 35.0 - 47.0 %  Comment: CRITICAL RESULT CALLED TO, READ BACK BY AND VERIFIED WITH: CHRISTINE THOMPSON AT 2054 02/12/2016 BY TFK    MCV 83.3 80.0 - 100.0 fL   MCH 28.3 26.0 - 34.0 pg   MCHC 34.0 32.0 - 36.0 g/dL   RDW 16.4 (H) 11.5 - 14.5 %   Platelets 264 150 - 440 K/uL    Blood Alcohol level:  Lab Results  Component Value Date   ETH <5 32/35/5732    Metabolic Disorder Labs:  No results found for: HGBA1C, MPG No results found for: PROLACTIN No results found for: CHOL, TRIG, HDL, CHOLHDL, VLDL, LDLCALC  Current Medications: Current Facility-Administered Medications  Medication Dose Route Frequency Provider Last Rate Last Dose  . acetaminophen (TYLENOL) tablet 650 mg  650 mg Oral Q4H PRN Gonzella Lex, MD      . alum & mag hydroxide-simeth (MAALOX/MYLANTA) 200-200-20 MG/5ML suspension 30 mL  30 mL Oral Q4H PRN Gonzella Lex, MD      . coconut oil  1 application Topical PRN Gonzella Lex, MD      . witch hazel-glycerin (TUCKS) pad 1 application  1 application Topical PRN Gonzella Lex, MD       And  . dibucaine (NUPERCAINAL) 1 % rectal ointment 1 application  1 application Rectal PRN Gonzella Lex, MD      .  diphenhydrAMINE (BENADRYL) capsule 25 mg  25 mg Oral Q6H PRN Gonzella Lex, MD      . Derrill Memo ON 02/13/2016] docusate sodium (COLACE) capsule 100 mg  100 mg Oral Daily Gonzella Lex, MD      . ferrous sulfate tablet 325 mg  325 mg Oral BID WC Gonzella Lex, MD   325 mg at 02/12/16 1837  . ibuprofen (ADVIL,MOTRIN) tablet 600 mg  600 mg Oral Q6H Gonzella Lex, MD   600 mg at 02/12/16 1837  . magnesium hydroxide (MILK OF MAGNESIA) suspension 30 mL  30 mL Oral Daily PRN Gonzella Lex, MD      . menthol-cetylpyridinium (CEPACOL) lozenge 3 mg  1 lozenge Oral Q2H PRN Gonzella Lex, MD      . methocarbamol (ROBAXIN) tablet 750 mg  750 mg Oral Q6H PRN Gonzella Lex, MD      . nicotine (NICODERM CQ - dosed in mg/24 hours) patch 14 mg  14 mg Transdermal Daily PRN Gonzella Lex, MD      . ondansetron (ZOFRAN) tablet 4 mg  4 mg Oral Q8H PRN Gonzella Lex, MD      . oxyCODONE-acetaminophen (PERCOCET/ROXICET) 5-325 MG per tablet 1 tablet  1 tablet Oral Q4H PRN Gonzella Lex, MD   1 tablet at 02/12/16 2005  . [START ON 02/13/2016] prenatal multivitamin tablet 1 tablet  1 tablet Oral Q1200 Gonzella Lex, MD      . Derrill Memo ON 02/13/2016] senna-docusate (Senokot-S) tablet 2 tablet  2 tablet Oral Q24H Gonzella Lex, MD      . simethicone [facility]) chewable tablet 80 mg  80 mg Oral TID PC Gonzella Lex, MD   80 mg at 02/12/16 1837  . traZODone (DESYREL) tablet 100 mg  100 mg Oral QHS Gonzella Lex, MD      . zolpidem (AMBIEN) tablet 5 mg  5 mg Oral QHS PRN Gonzella Lex, MD       PTA Medications: No prescriptions prior to admission.    Psychiatric Specialty Exam: Physical Exam  Nursing note and  vitals reviewed.   Review of Systems  Unable to perform ROS: Other    Blood pressure 115/61, pulse (!) 115, temperature 98.3 F (36.8 C), resp. rate 16, height _0  (1.473 m), weight 64.9 kg (143 lb), SpO2 99 %, unknown if currently breastfeeding.Body mass index is 29.89 kg/m.                                                        Treatment Plan Summary: Daily contact with patient to assess and evaluate symptoms and progress in treatment and Medication management   Ms. Badertscher is a 25 year old female with a history of substance abuse transferred to psychiatry on the third dy after cesarean section for treatment of depression.   1. [place]. Hg was low today afternoon at 4.4 and 4.3 on repeat with stable vital signs. The patient was seen by Dr. Georgianne Fick and transferred back to labor delivery for transfusion.  Observation Level/Precautions:  15 minute checks  Laboratory:  CBC  Psychotherapy:    Medications:    Consultations:    Discharge Concerns:    Estimated LOS:  Other:     I certify that inpatient services furnished can reasonably be expected to improve the patient's condition.    Orson Slick, MD 8/2/20179:16 PM

## 2016-02-12 NOTE — Progress Notes (Signed)
Patient remains irritable and demanding with care. C/o abd pain. Pain scale 7/10.  Percocet 1 tab given PRN at 2005. Labs obtained for stat CBC. Results  Hemoglobin 4.3, Hct 12.5. B/P 124/73, hr 111, RR 16, refused temp. Dr. Bonney Aid notified. N.O. Transfer pt to medical unit. Pt resting in bed reading. No s/s of acute distress noted. Will continue to monitor for safety and behavior.

## 2016-02-12 NOTE — Plan of Care (Signed)
Problem: Pain Managment: Goal: General experience of comfort will improve Outcome: Not Met (add Reason) C/o abd pain. Pain scale 7/10. PRN given for pain mgmt. S/e and adverse reaction discussed. Questions encouraged. q 15 min checks maintained for safety. Will continue to monitor for behavior and safety.

## 2016-02-12 NOTE — Progress Notes (Signed)
Repeat Hgb received and noted to be 4.3, even though patient asymptomatic given level recommend transfusion which had previously been discussed with patient when initial level returned.  Transfer order placed floor informed of plan of care.  Will plan on 2 units of pRBC

## 2016-02-13 ENCOUNTER — Encounter: Payer: Self-pay | Admitting: Certified Nurse Midwife

## 2016-02-13 DIAGNOSIS — D62 Acute posthemorrhagic anemia: Secondary | ICD-10-CM | POA: Diagnosis not present

## 2016-02-13 LAB — CBC WITH DIFFERENTIAL/PLATELET
BASOS ABS: 0 10*3/uL (ref 0–0.1)
BLASTS: 0 %
Band Neutrophils: 0 %
Basophils Relative: 0 %
EOS PCT: 0 %
Eosinophils Absolute: 0 10*3/uL (ref 0–0.7)
HCT: 21.9 % — ABNORMAL LOW (ref 35.0–47.0)
Hemoglobin: 7.5 g/dL — ABNORMAL LOW (ref 12.0–16.0)
LYMPHS ABS: 2 10*3/uL (ref 1.0–3.6)
Lymphocytes Relative: 20 %
MCH: 29 pg (ref 26.0–34.0)
MCHC: 34.2 g/dL (ref 32.0–36.0)
MCV: 84.9 fL (ref 80.0–100.0)
METAMYELOCYTES PCT: 0 %
MYELOCYTES: 0 %
Monocytes Absolute: 0.5 10*3/uL (ref 0.2–0.9)
Monocytes Relative: 5 %
Neutro Abs: 7.7 10*3/uL — ABNORMAL HIGH (ref 1.4–6.5)
Neutrophils Relative %: 75 %
Other: 0 %
PLATELETS: 281 10*3/uL (ref 150–440)
PROMYELOCYTES ABS: 0 %
RBC: 2.58 MIL/uL — AB (ref 3.80–5.20)
RDW: 16 % — ABNORMAL HIGH (ref 11.5–14.5)
WBC: 10.2 10*3/uL (ref 3.6–11.0)
nRBC: 2 /100 WBC — ABNORMAL HIGH

## 2016-02-13 LAB — PREPARE RBC (CROSSMATCH)

## 2016-02-13 MED ORDER — PRENATAL MULTIVITAMIN CH
1.0000 | ORAL_TABLET | Freq: Every day | ORAL | Status: DC
  Administered 2016-02-14: 1 via ORAL
  Filled 2016-02-13: qty 1

## 2016-02-13 MED ORDER — DIPHENHYDRAMINE HCL 25 MG PO CAPS
25.0000 mg | ORAL_CAPSULE | Freq: Once | ORAL | Status: AC
  Administered 2016-02-13: 25 mg via ORAL
  Filled 2016-02-13: qty 1

## 2016-02-13 MED ORDER — IBUPROFEN 600 MG PO TABS
600.0000 mg | ORAL_TABLET | Freq: Four times a day (QID) | ORAL | Status: DC | PRN
  Administered 2016-02-14 (×3): 600 mg via ORAL
  Filled 2016-02-13 (×3): qty 1

## 2016-02-13 MED ORDER — ACETAMINOPHEN 325 MG PO TABS
650.0000 mg | ORAL_TABLET | Freq: Once | ORAL | Status: AC
Start: 2016-02-13 — End: 2016-02-13
  Administered 2016-02-13: 325 mg via ORAL
  Filled 2016-02-13: qty 1

## 2016-02-13 MED ORDER — FERROUS SULFATE 325 (65 FE) MG PO TABS
325.0000 mg | ORAL_TABLET | Freq: Two times a day (BID) | ORAL | Status: DC
  Administered 2016-02-14: 325 mg via ORAL
  Filled 2016-02-13: qty 1

## 2016-02-13 MED ORDER — SODIUM CHLORIDE 0.9 % IV SOLN
Freq: Once | INTRAVENOUS | Status: AC
  Administered 2016-02-13: 01:00:00 via INTRAVENOUS

## 2016-02-13 MED ORDER — SENNOSIDES-DOCUSATE SODIUM 8.6-50 MG PO TABS
2.0000 | ORAL_TABLET | Freq: Every day | ORAL | Status: DC
  Administered 2016-02-13 – 2016-02-14 (×2): 2 via ORAL
  Filled 2016-02-13 (×2): qty 2

## 2016-02-13 MED ORDER — OXYCODONE-ACETAMINOPHEN 5-325 MG PO TABS
1.0000 | ORAL_TABLET | Freq: Four times a day (QID) | ORAL | Status: DC | PRN
  Administered 2016-02-13: 1 via ORAL
  Administered 2016-02-13 – 2016-02-14 (×6): 2 via ORAL
  Filled 2016-02-13 (×6): qty 2
  Filled 2016-02-13: qty 1
  Filled 2016-02-13 (×2): qty 2

## 2016-02-13 NOTE — Progress Notes (Signed)
Pt admitted to mother baby unit for blood transfusion due to hemoglobin of 4.3.  Pt  Somewhat cooperative with care at times.  Pt c/o being cold and shivering with several blankets covering her.  Pt refused temp to be taken when starting second unit of blood as well as at the mark after blood being started.  Pt c/o procedures and care not being explained however pt not willing to listen to explantations and constantly interrupting. Pt constantly making rude comments as well as making obscene gestures to staff.  Vital signs that were able to be obtained are within normal limits. Currently sleeping receiving second unit of blood.  Hemoglobin is to be recheck 4hrs post transfusion.

## 2016-02-13 NOTE — Progress Notes (Signed)
Subjective:  Doing well doing better from mood standpoint.  Minimal lochia.  Feels better than yesterday  Objective:  Blood pressure 136/69, pulse (!) 124, temperature 98.5 F (36.9 C), temperature source Oral, resp. rate 18, height 4\' 9"  (1.448 m), SpO2 97 %, unknown if currently breastfeeding.  General: NAD Pulmonary: no increased work of breathing Abdomen: non-distended, non-tender, fundus firm at level of umbilicus Incision: D/C/I dressing in place Extremities: no edema, no erythema, no tenderness  Results for orders placed or performed during the hospital encounter of 02/12/16 (from the past 72 hour(s))  Prepare RBC     Status: None   Collection Time: 02/13/16  1:28 AM  Result Value Ref Range   Order Confirmation ORDER PROCESSED BY BLOOD BANK   Type and screen [facility]     Status: None (Preliminary result)   Collection Time: 02/13/16  1:28 AM  Result Value Ref Range   ABO/RH(D) A POS    Antibody Screen NEG    Sample Expiration 02/16/2016    Unit Number [id number]    Blood Component Type RED CELLS,LR    Unit division 00    Status of Unit ISSUED    Transfusion Status OK TO TRANSFUSE    Crossmatch Result Compatible    Unit Number [id number]    Blood Component Type RBC, LR IRR    Unit division 00    Status of Unit ISSUED    Transfusion Status OK TO TRANSFUSE    Crossmatch Result Compatible   CBC with Differential/Platelet     Status: Abnormal   Collection Time: 02/13/16 11:15 AM  Result Value Ref Range   WBC 10.2 3.6 - 11.0 K/uL   RBC 2.58 (L) 3.80 - 5.20 MIL/uL   Hemoglobin 7.5 (L) 12.0 - 16.0 g/dL    Comment: RESULT REPEATED AND VERIFIED   HCT 21.9 (L) 35.0 - 47.0 %   MCV 84.9 80.0 - 100.0 fL   MCH 29.0 26.0 - 34.0 pg   MCHC 34.2 32.0 - 36.0 g/dL   RDW 31.1 (H) 21.6 - 24.4 %   Platelets 281 150 - 440 K/uL   Neutrophils Relative % 75 %   Lymphocytes Relative 20 %   Monocytes Relative 5 %   Eosinophils Relative 0 %   Basophils  Relative 0 %   Band Neutrophils 0 %   Metamyelocytes Relative 0 %   Myelocytes 0 %   Promyelocytes Absolute 0 %   Blasts 0 %   nRBC 2 (H) 0 /100 WBC   Other 0 %   Neutro Abs 7.7 (H) 1.4 - 6.5 K/uL   Lymphs Abs 2.0 1.0 - 3.6 K/uL   Monocytes Absolute 0.5 0.2 - 0.9 K/uL   Eosinophils Absolute 0.0 0 - 0.7 K/uL   Basophils Absolute 0.0 0 - 0.1 K/uL   RBC Morphology RARE NRBCs      Assessment:   25 y.o. [id number] postoperativeday #4 1LTCS for fetal intolerance of labor, readmission for Hgb of 4.3   Plan:  1) Acute blood loss anemia - hemodynamically stable and asymptomatic, appropriate rise in H&H after 2 units of pRBC - po ferrous sulfate - I will monitor the patient overnight to verify stable repeat H&H tomorrow, she is in agreement with this - no evidence of acute or ongoing bleeding  2) Depression/adjustment disorder - seen by psychiatry yesterday, did not meet committment criteria but was admitted voluntarily.  She feels better and declines repeat admission at this point.  I will  contact to arrange outpatient follow up and recommendation for any discharge medications.  3) Disposition - likely home tomorrow if vital and H&H remain stable.  Is planning to stay with her mother

## 2016-02-13 NOTE — Clinical Social Work Note (Signed)
Patient was readmitted from [facility] unit early this morning for a low hemoglobin. As we will need to coordinate getting patient's foster care mother and DSS into the nursery in order for baby to discharge, CSW has contacted Arelia Sneddon, the DSS [facility] and asked her to let this CSW know an exact time that they would be arriving. CSW has spoken with both patient's nurse and patient's newborn's nurse this morning. York Spaniel MSW,LCSW [phone]

## 2016-02-13 NOTE — H&P (Signed)
Obstetrics Admission History & Physical  02/13/2016 - 12:52 AM Primary OBGYN: [facility]  Chief Complaint:I am here for a blood transfusion  History of Present Illness  25 y.o. [id number] was discharged from the [facility] unit yesterday after having a primary Cesarean section on July 30 for fetal intolerance to labor. She was then admitted to the Behavioral Health unit for treatment of her depression (DSS has custody of newborn) and opiate withdrawal. Postoperative hemoglobin on 31 July  was 7.2 gm/dl and she was begun on iron supplements. She refused further blood draws while on the postpartum unit. She did have a CBC on admission to [facility] and her hemoglobin was 4.3 gm/dl with a hematocrit of 45.4%. Her vital signs remained stable with only occasional mild tachycardia postpartum. She denied lightheadedness yesterday, but now admits to being lightheaded and having a frontal headache yesterday. Her bleeding (lochia) is light. With the severe anemia and recovering from surgery, she was counseled on benefits of having a blood transfusion. She  agreed to be admitted back to the [facility] unit for a blood transfusion.    Review of Systems: Positive for lightheadedness, headache, depression, mood swings.  Pertinent negative: heavy vaginal bleeding. Patient Active Problem List   Diagnosis Date Noted  . Anemia associated with acute blood loss 02/13/2016  . Postpartum care following cesarean delivery 02/12/2016  . Major depression, single episode 02/12/2016  . Postoperative anemia due to acute blood loss 02/12/2016  . Adjustment disorder with depressed mood 02/11/2016  . Substance abuse affecting pregnancy in third trimester, antepartum 02/09/2016  . Labor and delivery, indication for care 02/03/2016  . Substance induced mood disorder (HCC) 01/28/2016  . Severe major depression, single episode, without psychotic features (HCC) 01/28/2016  . Cocaine use disorder, moderate, dependence (HCC) 01/28/2016  .  Opioid use disorder, moderate, dependence (HCC) 01/28/2016  . Opiate withdrawal (HCC) 01/28/2016      PSHx:  Past Surgical History:  Procedure Laterality Date  . CESAREAN SECTION N/A 02/09/2016   Procedure: CESAREAN SECTION;  Surgeon: Nadara Mustard, MD;  Location: [facility] ORS;  Service: Obstetrics;  Laterality: N/A;   Medications:  No prescriptions prior to admission.     Allergies: has No Known Allergies. OBHx:  OB History  Gravida Para Term Preterm AB Living  SAB TAB Ectopic Multiple Live Births        0 3    # Outcome Date GA Lbr Len/2nd Weight Sex Delivery Anes PTL Lv  3 Term 02/09/16 [redacted]w[redacted]d  3.1 kg (6 lb 13.4 oz) M CS-LTranv Spinal  LIV  2 Term           1 Term              Soc Hx:  Social History   Social History  . Marital status: Single    Spouse name: N/A  . Number of children: N/A  . Years of education: N/A   Occupational History  . Not on file.   Social History Main Topics  . Smoking status: Current Every Day Smoker    Packs/day: 0.25    Types: Cigarettes  . Smokeless tobacco: Current User  . Alcohol use No  . Drug use:     Frequency: 2.0 times per week    Types: Cocaine, Marijuana, Heroin     Comment: Been in detox  . Sexual activity: Yes   Other Topics Concern  . Not on file   Social History  Narrative  . No narrative on file    Objective   General: blood shot eyes, appears tired Skin:  Warm and dry.  Cardiovascular: RRR with grade II/VI systolic murmur at LSB Respiratory:  Clear to auscultation bilateral. Normal respiratory effort Abdomen: bowel sounds present. Soft, incision C&D&I (honeycomb dressing intact) Neuro/Psych: depressed mood and affect     Recent Labs Lab 02/09/16 1805 02/12/16 1755 02/12/16 2012  WBC 16.4* 9.4 9.4  HGB 7.2* 4.4* 4.3*  HCT 22.5* 12.7* 12.8*  PLT 185 248 264     Recent Labs Lab 02/09/16 0743  NA 135  K 3.2*  CL 104  CO2 23  BUN <5*  CREATININE 0.56  CALCIUM 8.2*  PROT 6.4*   BILITOT 0.4  ALKPHOS 195*  ALT 47  AST 73*  GLUCOSE 109*    Radiology   Perinatal info  A POS/  Assessment & Plan  A: Severe anemia (probably chronic iron deficiency with worsening due to blood loss from surgery) POD #4 s/p Cesarean section for FITL Opiate dependency/ substance abuse Depression P: Transfuse 2 units Repeat CBC 4 hours after transfusion complete Plan on readmission to Behavior health after transfusion. Has been counseled on the pros and cons of blood transfusion by Dr Everlena Cooper, CNM

## 2016-02-14 DIAGNOSIS — D62 Acute posthemorrhagic anemia: Secondary | ICD-10-CM | POA: Diagnosis not present

## 2016-02-14 LAB — CBC
HCT: 21.8 % — ABNORMAL LOW (ref 35.0–47.0)
HEMOGLOBIN: 7.5 g/dL — AB (ref 12.0–16.0)
MCH: 29.3 pg (ref 26.0–34.0)
MCHC: 34.6 g/dL (ref 32.0–36.0)
MCV: 84.7 fL (ref 80.0–100.0)
PLATELETS: 295 10*3/uL (ref 150–440)
RBC: 2.57 MIL/uL — ABNORMAL LOW (ref 3.80–5.20)
RDW: 16 % — ABNORMAL HIGH (ref 11.5–14.5)
WBC: 11.4 10*3/uL — ABNORMAL HIGH (ref 3.6–11.0)

## 2016-02-14 LAB — TYPE AND SCREEN
ABO/RH(D): A POS
ANTIBODY SCREEN: NEGATIVE
Unit division: 0
Unit division: 0

## 2016-02-14 MED ORDER — IBUPROFEN 600 MG PO TABS: 600.0000 mg | ORAL_TABLET | Freq: Four times a day (QID) | ORAL | 0 refills | Status: DC | PRN

## 2016-02-14 MED ORDER — CITALOPRAM HYDROBROMIDE 20 MG PO TABS: 20.0000 mg | ORAL_TABLET | Freq: Every day | ORAL | 1 refills | Status: DC

## 2016-02-14 MED ORDER — MAGNESIUM HYDROXIDE 400 MG/5ML PO SUSP
30.0000 mL | Freq: Every day | ORAL | Status: DC
  Filled 2016-02-14: qty 30

## 2016-02-14 MED ORDER — FERROUS SULFATE 325 (65 FE) MG PO TABS: 325.0000 mg | ORAL_TABLET | Freq: Two times a day (BID) | ORAL | 3 refills | Status: DC

## 2016-02-14 MED ORDER — SENNOSIDES-DOCUSATE SODIUM 8.6-50 MG PO TABS: 2.0000 | ORAL_TABLET | Freq: Every day | ORAL | 3 refills | Status: DC

## 2016-02-14 MED ORDER — PRENATAL MULTIVITAMIN CH: 1.0000 | ORAL_TABLET | Freq: Every day | ORAL | 11 refills | Status: DC

## 2016-02-14 MED ORDER — OXYCODONE-ACETAMINOPHEN 5-325 MG PO TABS: 1.0000 | ORAL_TABLET | Freq: Four times a day (QID) | ORAL | 0 refills | Status: DC | PRN

## 2016-02-14 NOTE — Clinical Social Work Note (Signed)
CSW has been contacted by Charisse Klinefelter, patient's community advocate, [phone]. Charisse Klinefelter has stated she found patient a drug rehab bed at [facility] near [place] has stated that they could accept patient next Wednesday. Bria spoke with patient this afternoon and gave patient the number to [facility] if she wished to pursue further and told her to contact the hospital csw if she wanted assisted with getting her records. Bria stated that she put the ball in patient's court to pursue treatment if she wished to. Patient is discharging home today and refused to return to seek treatment in the [facility] unit according to staff documentation. York Spaniel MSW,LCSW [phone]

## 2016-02-14 NOTE — Progress Notes (Signed)
Discharge instructions provided.  Pt and sig other verbalize understanding of all instructions and follow-up care.  Prescriptions given.  Pt discharged to home at 1610 on 02/14/16 via wheelchair by volunteer. Reynold Bowen, RN 02/14/2016 5:07 PM

## 2016-02-14 NOTE — Discharge Summary (Signed)
Obstetric Discharge Summary Reason for Admission: readmission d/t severe post-op anemia Delivery Type: primary cesarean section, low transverse incision, FITL Postpartum Procedures: transfusion 2 units PRBC after readmission Complications-Intrapartum or Postpartum: Pt initially discharged on POD #3 to behavioral health for depression and opiate withdrawal. Admission H&H 4.3 & 12.8. Pt was then re-admitted to mother/baby and received 2 units of PRBC. Pt's H&H are now stable from yesterday at 1100 to today at 0900 at 7.5 & 21.9. Pt states she is ready for discharge home today on POD #5. Declines re-admission to behavioral health today. Does not meet criteria for commitment. Pt will be staying with her mom and states she feels completely safe there.     Recent Labs  02/13/16 1115 02/14/16 0902  HGB 7.5* 7.5*  HCT 21.9* 21.8*      Gestational Age at Delivery: Unknown  Antepartum complications: No prenatal care, drug abuse (marijuana & cocaine per UDS, report of heroine use) Date of Delivery: 02/09/16  Delivered By: Dr Tiburcio Pea   Physical Exam:  General: cooperative, avoids eye contact Lochia: appropriate Uterine Fundus: firm Incision: healing well DVT Evaluation: No evidence of DVT seen on physical exam. Abdomen: moderately distended/tympanic  Prenatal Labs Blood Type: A+ Rubella: Immune Varicella: Immune Contraception: Desires Nexplanon  Discharge Diagnoses: Severe postpartum acute blood loss and iron-deficiency anemia  Discharge Information: Date: 02/14/2016 Activity: pelvic rest Diet: routine Medications: PNV, Ibuprofen, Iron, Percocet and senna Condition: stable Instructions:  Discharge instructions:   Call office if you have any of the following: headache, visual changes, fever >100 F, chills, breast concerns, excessive vaginal bleeding, incision drainage or problems, leg pain or redness, depression or any other concerns.   Activity: Do not lift > 10 lbs for 6 weeks.   No intercourse or tampons for 6 weeks.  No driving for 1-2 weeks.   Discharge to: home Follow-up Information    Letitia Libra, MD. Go on 02/19/2016.   Specialty:  Obstetrics and Gynecology Why:  02/19/16 at 1:15 pm for incision check  Contact information: [address] [place] [address] [phone]            Marta Antu, CNM 02/14/2016, 10:57 AM

## 2016-02-14 NOTE — Discharge Instructions (Signed)
Discharge instructions:   Call office if you have any of the following: headache, visual changes, fever >100 F, chills, breast concerns, excessive vaginal bleeding, incision drainage or problems, leg pain or redness, depression or any other concerns.   Activity: Do not lift > 10 lbs for 6 weeks.  No intercourse or tampons for 6 weeks.  No driving for 1-2 weeks.   Call your doctor for increased pain or vaginal bleeding, temperature above 100.4, depression, or concerns.  No strenuous activity or heavy lifting for 6 weeks.  No intercourse, tampons, douching, or enemas for 6 weeks.  No tub baths-showers only.  No driving for 2 weeks or while taking pain medications.  Continue prenatal vitamin and iron.    Call [facility] as soon as possible to find out when walk-in clinics are available to see a psychiatrist.  The phone number for [facility] is [phone].  The crisis line is [phone].

## 2016-02-19 ENCOUNTER — Emergency Department: Payer: Medicaid Other

## 2016-02-19 ENCOUNTER — Emergency Department
Admission: EM | Admit: 2016-02-19 | Discharge: 2016-02-19 | Disposition: A | Discharge status: Home / Self Care | Payer: Medicaid Other | Attending: Emergency Medicine | Admitting: Emergency Medicine

## 2016-02-19 DIAGNOSIS — F1420 Cocaine dependence, uncomplicated: Secondary | ICD-10-CM | POA: Diagnosis not present

## 2016-02-19 DIAGNOSIS — Z79899 Other long term (current) drug therapy: Secondary | ICD-10-CM | POA: Diagnosis not present

## 2016-02-19 DIAGNOSIS — R519 Headache, unspecified: Secondary | ICD-10-CM

## 2016-02-19 DIAGNOSIS — R51 Headache: Secondary | ICD-10-CM | POA: Diagnosis present

## 2016-02-19 DIAGNOSIS — R079 Chest pain, unspecified: Secondary | ICD-10-CM | POA: Diagnosis not present

## 2016-02-19 DIAGNOSIS — F17210 Nicotine dependence, cigarettes, uncomplicated: Secondary | ICD-10-CM | POA: Diagnosis not present

## 2016-02-19 DIAGNOSIS — R109 Unspecified abdominal pain: Secondary | ICD-10-CM | POA: Insufficient documentation

## 2016-02-19 DIAGNOSIS — F1910 Other psychoactive substance abuse, uncomplicated: Secondary | ICD-10-CM | POA: Insufficient documentation

## 2016-02-19 DIAGNOSIS — R0600 Dyspnea, unspecified: Secondary | ICD-10-CM | POA: Diagnosis not present

## 2016-02-19 DIAGNOSIS — J029 Acute pharyngitis, unspecified: Secondary | ICD-10-CM | POA: Diagnosis not present

## 2016-02-19 DIAGNOSIS — F1120 Opioid dependence, uncomplicated: Secondary | ICD-10-CM | POA: Insufficient documentation

## 2016-02-19 DIAGNOSIS — Z791 Long term (current) use of non-steroidal anti-inflammatories (NSAID): Secondary | ICD-10-CM | POA: Insufficient documentation

## 2016-02-19 DIAGNOSIS — R531 Weakness: Secondary | ICD-10-CM | POA: Diagnosis not present

## 2016-02-19 DIAGNOSIS — F329 Major depressive disorder, single episode, unspecified: Secondary | ICD-10-CM | POA: Insufficient documentation

## 2016-02-19 DIAGNOSIS — F1290 Cannabis use, unspecified, uncomplicated: Secondary | ICD-10-CM | POA: Diagnosis not present

## 2016-02-19 DIAGNOSIS — N939 Abnormal uterine and vaginal bleeding, unspecified: Secondary | ICD-10-CM | POA: Insufficient documentation

## 2016-02-19 DIAGNOSIS — M549 Dorsalgia, unspecified: Secondary | ICD-10-CM | POA: Insufficient documentation

## 2016-02-19 LAB — COMPREHENSIVE METABOLIC PANEL
ALK PHOS: 96 U/L (ref 38–126)
ALT: 20 U/L (ref 14–54)
AST: 25 U/L (ref 15–41)
Albumin: 2.5 g/dL — ABNORMAL LOW (ref 3.5–5.0)
Anion gap: 6 (ref 5–15)
BILIRUBIN TOTAL: 0.5 mg/dL (ref 0.3–1.2)
BUN: 10 mg/dL (ref 6–20)
CALCIUM: 8.5 mg/dL — AB (ref 8.9–10.3)
CO2: 26 mmol/L (ref 22–32)
CREATININE: 0.32 mg/dL — AB (ref 0.44–1.00)
Chloride: 103 mmol/L (ref 101–111)
Glucose, Bld: 96 mg/dL (ref 65–99)
Potassium: 3.6 mmol/L (ref 3.5–5.1)
Sodium: 135 mmol/L (ref 135–145)
TOTAL PROTEIN: 6.7 g/dL (ref 6.5–8.1)

## 2016-02-19 LAB — CBC WITH DIFFERENTIAL/PLATELET
Basophils Absolute: 0.1 10*3/uL (ref 0–0.1)
Basophils Relative: 0 %
Eosinophils Absolute: 0 10*3/uL (ref 0–0.7)
Eosinophils Relative: 0 %
HCT: 28.5 % — ABNORMAL LOW (ref 35.0–47.0)
HEMOGLOBIN: 9.6 g/dL — AB (ref 12.0–16.0)
LYMPHS ABS: 2.1 10*3/uL (ref 1.0–3.6)
LYMPHS PCT: 16 %
MCH: 28.7 pg (ref 26.0–34.0)
MCHC: 33.8 g/dL (ref 32.0–36.0)
MCV: 84.7 fL (ref 80.0–100.0)
MONOS PCT: 9 %
Monocytes Absolute: 1.2 10*3/uL — ABNORMAL HIGH (ref 0.2–0.9)
NEUTROS PCT: 75 %
Neutro Abs: 9.8 10*3/uL — ABNORMAL HIGH (ref 1.4–6.5)
Platelets: 544 10*3/uL — ABNORMAL HIGH (ref 150–440)
RBC: 3.37 MIL/uL — AB (ref 3.80–5.20)
RDW: 17.2 % — ABNORMAL HIGH (ref 11.5–14.5)
WBC: 13.2 10*3/uL — AB (ref 3.6–11.0)

## 2016-02-19 LAB — ETHANOL

## 2016-02-19 MED ORDER — HYDROMORPHONE HCL 1 MG/ML IJ SOLN
1.0000 mg | Freq: Once | INTRAMUSCULAR | Status: AC
  Administered 2016-02-19: 1 mg via INTRAVENOUS
  Filled 2016-02-19: qty 1

## 2016-02-19 MED ORDER — DIPHENHYDRAMINE HCL 50 MG/ML IJ SOLN
25.0000 mg | Freq: Once | INTRAMUSCULAR | Status: AC
  Administered 2016-02-19: 25 mg via INTRAVENOUS
  Filled 2016-02-19: qty 1

## 2016-02-19 MED ORDER — SODIUM CHLORIDE 0.9 % IV SOLN
Freq: Once | INTRAVENOUS | Status: AC
  Administered 2016-02-19: 10:00:00 via INTRAVENOUS
  Filled 2016-02-19: qty 1000

## 2016-02-19 MED ORDER — KETOROLAC TROMETHAMINE 30 MG/ML IJ SOLN
30.0000 mg | Freq: Once | INTRAMUSCULAR | Status: AC
Start: 2016-02-19 — End: 2016-02-19
  Administered 2016-02-19: 30 mg via INTRAVENOUS
  Filled 2016-02-19: qty 1

## 2016-02-19 MED ORDER — METOCLOPRAMIDE HCL 5 MG/ML IJ SOLN
10.0000 mg | Freq: Once | INTRAMUSCULAR | Status: AC
  Administered 2016-02-19: 10 mg via INTRAVENOUS
  Filled 2016-02-19: qty 2

## 2016-02-19 NOTE — ED Notes (Signed)
No esign available at this time

## 2016-02-19 NOTE — ED Notes (Signed)
Received a call from [facility] - pt will not qualify for [facility] as [facility] had originally agreed due to pt being only 10 days postpartum   MD and pt informed  MD requests that the pt receive information on more treatment facilities

## 2016-02-19 NOTE — ED Notes (Signed)
Pt to go to [facility] - [facility] staff will be here at approx 1600 to pick her up

## 2016-02-19 NOTE — Progress Notes (Signed)
TTS has contacted [facility] and spoke with the representative [facility]([place] @ [phone]). Pt meets criteria for the services provided (Detox and Recovery), it has been recommend that the pt contact the facility for an assessment proceeding discharge.   TTS has also verified that the pt meets criteria for [facility]. Pt must be willing to have a drug screening prior to being accepted to this program. Referral information will be provided to the pt at the time of discharge.    02/19/2016 Cheryl FlashNicole Carlyne Keehan, MS, NCC, LPCA Therapeutic Triage Specialist

## 2016-02-19 NOTE — ED Notes (Signed)
Drink and snack provided to the pt

## 2016-02-19 NOTE — ED Notes (Signed)
Pt to CT scan.

## 2016-02-19 NOTE — Progress Notes (Addendum)
TTS has assessed pt as requested by ER MD, Dr. Mayford KnifeWilliams. Pt states that she is seeking detox. Pt is agreeable to being transferred to [facility] for detox and recovery. Pt was informed that writer would seek bed placement although availability is not guaranteed. At that time pt stated " I think I need to go to behavioral health." Pt states that she is still depressed and that her medications are not working. Pt. denies any suicidal ideation, plan or intent. Pt. denies the presence of any auditory or visual hallucinations at this time. Patient denies any other medical complaints.   TTS has faxed pts face sheet and labs to [facility], awaiting a return call.    Substance- Heroin  Age of first use- 25 y.o.  Frequency- Daily  Avg. Amount Abused- 30 $ worth   Last Use & Amount- 02/17/16, 40$ worth  Withdrawal Symptoms- Body aches, irritability     Substance- Cocaine  Age of first use- 25 y.o.  Frequency- 3/4 times a week  Avg. Amount Abused- $100 worth  Last Use & Amount- 02/17/16, 50$ worth  Withdrawal Symptoms-Body aches, irritability   02/19/2016 Cheryl FlashNicole Meyli Boice, MS, NCC, LPCA Therapeutic Triage Specialist

## 2016-02-19 NOTE — Progress Notes (Signed)
[facility] reports that they have accepted the patient. ETA 4pm.  02/19/2016 Tara FlashNicole Tauheedah Bok, MS, NCC, LPCA Therapeutic Triage Specialist

## 2016-02-19 NOTE — ED Notes (Signed)
Received word that the CSW has come to see the pt - I have not received a call from them so pt will continue to transfer to [facility] at approx 1600

## 2016-02-19 NOTE — ED Notes (Signed)
Received information from [facility]  I reviewed this information with the pt along with her discharge instrucitons

## 2016-02-19 NOTE — ED Triage Notes (Signed)
Pt arrives here from home with reports of headache pain for the last 10 days  "I have been hurting in my head ever since my C section"  Pt holding her left eye and face during assessment

## 2016-02-19 NOTE — ED Provider Notes (Signed)
[facility] Emergency Department Provider Note        Time seen: ----------------------------------------- 9:47 AM on 02/19/2016 -----------------------------------------    I have reviewed the triage vital signs and the nursing notes.   HISTORY  Chief Complaint Migraine    HPI Tara Raymond is a 25 y.o. female who presents to ER for multiple complaints. Patient arrives here from home reports of headache for the last 10 days. Patient states been hurting in her head ever since her C-section. Patient presents holding the left side of her face and head during assessment. Patient states pain is particularly left side of her head and face, going down into her neck and her chest. She denies history of these before. She does have a history of drug abuse, is not sure if she is in withdrawal or not. She's not been taking her medications including OxyContin for the past several days because she's not been eating and she was worried this may cause adverse effects. She is complaining of abdominal pain and vaginal bleeding that is persistent.   Past Medical History:  Diagnosis Date  . Mental disorder     Patient Active Problem List   Diagnosis Date Noted  . Anemia associated with acute blood loss 02/13/2016  . Postpartum care following cesarean delivery 02/12/2016  . Major depression, single episode 02/12/2016  . Postoperative anemia due to acute blood loss 02/12/2016  . Adjustment disorder with depressed mood 02/11/2016  . Substance abuse affecting pregnancy in third trimester, antepartum 02/09/2016  . Labor and delivery, indication for care 02/03/2016  . Substance induced mood disorder (HCC) 01/28/2016  . Severe major depression, single episode, without psychotic features (HCC) 01/28/2016  . Cocaine use disorder, moderate, dependence (HCC) 01/28/2016  . Opioid use disorder, moderate, dependence (HCC) 01/28/2016  . Opiate withdrawal (HCC) 01/28/2016    Past  Surgical History:  Procedure Laterality Date  . CESAREAN SECTION N/A 02/09/2016   Procedure: CESAREAN SECTION;  Surgeon: Nadara Mustardobert P Harris, MD;  Location: [facility] ORS;  Service: Obstetrics;  Laterality: N/A;    Allergies Review of patient's allergies indicates no known allergies.  Social History Social History  Substance Use Topics  . Smoking status: Current Every Day Smoker    Packs/day: 0.25    Types: Cigarettes  . Smokeless tobacco: Current User  . Alcohol use No    Review of Systems Constitutional: Negative for fever. Eyes: Positive for vision changes ENT: Positive for sore throat Cardiovascular: Positive for chest pain Respiratory: Positive for difficulty breathing Gastrointestinal: Positive for abdominal pain Genitourinary: Negative for dysuria. positive for vaginal bleeding Musculoskeletal: Positive for back pain Skin: Negative for rash. Neurological: Positive for headache, weakness  10-point ROS otherwise negative.  ____________________________________________   PHYSICAL EXAM:  VITAL SIGNS: ED Triage Vitals  Enc Vitals Group     BP 02/19/16 0941 121/86     Pulse Rate 02/19/16 0941 (!) 108     Resp 02/19/16 0941 17     Temp 02/19/16 0941 98.3 F (36.8 C)     Temp Source 02/19/16 0941 Oral     SpO2 02/19/16 0941 99 %     Weight 02/19/16 0941 123 lb 12.8 oz (56.2 kg)     Height 02/19/16 0941 4\' 9"  (1.448 m)     Head Circumference --      Peak Flow --      Pain Score 02/19/16 0942 10     Pain Loc --      Pain Edu? --  Excl. in GC? --     Constitutional: Alert and oriented. Mild distress Eyes: Conjunctivae are normal. PERRL. Normal extraocular movements. There appears to be photophobia, worse in the left eye with some tearing of the left eye. ENT   Head: Normocephalic and atraumatic.   Nose: No congestion/rhinnorhea.   Mouth/Throat: Mucous membranes are moist.   Neck: No stridor. Cardiovascular: Normal rate, regular rhythm. No murmurs,  rubs, or gallops. Respiratory: Normal respiratory effort without tachypnea nor retractions. Breath sounds are clear and equal bilaterally. No wheezes/rales/rhonchi. Gastrointestinal: Normal bowel sounds, fundus still palpable, tender. C-section incision and staples appears unremarkable. Musculoskeletal: Nontender with normal range of motion in all extremities. No lower extremity tenderness nor edema. Neurologic:  Normal speech and language. No gross focal neurologic deficits are appreciated. Cranial nerves appear to be intact Skin:  Skin is warm, dry and intact. No rash noted. Psychiatric: Depressed mood and affect. ____________________________________________  ED COURSE:  Pertinent labs & imaging results that were available during my care of the patient were reviewed by me and considered in my medical decision making (see chart for details). Clinical Course  Comment By Time  Patient is requesting detox for polysubstance Currently her symptoms have resolved. Emily Filbert, MD 08/09 1150  No specific etiology to her multiple symptoms. We will check basic labs, give IV headache cocktail and reevaluate.  Procedures ____________________________________________   LABS (pertinent positives/negatives)  Labs Reviewed  CBC WITH DIFFERENTIAL/PLATELET - Abnormal; Notable for the following:       Result Value   WBC 13.2 (*)    RBC 3.37 (*)    Hemoglobin 9.6 (*)    HCT 28.5 (*)    RDW 17.2 (*)    Platelets 544 (*)    Neutro Abs 9.8 (*)    Monocytes Absolute 1.2 (*)    All other components within normal limits  COMPREHENSIVE METABOLIC PANEL - Abnormal; Notable for the following:    Creatinine, Ser 0.32 (*)    Calcium 8.5 (*)    Albumin 2.5 (*)    All other components within normal limits  ETHANOL  URINALYSIS COMPLETEWITH MICROSCOPIC ([facility] ONLY)  URINE DRUG SCREEN, QUALITATIVE ([facility] ONLY)    RADIOLOGY  CT head, chest x-ray Are  unremarkable ____________________________________________  FINAL ASSESSMENT AND PLAN  Headache, weakness, polysubstance abuse, depression  Plan: Patient with labs and imaging as dictated above. Patient's physical symptoms appear to have improved. She is now requesting detox for polysubstance abuse, also scrubbing to depression with possible suicidal thought. We will discuss with the psychiatrist. She remains voluntary.   Emily Filbert, MD   Note: This dictation was prepared with Dragon dictation. Any transcriptional errors that result from this process are unintentional    Emily Filbert, MD 02/19/16 1256

## 2016-03-17 ENCOUNTER — Emergency Department
Admission: EM | Admit: 2016-03-17 | Discharge: 2016-03-17 | Disposition: A | Discharge status: Home / Self Care | Payer: Medicaid Other | Attending: Emergency Medicine | Admitting: Emergency Medicine

## 2016-03-17 DIAGNOSIS — Z5181 Encounter for therapeutic drug level monitoring: Secondary | ICD-10-CM | POA: Insufficient documentation

## 2016-03-17 DIAGNOSIS — W1839XA Other fall on same level, initial encounter: Secondary | ICD-10-CM | POA: Insufficient documentation

## 2016-03-17 DIAGNOSIS — Y999 Unspecified external cause status: Secondary | ICD-10-CM | POA: Diagnosis not present

## 2016-03-17 DIAGNOSIS — Y929 Unspecified place or not applicable: Secondary | ICD-10-CM | POA: Diagnosis not present

## 2016-03-17 DIAGNOSIS — S8992XA Unspecified injury of left lower leg, initial encounter: Secondary | ICD-10-CM | POA: Diagnosis present

## 2016-03-17 DIAGNOSIS — Y939 Activity, unspecified: Secondary | ICD-10-CM | POA: Insufficient documentation

## 2016-03-17 DIAGNOSIS — S80212A Abrasion, left knee, initial encounter: Secondary | ICD-10-CM | POA: Insufficient documentation

## 2016-03-17 DIAGNOSIS — F17210 Nicotine dependence, cigarettes, uncomplicated: Secondary | ICD-10-CM | POA: Diagnosis not present

## 2016-03-17 DIAGNOSIS — S60511A Abrasion of right hand, initial encounter: Secondary | ICD-10-CM | POA: Diagnosis not present

## 2016-03-17 DIAGNOSIS — T07XXXA Unspecified multiple injuries, initial encounter: Secondary | ICD-10-CM

## 2016-03-17 DIAGNOSIS — S20111A Abrasion of breast, right breast, initial encounter: Secondary | ICD-10-CM | POA: Diagnosis not present

## 2016-03-17 LAB — URINALYSIS COMPLETE WITH MICROSCOPIC (ARMC ONLY)
GLUCOSE, UA: NEGATIVE mg/dL
LEUKOCYTES UA: NEGATIVE
Nitrite: NEGATIVE
PROTEIN: 100 mg/dL — AB
SPECIFIC GRAVITY, URINE: 1.041 — AB (ref 1.005–1.030)
pH: 5 (ref 5.0–8.0)

## 2016-03-17 LAB — URINE DRUG SCREEN, QUALITATIVE (ARMC ONLY)
AMPHETAMINES, UR SCREEN: NOT DETECTED
BARBITURATES, UR SCREEN: NOT DETECTED
BENZODIAZEPINE, UR SCRN: NOT DETECTED
Cannabinoid 50 Ng, Ur ~~LOC~~: POSITIVE — AB
Cocaine Metabolite,Ur ~~LOC~~: POSITIVE — AB
MDMA (ECSTASY) UR SCREEN: NOT DETECTED
METHADONE SCREEN, URINE: NOT DETECTED
OPIATE, UR SCREEN: POSITIVE — AB
PHENCYCLIDINE (PCP) UR S: NOT DETECTED
TRICYCLIC, UR SCREEN: NOT DETECTED

## 2016-03-17 MED ORDER — DIAZEPAM 2 MG PO TABS
2.0000 mg | ORAL_TABLET | Freq: Once | ORAL | Status: AC
  Administered 2016-03-17: 2 mg via ORAL

## 2016-03-17 MED ORDER — KETOROLAC TROMETHAMINE 60 MG/2ML IM SOLN
30.0000 mg | Freq: Once | INTRAMUSCULAR | Status: AC
  Administered 2016-03-17: 30 mg via INTRAMUSCULAR
  Filled 2016-03-17: qty 2

## 2016-03-17 MED ORDER — KETOROLAC TROMETHAMINE 10 MG PO TABS: 10.0000 mg | ORAL_TABLET | Freq: Three times a day (TID) | ORAL | 0 refills | Status: DC

## 2016-03-17 MED ORDER — CEPHALEXIN 500 MG PO CAPS: 500.0000 mg | ORAL_CAPSULE | Freq: Three times a day (TID) | ORAL | 0 refills | Status: DC

## 2016-03-17 MED ORDER — SILVER SULFADIAZINE 1 % EX CREA
TOPICAL_CREAM | Freq: Once | CUTANEOUS | Status: AC
  Administered 2016-03-17: 1 via TOPICAL
  Filled 2016-03-17: qty 85

## 2016-03-17 MED ORDER — SILVER SULFADIAZINE 1 % EX CREA: TOPICAL_CREAM | CUTANEOUS | 1 refills | Status: AC

## 2016-03-17 MED ORDER — MUPIROCIN CALCIUM 2 % EX CREA
TOPICAL_CREAM | Freq: Once | CUTANEOUS | Status: AC
  Administered 2016-03-17: 19:00:00 via TOPICAL
  Filled 2016-03-17: qty 15

## 2016-03-17 MED ORDER — ACETAMINOPHEN 325 MG PO TABS
ORAL_TABLET | ORAL | Status: AC
  Administered 2016-03-17: 650 mg via ORAL
  Filled 2016-03-17: qty 2

## 2016-03-17 MED ORDER — DIAZEPAM 2 MG PO TABS
ORAL_TABLET | ORAL | Status: AC
  Administered 2016-03-17: 2 mg via ORAL
  Filled 2016-03-17: qty 1

## 2016-03-17 MED ORDER — ACETAMINOPHEN 325 MG PO TABS
650.0000 mg | ORAL_TABLET | Freq: Once | ORAL | Status: AC
  Administered 2016-03-17: 650 mg via ORAL

## 2016-03-17 NOTE — ED Notes (Signed)
Attempted to take pt BP X 6 times. Pt will not sit still long enough for BP machine to read.
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
## 2016-03-17 NOTE — ED Notes (Signed)
Dressing to right lower leg and right upper leg. Right breast and right shoulder, right hand, left hand covered with cream.

## 2016-03-17 NOTE — ED Notes (Signed)
[place] [employer] at bedside 
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
## 2016-04-20 NOTE — Addendum Note (Signed)
Addendum  created 04/20/16 [id number] by Yevette EdwardsJames G Adams, MD   Anesthesia Staff edited

## 2016-08-15 ENCOUNTER — Encounter: Payer: Self-pay | Admitting: Emergency Medicine

## 2016-08-15 ENCOUNTER — Emergency Department
Admission: EM | Admit: 2016-08-15 | Discharge: 2016-08-15 | Disposition: A | Discharge status: Home / Self Care | Payer: Medicaid Other | Attending: Emergency Medicine | Admitting: Emergency Medicine

## 2016-08-15 DIAGNOSIS — Z79899 Other long term (current) drug therapy: Secondary | ICD-10-CM | POA: Diagnosis not present

## 2016-08-15 DIAGNOSIS — Z4802 Encounter for removal of sutures: Secondary | ICD-10-CM | POA: Diagnosis present

## 2016-08-15 DIAGNOSIS — F17210 Nicotine dependence, cigarettes, uncomplicated: Secondary | ICD-10-CM | POA: Insufficient documentation

## 2016-08-15 DIAGNOSIS — L089 Local infection of the skin and subcutaneous tissue, unspecified: Secondary | ICD-10-CM

## 2016-08-15 MED ORDER — LIDOCAINE-EPINEPHRINE (PF) 1 %-1:200000 IJ SOLN: 10.0000 mL | Freq: Once | INTRAMUSCULAR | Status: DC

## 2016-08-15 MED ORDER — SULFAMETHOXAZOLE-TRIMETHOPRIM 800-160 MG PO TABS: 1.0000 | ORAL_TABLET | Freq: Two times a day (BID) | ORAL | 0 refills | Status: DC

## 2016-08-15 MED ORDER — LIDOCAINE-EPINEPHRINE (PF) 2 %-1:200000 IJ SOLN
10.0000 mL | Freq: Once | INTRAMUSCULAR | Status: AC
  Administered 2016-08-15: 10 mL
  Filled 2016-08-15: qty 10

## 2016-08-15 NOTE — ED Provider Notes (Signed)
[facility] Emergency Department Provider Note  ____________________________________________  Time seen: Approximately 12:19 PM  I have reviewed the triage vital signs and the nursing notes.   HISTORY  Chief Complaint Suture / Staple Removal    HPI Tara Raymond is a 26 y.o. female , NAD, presents to the emergency department for staple removal. Patient states she had a cesarean section in July 2017 in which staples were placed in her abdomen. States that she did not return for staple removal due to "personal circumstances". States that she presents today to have his staples removed as the area has been painful and she has noted some oozing and weeping and some of the sites. Denies any redness, swelling or warmth to the area. Has had no numbness or tingling about the abdomen. Denies any abdominal pain, nausea, vomiting, changes in urinary or bowel habits. Denies fevers, chills or body aches. Has had no chest pain or shortness of breath.   Past Medical History:  Diagnosis Date  . Mental disorder     Patient Active Problem List   Diagnosis Date Noted  . Anemia associated with acute blood loss 02/13/2016  . Postpartum care following cesarean delivery 02/12/2016  . Major depression, single episode 02/12/2016  . Postoperative anemia due to acute blood loss 02/12/2016  . Adjustment disorder with depressed mood 02/11/2016  . Substance abuse affecting pregnancy in third trimester, antepartum 02/09/2016  . Labor and delivery, indication for care 02/03/2016  . Substance induced mood disorder (HCC) 01/28/2016  . Severe major depression, single episode, without psychotic features (HCC) 01/28/2016  . Cocaine use disorder, moderate, dependence (HCC) 01/28/2016  . Opioid use disorder, moderate, dependence (HCC) 01/28/2016  . Opiate withdrawal (HCC) 01/28/2016    Past Surgical History:  Procedure Laterality Date  . CESAREAN SECTION N/A 02/09/2016   Procedure:  CESAREAN SECTION;  Surgeon: Nadara Mustardobert P Harris, MD;  Location: [facility] ORS;  Service: Obstetrics;  Laterality: N/A;    Prior to Admission medications   Medication Sig Start Date End Date Taking? Authorizing Provider  cephALEXin (KEFLEX) 500 MG capsule Take 1 capsule (500 mg total) by mouth 3 (three) times daily. 03/17/16   Jenise V Bacon Menshew, PA-C  citalopram (CELEXA) 20 MG tablet Take 1 tablet (20 mg total) by mouth daily. 02/14/16   Audery AmelJohn T Clapacs, MD  ferrous sulfate 325 (65 FE) MG tablet Take 1 tablet (325 mg total) by mouth 2 (two) times daily with a meal. 02/14/16   Marta Antuamara Brothers, CNM  ibuprofen (ADVIL,MOTRIN) 600 MG tablet Take 1 tablet (600 mg total) by mouth every 6 (six) hours as needed for fever or headache. 02/14/16   Marta Antuamara Brothers, CNM  ketorolac (TORADOL) 10 MG tablet Take 1 tablet (10 mg total) by mouth every 8 (eight) hours. 03/17/16   Jenise V Bacon Menshew, PA-C  oxyCODONE-acetaminophen (PERCOCET/ROXICET) 5-325 MG tablet Take 1-2 tablets by mouth every 6 (six) hours as needed for moderate pain or severe pain. 02/14/16   Marta Antuamara Brothers, CNM  Prenatal Vit-Fe Fumarate-FA (PRENATAL MULTIVITAMIN) TABS tablet Take 1 tablet by mouth daily at 12 noon. 02/14/16   Marta Antuamara Brothers, CNM  senna-docusate (SENOKOT-S) 8.6-50 MG tablet Take 2 tablets by mouth at bedtime. 02/14/16   Marta Antuamara Brothers, CNM  silver sulfADIAZINE (SILVADENE) 1 % cream Apply to affected area daily 03/17/16 03/17/17  Jenise V Bacon Menshew, PA-C  sulfamethoxazole-trimethoprim (BACTRIM DS,SEPTRA DS) 800-160 MG tablet Take 1 tablet by mouth 2 (two) times daily. 08/15/16   Sharada Albornoz L Chinmay Squier, PA-C  Allergies Naproxen  No family history on file.  Social History Social History  Substance Use Topics  . Smoking status: Current Every Day Smoker    Packs/day: 0.25    Types: Cigarettes  . Smokeless tobacco: Current User  . Alcohol use No     Review of Systems  Constitutional: No fever/chills, fatigue Cardiovascular: No chest  pain. Respiratory: No shortness of breath.  Gastrointestinal: No abdominal pain.  No nausea, vomiting.  No diarrhea.  No constipation. Genitourinary: Negative for dysuria. No hematuria. No urinary hesitancy, urgency or increased frequency. Musculoskeletal: Negative for general myalgias.  Skin: Positive staples in the lower abdomen with oozing and weeping. Negative for rash, redness, swelling. Neurological: Negative for numbness or tingling. 10-point ROS otherwise negative.  ____________________________________________   PHYSICAL EXAM:  VITAL SIGNS: ED Triage Vitals  Enc Vitals Group     BP 08/15/16 1207 114/73     Pulse Rate 08/15/16 1207 100     Resp 08/15/16 1207 18     Temp 08/15/16 1207 98.2 F (36.8 C)     Temp Source 08/15/16 1207 Oral     SpO2 08/15/16 1207 100 %     Weight 08/15/16 1208 125 lb (56.7 kg)     Height 08/15/16 1208 4\' 9"  (1.448 m)     Head Circumference --      Peak Flow --      Pain Score 08/15/16 1209 8     Pain Loc --      Pain Edu? --      Excl. in GC? --      Constitutional: Alert and oriented. Well appearing and in no acute distress. Eyes: Conjunctivae are normal.  Head: Atraumatic. Hematological/Lymphatic/Immunilogical: No Inguinal lymphadenopathy. Cardiovascular: Good peripheral circulation. Respiratory: Normal respiratory effort without tachypnea or retractions Gastrointestinal: Soft and nontender without distention or guarding in all quadrants. No rebound or rigidity. Neurologic:  Normal speech and language. No gross focal neurologic deficits are appreciated.  Skin:  Postsurgical scar with 9 staples visibly in place about the lower abdomen. 3 of the areas do have yellow crusting and some yellow purulent oozing. Tenderness to palpation about each staple but no abnormal warmth or redness to the area. No streaking. No induration or fluctuance noted. Skin is warm, dry. No rash noted. Psychiatric: Mood and affect are normal. Speech and behavior  are normal. Patient exhibits appropriate insight and judgement.   ____________________________________________   LABS  None ____________________________________________  EKG  None ____________________________________________  RADIOLOGY  None ____________________________________________    PROCEDURES  Procedure(s) performed: None   .Suture Removal Date/Time: 08/15/2016 1:30 PM Performed by: Tye Savoy L Authorized by: Hope Pigeon   Consent:    Consent obtained:  Verbal   Consent given by:  Patient   Risks discussed:  Bleeding and pain   Alternatives discussed:  Referral Location:    Location:  Trunk   Trunk location:  Abdomen Procedure details:    Wound appearance:  Tender and purulent   Number of staples removed:  9 Post-procedure details:    Post-removal:  No dressing applied   Patient tolerance of procedure:  Tolerated well, no immediate complications Comments:     Due to patient's pain level, anaesthesia obtained via infiltration of lidocaine 2% with epi. Staples removed with staple remover. 3 areas with purulent drainage once staples were removed.     Medications  lidocaine-EPINEPHrine (XYLOCAINE W/EPI) 2 %-1:200000 (PF) injection 10 mL (10 mLs Infiltration Given 08/15/16 1344)     ____________________________________________  INITIAL IMPRESSION / ASSESSMENT AND PLAN / ED COURSE  Pertinent labs & imaging results that were available during my care of the patient were reviewed by me and considered in my medical decision making (see chart for details).     Patient's diagnosis is consistent with Skin infection and need for removal of staples. Patient will be discharged home with prescriptions for Bactrim DS to take as directed. Patient is to follow up with her primary care provider or OB/GYN if symptoms persist past this treatment course. Patient is given ED precautions to return to the ED for any worsening or new symptoms.    ____________________________________________  FINAL CLINICAL IMPRESSION(S) / ED DIAGNOSES  Final diagnoses:  Infection of skin  Encounter for staple removal      NEW MEDICATIONS STARTED DURING THIS VISIT:  Discharge Medication List as of 08/15/2016  1:36 PM    START taking these medications   Details  sulfamethoxazole-trimethoprim (BACTRIM DS,SEPTRA DS) 800-160 MG tablet Take 1 tablet by mouth 2 (two) times daily., Starting Sat 08/15/2016, Print             Ernestene Kiel Christie, PA-C 08/15/16 1417    Jennye Moccasin, MD 08/15/16 [phone]

## 2016-08-15 NOTE — ED Triage Notes (Signed)
States had c section February 08, 2017. Did not have staples removed. Do not appear infected.

## 2016-08-24 ENCOUNTER — Encounter: Payer: Self-pay | Admitting: Emergency Medicine

## 2016-08-24 ENCOUNTER — Emergency Department
Admission: EM | Admit: 2016-08-24 | Discharge: 2016-08-24 | Disposition: A | Discharge status: Home / Self Care | Payer: Medicaid Other | Attending: Emergency Medicine | Admitting: Emergency Medicine

## 2016-08-24 DIAGNOSIS — B9789 Other viral agents as the cause of diseases classified elsewhere: Secondary | ICD-10-CM

## 2016-08-24 DIAGNOSIS — J069 Acute upper respiratory infection, unspecified: Secondary | ICD-10-CM | POA: Insufficient documentation

## 2016-08-24 DIAGNOSIS — F17200 Nicotine dependence, unspecified, uncomplicated: Secondary | ICD-10-CM | POA: Insufficient documentation

## 2016-08-24 DIAGNOSIS — R05 Cough: Secondary | ICD-10-CM | POA: Diagnosis present

## 2016-08-24 MED ORDER — GUAIFENESIN-CODEINE 100-10 MG/5ML PO SYRP: 5.0000 mL | ORAL_SOLUTION | Freq: Three times a day (TID) | ORAL | 0 refills | Status: DC | PRN

## 2016-08-24 NOTE — ED Provider Notes (Signed)
[facility] Emergency Department Provider Note  ____________________________________________  Time seen: Approximately 2:09 PM  I have reviewed the triage vital signs and the nursing notes.   HISTORY  Chief Complaint Cough   HPI Tara Raymond is a 26 y.o. female who presents to the emergency department for evaluation of cough x 4 days.Cough is dry and nonproductive. She denies fever, sore throat, nausea, vomiting, or diarrhea. She has not taken any medications to relieve her symptoms.    Past Medical History:  Diagnosis Date  . Mental disorder     Patient Active Problem List   Diagnosis Date Noted  . Anemia associated with acute blood loss 02/13/2016  . Postpartum care following cesarean delivery 02/12/2016  . Major depression, single episode 02/12/2016  . Postoperative anemia due to acute blood loss 02/12/2016  . Adjustment disorder with depressed mood 02/11/2016  . Substance abuse affecting pregnancy in third trimester, antepartum 02/09/2016  . Labor and delivery, indication for care 02/03/2016  . Substance induced mood disorder (HCC) 01/28/2016  . Severe major depression, single episode, without psychotic features (HCC) 01/28/2016  . Cocaine use disorder, moderate, dependence (HCC) 01/28/2016  . Opioid use disorder, moderate, dependence (HCC) 01/28/2016  . Opiate withdrawal (HCC) 01/28/2016    Past Surgical History:  Procedure Laterality Date  . CESAREAN SECTION N/A 02/09/2016   Procedure: CESAREAN SECTION;  Surgeon: Nadara Mustardobert P Harris, MD;  Location: [facility] ORS;  Service: Obstetrics;  Laterality: N/A;    Prior to Admission medications   Medication Sig Start Date End Date Taking? Authorizing Provider  cephALEXin (KEFLEX) 500 MG capsule Take 1 capsule (500 mg total) by mouth 3 (three) times daily. 03/17/16   Jenise V Bacon Menshew, PA-C  citalopram (CELEXA) 20 MG tablet Take 1 tablet (20 mg total) by mouth daily. 02/14/16   Audery AmelJohn T Clapacs, MD   ferrous sulfate 325 (65 FE) MG tablet Take 1 tablet (325 mg total) by mouth 2 (two) times daily with a meal. 02/14/16   Marta Antuamara Brothers, CNM  guaiFENesin-codeine (ROBITUSSIN AC) 100-10 MG/5ML syrup Take 5 mLs by mouth 3 (three) times daily as needed for cough. 08/24/16   Chinita Pesterari B Vasiliki Smaldone, FNP  ibuprofen (ADVIL,MOTRIN) 600 MG tablet Take 1 tablet (600 mg total) by mouth every 6 (six) hours as needed for fever or headache. 02/14/16   Marta Antuamara Brothers, CNM  ketorolac (TORADOL) 10 MG tablet Take 1 tablet (10 mg total) by mouth every 8 (eight) hours. 03/17/16   Jenise V Bacon Menshew, PA-C  oxyCODONE-acetaminophen (PERCOCET/ROXICET) 5-325 MG tablet Take 1-2 tablets by mouth every 6 (six) hours as needed for moderate pain or severe pain. 02/14/16   Marta Antuamara Brothers, CNM  Prenatal Vit-Fe Fumarate-FA (PRENATAL MULTIVITAMIN) TABS tablet Take 1 tablet by mouth daily at 12 noon. 02/14/16   Marta Antuamara Brothers, CNM  senna-docusate (SENOKOT-S) 8.6-50 MG tablet Take 2 tablets by mouth at bedtime. 02/14/16   Marta Antuamara Brothers, CNM  silver sulfADIAZINE (SILVADENE) 1 % cream Apply to affected area daily 03/17/16 03/17/17  Jenise V Bacon Menshew, PA-C  sulfamethoxazole-trimethoprim (BACTRIM DS,SEPTRA DS) 800-160 MG tablet Take 1 tablet by mouth 2 (two) times daily. 08/15/16   Jami L Hagler, PA-C    Allergies Naproxen  No family history on file.  Social History Social History  Substance Use Topics  . Smoking status: Former Smoker    Packs/day: 0.25    Types: Cigarettes  . Smokeless tobacco: Current User  . Alcohol use No    Review of Systems Constitutional: No  fever/chills ENT: No sore throat. Cardiovascular: Denies chest pain. Respiratory: No shortness of breath. Positive for cough. Gastrointestinal: No nausea,  no vomiting.  No diarrhea.  Musculoskeletal: Negative for body aches Skin: Negative for rash. Neurological: Negative for headaches ____________________________________________   PHYSICAL EXAM:  VITAL SIGNS: ED  Triage Vitals  Enc Vitals Group     BP 08/24/16 1259 93/65     Pulse Rate 08/24/16 1259 89     Resp 08/24/16 1259 20     Temp 08/24/16 1259 98.5 F (36.9 C)     Temp Source 08/24/16 1259 Oral     SpO2 08/24/16 1259 99 %     Weight 08/24/16 1300 125 lb (56.7 kg)     Height 08/24/16 1300 4\' 9"  (1.448 m)     Head Circumference --      Peak Flow --      Pain Score 08/24/16 1301 6     Pain Loc --      Pain Edu? --      Excl. in GC? --     Constitutional: Alert and oriented. Acutely ill appearing and in no acute distress. Eyes: Conjunctivae are normal. EOMI. Ears: Bilateral TM normal Nose: Sinus congestion; clear rhinnorhea. Mouth/Throat: Mucous membranes are moist.  Oropharynx non erythematous. Tonsils appear normal. Neck: No stridor.  Lymphatic: No cervical lymphadenopathy. Cardiovascular: Normal rate, regular rhythm. Grossly normal heart sounds.  Good peripheral circulation. Respiratory: Normal respiratory effort.  No retractions. Breath sounds clear to auscultation. Gastrointestinal: Soft and nontender.  Musculoskeletal: FROM x 4 extremities.  Neurologic:  Normal speech and language.  Skin:  Skin is warm, dry and intact. No rash noted. Psychiatric: Mood and affect are normal. Speech and behavior are normal.  ____________________________________________   LABS (all labs ordered are listed, but only abnormal results are displayed)  Labs Reviewed - No data to display ____________________________________________  EKG   ____________________________________________  RADIOLOGY  Not indicated. ____________________________________________   PROCEDURES  Procedure(s) performed: None  Critical Care performed: No  ____________________________________________   INITIAL IMPRESSION / ASSESSMENT AND PLAN / ED COURSE     Pertinent labs & imaging results that were available during my care of the patient were reviewed by me and considered in my medical decision making (see  chart for details).   26 year old female presenting to the emergency department for evaluation of viral URI symptoms. She will be treated with Robitussin AC and advised to follow up with the PCP of her choice or return to the ER for symptoms that change or worsen. ____________________________________________   FINAL CLINICAL IMPRESSION(S) / ED DIAGNOSES  Final diagnoses:  Viral URI with cough    Note:  This document was prepared using Dragon voice recognition software and may include unintentional dictation errors.     Chinita Pester, FNP 08/24/16 1709    Rockne Menghini, MD 09/01/16 [phone]

## 2016-08-24 NOTE — ED Triage Notes (Signed)
Cough, body aches and fever x 4 days.

## 2016-08-24 NOTE — ED Notes (Signed)
See triage note  States she developed flu like sx's with cough last Thursday   Positive body aches   Afebrile on arrival to ed

## 2016-09-10 ENCOUNTER — Encounter: Payer: Self-pay | Admitting: Emergency Medicine

## 2016-09-10 ENCOUNTER — Emergency Department
Admission: EM | Admit: 2016-09-10 | Discharge: 2016-09-10 | Disposition: A | Discharge status: Home / Self Care | Payer: Medicaid Other | Attending: Emergency Medicine | Admitting: Emergency Medicine

## 2016-09-10 DIAGNOSIS — Z3A01 Less than 8 weeks gestation of pregnancy: Secondary | ICD-10-CM | POA: Insufficient documentation

## 2016-09-10 DIAGNOSIS — O99331 Smoking (tobacco) complicating pregnancy, first trimester: Secondary | ICD-10-CM | POA: Insufficient documentation

## 2016-09-10 DIAGNOSIS — R55 Syncope and collapse: Secondary | ICD-10-CM | POA: Diagnosis not present

## 2016-09-10 DIAGNOSIS — F17200 Nicotine dependence, unspecified, uncomplicated: Secondary | ICD-10-CM | POA: Diagnosis not present

## 2016-09-10 DIAGNOSIS — Z791 Long term (current) use of non-steroidal anti-inflammatories (NSAID): Secondary | ICD-10-CM | POA: Insufficient documentation

## 2016-09-10 DIAGNOSIS — O26891 Other specified pregnancy related conditions, first trimester: Secondary | ICD-10-CM | POA: Diagnosis not present

## 2016-09-10 LAB — POCT PREGNANCY, URINE: Preg Test, Ur: POSITIVE — AB

## 2016-09-10 MED ORDER — SODIUM CHLORIDE 0.9 % IV BOLUS (SEPSIS): 1000.0000 mL | Freq: Once | INTRAVENOUS | Status: DC

## 2016-09-10 MED ORDER — PRENATAL VITAMINS 0.8 MG PO TABS: 1.0000 | ORAL_TABLET | Freq: Every day | ORAL | 0 refills | Status: DC

## 2016-09-10 NOTE — Discharge Instructions (Signed)
Please establish care with an OB gynecologist within 1 week. Return to the emergency department for any new or worsening symptoms such as abdominal pain, if you pass out, or for any other concerns.  Results for orders placed or performed during the hospital encounter of 09/10/16  Pregnancy, urine POC  Result Value Ref Range   Preg Test, Ur POSITIVE (A) NEGATIVE

## 2016-09-10 NOTE — ED Triage Notes (Signed)
Pt here from work after near syncopal episode, reports her BP was 88/58 at work. Pt denies any pain, CP, SHOB, or any other symptoms.

## 2016-09-10 NOTE — ED Provider Notes (Signed)
[facility] Emergency Department Provider Note  ____________________________________________   First MD Initiated Contact with Patient 09/10/16 1846     (approximate)  I have reviewed the triage vital signs and the nursing notes.   HISTORY  Chief Complaint Near Syncope    HPI Tara Raymond is a 26 y.o. female who comes to the emergency department after a near syncopal episode at work. She was sitting down and stood up quickly and when she did she began to feel lightheaded and feel like things were tunneling in and somewhat nauseated. She sat down and felt better. She has never passed out before. She denies chest pain or shortness of breath. She denies palpitations. She denies family history of sudden cardiac death. She denies recent illness. Her last menstrual period was about 2 months ago and she does not know whether or not she is pregnant.   Past Medical History:  Diagnosis Date  . Mental disorder     Patient Active Problem List   Diagnosis Date Noted  . Anemia associated with acute blood loss 02/13/2016  . Postpartum care following cesarean delivery 02/12/2016  . Major depression, single episode 02/12/2016  . Postoperative anemia due to acute blood loss 02/12/2016  . Adjustment disorder with depressed mood 02/11/2016  . Substance abuse affecting pregnancy in third trimester, antepartum 02/09/2016  . Labor and delivery, indication for care 02/03/2016  . Substance induced mood disorder (HCC) 01/28/2016  . Severe major depression, single episode, without psychotic features (HCC) 01/28/2016  . Cocaine use disorder, moderate, dependence (HCC) 01/28/2016  . Opioid use disorder, moderate, dependence (HCC) 01/28/2016  . Opiate withdrawal (HCC) 01/28/2016    Past Surgical History:  Procedure Laterality Date  . CESAREAN SECTION N/A 02/09/2016   Procedure: CESAREAN SECTION;  Surgeon: Nadara Mustard, MD;  Location: [facility] ORS;  Service: Obstetrics;   Laterality: N/A;    Prior to Admission medications   Medication Sig Start Date End Date Taking? Authorizing Provider  cephALEXin (KEFLEX) 500 MG capsule Take 1 capsule (500 mg total) by mouth 3 (three) times daily. 03/17/16   Jenise V Bacon Menshew, PA-C  citalopram (CELEXA) 20 MG tablet Take 1 tablet (20 mg total) by mouth daily. 02/14/16   Audery Amel, MD  ferrous sulfate 325 (65 FE) MG tablet Take 1 tablet (325 mg total) by mouth 2 (two) times daily with a meal. 02/14/16   Marta Antu, CNM  guaiFENesin-codeine (ROBITUSSIN AC) 100-10 MG/5ML syrup Take 5 mLs by mouth 3 (three) times daily as needed for cough. 08/24/16   Chinita Pester, FNP  ibuprofen (ADVIL,MOTRIN) 600 MG tablet Take 1 tablet (600 mg total) by mouth every 6 (six) hours as needed for fever or headache. 02/14/16   Marta Antu, CNM  ketorolac (TORADOL) 10 MG tablet Take 1 tablet (10 mg total) by mouth every 8 (eight) hours. 03/17/16   Jenise V Bacon Menshew, PA-C  oxyCODONE-acetaminophen (PERCOCET/ROXICET) 5-325 MG tablet Take 1-2 tablets by mouth every 6 (six) hours as needed for moderate pain or severe pain. 02/14/16   Marta Antu, CNM  Prenatal Multivit-Min-Fe-FA (PRENATAL VITAMINS) 0.8 MG tablet Take 1 tablet by mouth daily. 09/10/16   Merrily Brittle, MD  Prenatal Vit-Fe Fumarate-FA (PRENATAL MULTIVITAMIN) TABS tablet Take 1 tablet by mouth daily at 12 noon. 02/14/16   Marta Antu, CNM  senna-docusate (SENOKOT-S) 8.6-50 MG tablet Take 2 tablets by mouth at bedtime. 02/14/16   Marta Antu, CNM  silver sulfADIAZINE (SILVADENE) 1 % cream Apply to  affected area daily 03/17/16 03/17/17  Jenise V Bacon Menshew, PA-C  sulfamethoxazole-trimethoprim (BACTRIM DS,SEPTRA DS) 800-160 MG tablet Take 1 tablet by mouth 2 (two) times daily. 08/15/16   Jami L Hagler, PA-C    Allergies Naproxen  No family history on file.  Social History Social History  Substance Use Topics  . Smoking status: Former Smoker    Packs/day: 0.25    Types:  Cigarettes  . Smokeless tobacco: Current User  . Alcohol use No    Review of Systems Constitutional: No fever/chills Eyes: No visual changes. ENT: No sore throat. Cardiovascular: Denies chest pain. Respiratory: Denies shortness of breath. Gastrointestinal: No abdominal pain.  No nausea, no vomiting.  No diarrhea.  No constipation. Genitourinary: Negative for dysuria. Musculoskeletal: Negative for back pain. Skin: Negative for rash. Neurological: Negative for headaches, focal weakness or numbness.  10-point ROS otherwise negative.  ____________________________________________   PHYSICAL EXAM:  VITAL SIGNS: ED Triage Vitals  Enc Vitals Group     BP 09/10/16 1843 116/62     Pulse Rate 09/10/16 1842 89     Resp 09/10/16 1842 16     Temp 09/10/16 1842 98.7 F (37.1 C)     Temp Source 09/10/16 1842 Oral     SpO2 09/10/16 1842 99 %     Weight 09/10/16 1842 125 lb (56.7 kg)     Height 09/10/16 1842 4\' 9"  (1.448 m)     Head Circumference --      Peak Flow --      Pain Score 09/10/16 1842 0     Pain Loc --      Pain Edu? --      Excl. in GC? --     Constitutional: Alert and oriented x 4 well appearing nontoxic no diaphoresis speaks in full, clear sentences Eyes: PERRL EOMI. Head: Atraumatic. Nose: No congestion/rhinnorhea. Mouth/Throat: No trismus Neck: No stridor.   Cardiovascular: Normal rate, regular rhythm. Grossly normal heart sounds.  Good peripheral circulation. Respiratory: Normal respiratory effort.  No retractions. Lungs CTAB and moving good air Gastrointestinal: Soft nondistended nontender no rebound no guarding no peritonitis no McBurney's tenderness negative Rovsing's no costovertebral tenderness negative Murphy's Musculoskeletal: No lower extremity edema   Neurologic:  Normal speech and language. No gross focal neurologic deficits are appreciated. Skin:  Skin is warm, dry and intact. No rash noted. Psychiatric: Mood and affect are normal. Speech and  behavior are normal.  ____________________________________________   LABS (all labs ordered are listed, but only abnormal results are displayed)  Labs Reviewed  POCT PREGNANCY, URINE - Abnormal; Notable for the following:       Result Value   Preg Test, Ur POSITIVE (*)    All other components within normal limits  POC URINE PREG, ED   ____________________________________________  EKG  ED ECG REPORT I, Merrily BrittleNeil Cassie Shedlock, the attending physician, personally viewed and interpreted this ECG.  Date: 09/10/2016 EKG Time: 1845 Rate: 91 Rhythm: normal sinus rhythm QRS Axis: normal Intervals: normal ST/T Wave abnormalities: normal Conduction Disturbances: none Narrative Interpretation: unremarkable no Brugada, normal QTC, no blocks, no signs of hypertrophic cardiomyopathy, no arrhythmogenic right ventricular dysplasia  ____________________________________________  RADIOLOGY   ____________________________________________   PROCEDURES  Procedure(s) performed: no  Procedures  Critical Care performed: no  ____________________________________________   INITIAL IMPRESSION / ASSESSMENT AND PLAN / ED COURSE  Pertinent labs & imaging results that were available during my care of the patient were reviewed by me and considered in my medical decision making (see chart  for details).  On arrival the patient is very well-appearing and hemodynamically stable. Her history is most consistent with vasovagal episode. She has no concerning murmurs and her EKG is unremarkable. I will check a pregnancy test and whether it is positive or negative she will be stable for outpatient management.   the patient is pregnant which likely explains her vasovagal episode. I will prescribe her prenatal vitamins and referred her to an OB gynecologist. She is medically stable for outpatient management.   ____________________________________________   FINAL CLINICAL IMPRESSION(S) / ED DIAGNOSES  Final  diagnoses:  Near syncope  Vasovagal attack  Less than [redacted] weeks gestation of pregnancy      NEW MEDICATIONS STARTED DURING THIS VISIT:  New Prescriptions   PRENATAL MULTIVIT-MIN-FE-FA (PRENATAL VITAMINS) 0.8 MG TABLET    Take 1 tablet by mouth daily.     Note:  This document was prepared using Dragon voice recognition software and may include unintentional dictation errors.     Merrily Brittle, MD 09/10/16 [phone]

## 2016-09-10 NOTE — ED Notes (Signed)
Pt presents with near syncope when she was at work today. She states that she stood up and was dizzy. She reports that she felt better when she sat down. Pt alert & oriented.

## 2016-09-10 NOTE — ED Notes (Signed)
Pt refusing to have blood drawn for lab work and to have a PIV inserted for IV fluids. Dr Lamont Snowballifenbark made aware; VO for lab work and IV fluids to be d/c'd at this time.

## 2016-09-14 IMAGING — US US OB LIMITED
1 series · 14 of 28 positions shown · non-contrast
Comparison: none

CLINICAL DATA: Pregnant patient.  Substance abuse.

EXAM:
LIMITED OBSTETRIC ULTRASOUND

[Series 1: us ob limited · 0.25mm/px · 14 of 29 slices shown]
[im 2/29]
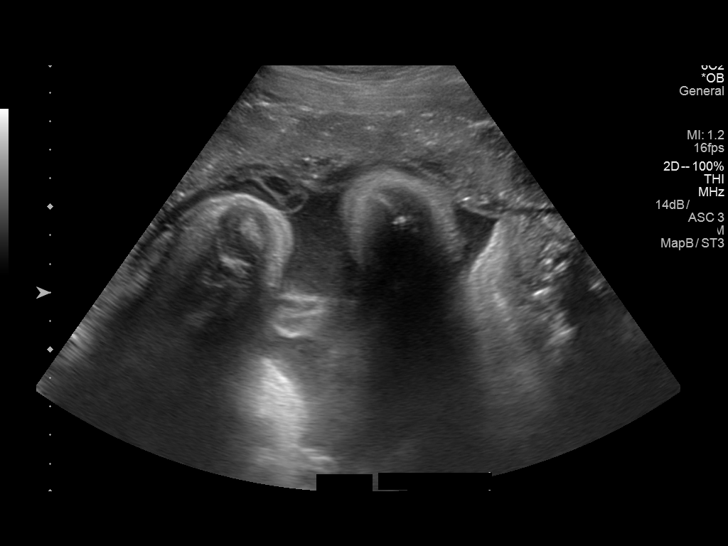
[im 4/29]
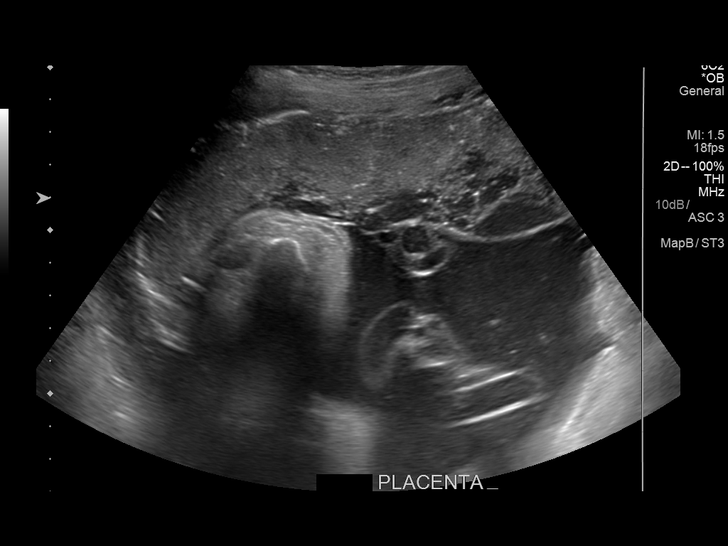
[im 6/29]
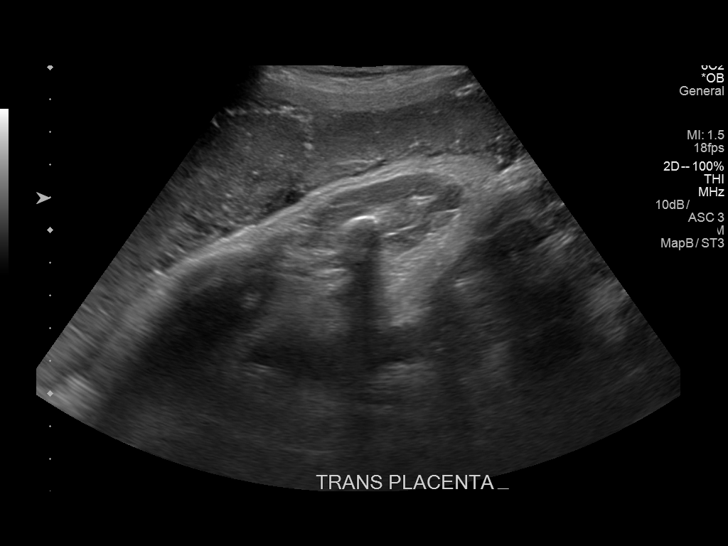
[im 8/29]
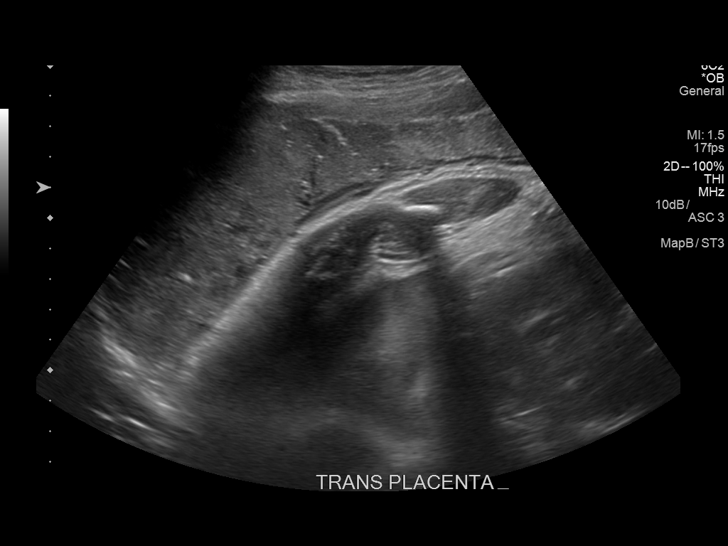
[im 10/29]
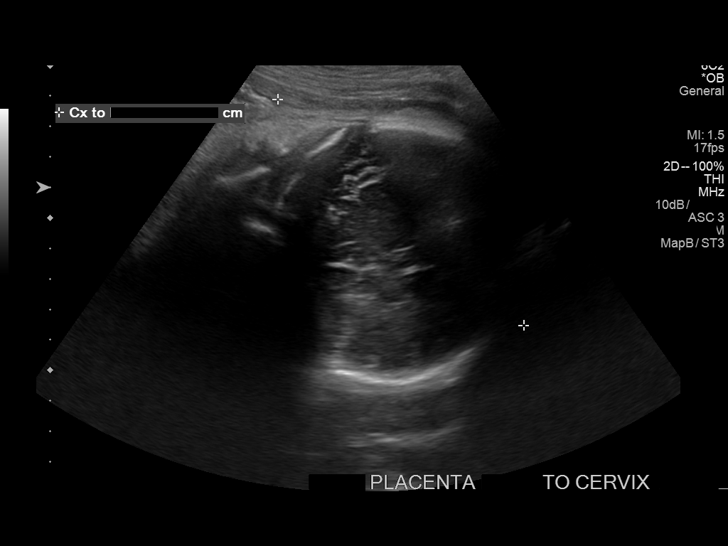
[im 12/29]
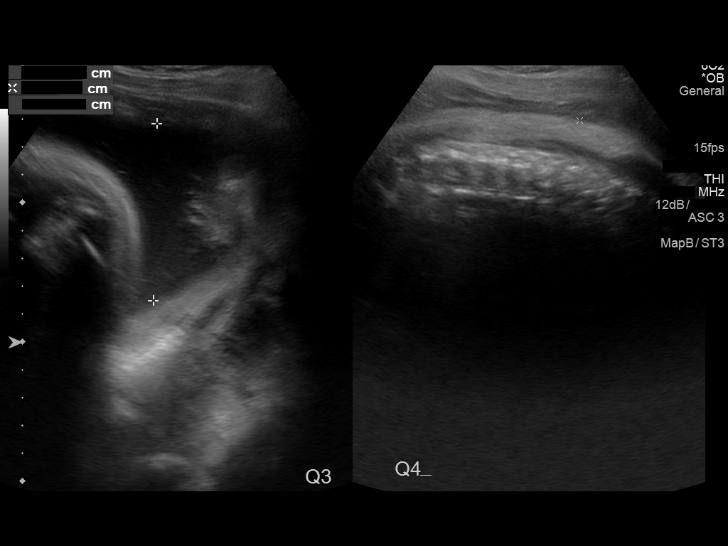
[im 14/29]
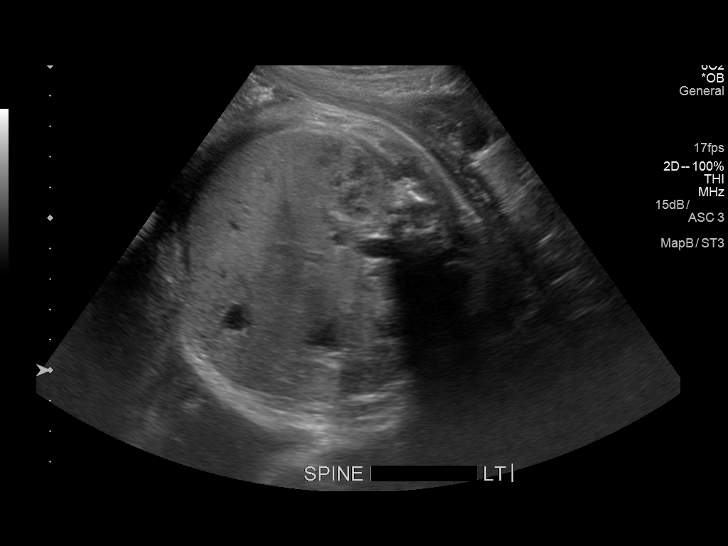
[im 16/29]
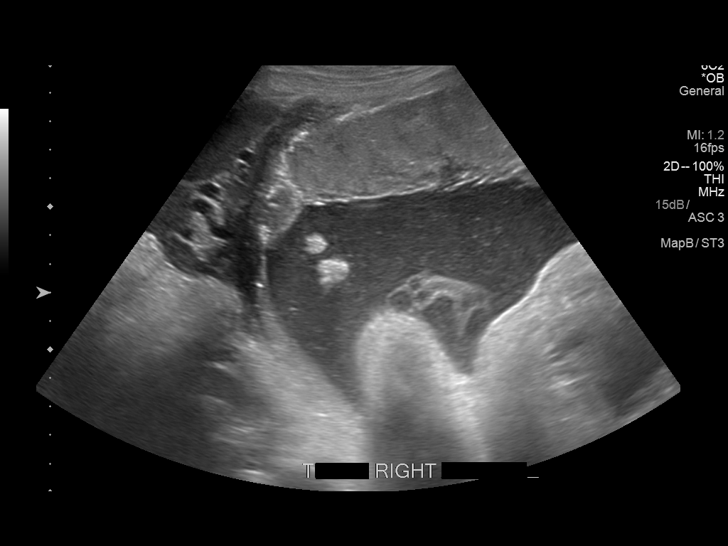
[im 18/29]
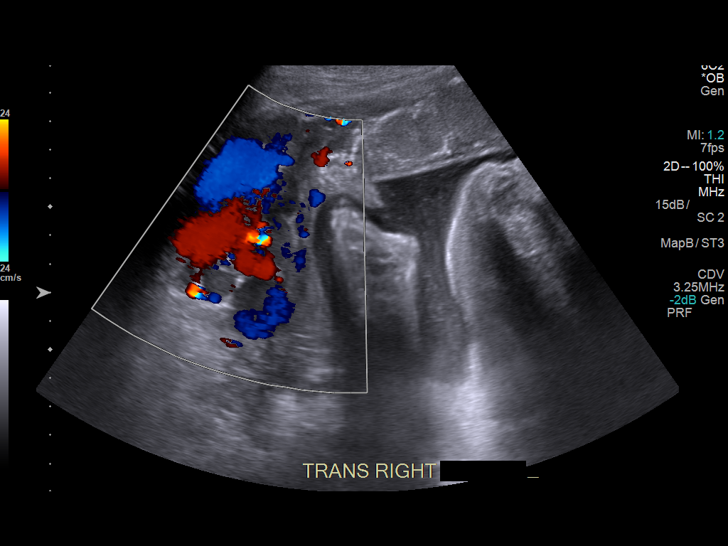
[im 20/29]
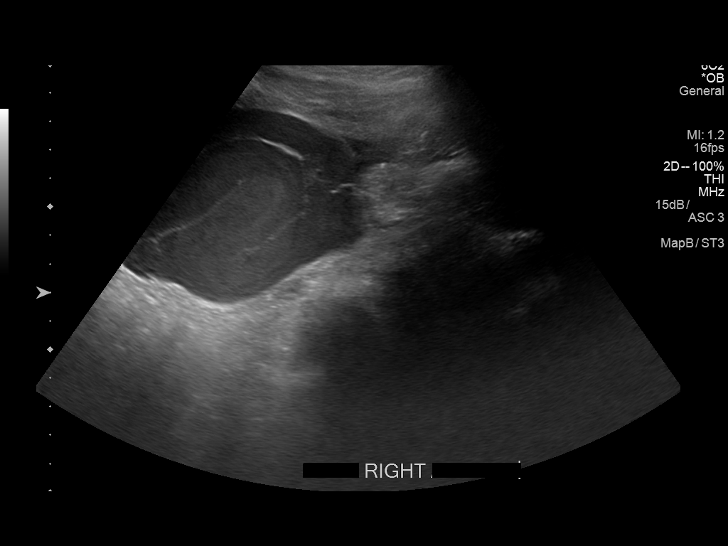
[im 22/29]
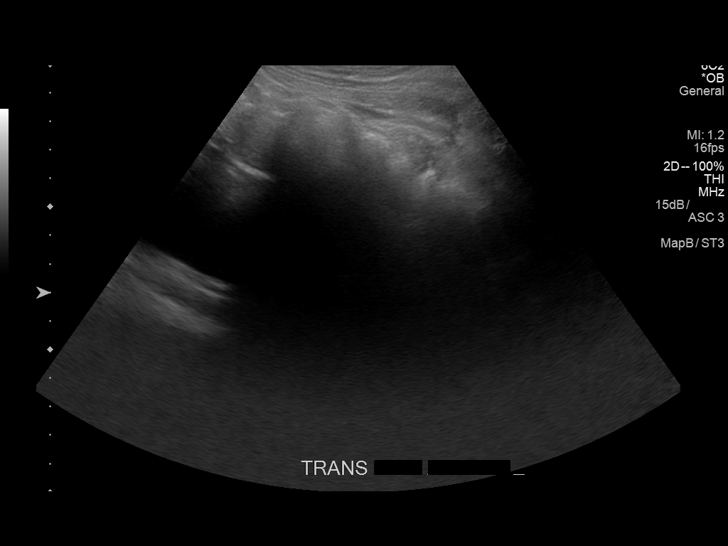
[im 24/29]
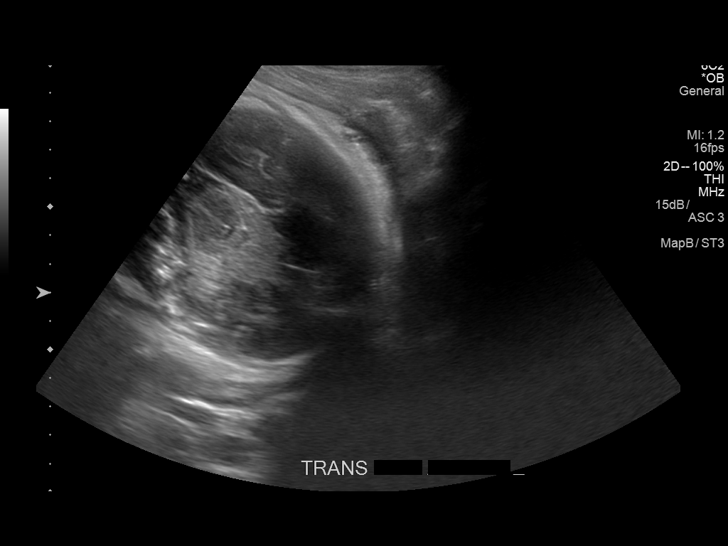
[im 26/29]
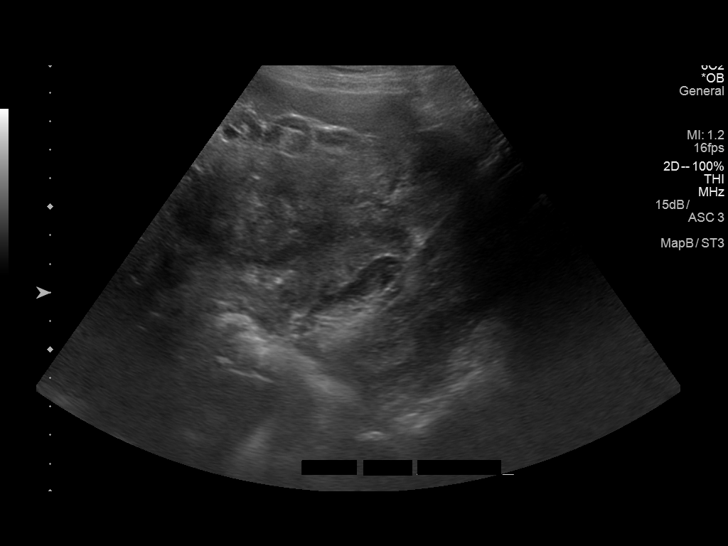
[im 29/29]
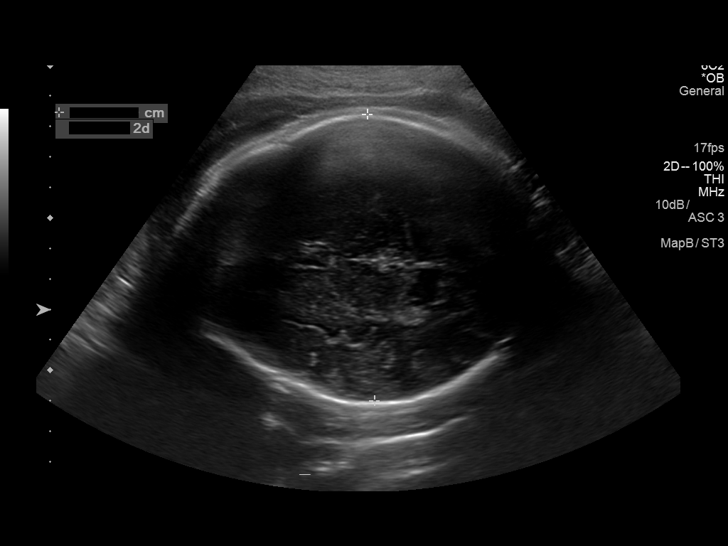

[14 of 28 positions shown; findings below may reference images not displayed]

FINDINGS: Number of Fetuses:  1

Heart Rate:  135 bpm

Movement:  Yes

Presentation: Cephalic

Placental Location: Anterior and right lateral

Previa: No

Amniotic Fluid (Subjective):  Within normal limits.

AFI:  16.9 cm

BPD:  9.4cm 38w  2d

MATERNAL FINDINGS:

Cervix:  Not well visualized

Uterus/Adnexae:  No abnormality visualized.
IMPRESSION: Single living IUP at approximately 38 weeks. No acute maternal
findings visualized.

This exam is performed on an emergent basis and does not
comprehensively evaluate fetal size, dating, or anatomy; follow-up
complete OB US should be considered if further fetal assessment is
warranted.

## 2017-03-19 ENCOUNTER — Emergency Department
Admission: EM | Admit: 2017-03-19 | Discharge: 2017-03-19 | Disposition: A | Discharge status: Home / Self Care | Payer: Medicaid Other | Attending: Emergency Medicine | Admitting: Emergency Medicine

## 2017-03-19 ENCOUNTER — Encounter: Payer: Self-pay | Admitting: Emergency Medicine

## 2017-03-19 DIAGNOSIS — K0889 Other specified disorders of teeth and supporting structures: Secondary | ICD-10-CM

## 2017-03-19 DIAGNOSIS — Z3A00 Weeks of gestation of pregnancy not specified: Secondary | ICD-10-CM | POA: Diagnosis not present

## 2017-03-19 DIAGNOSIS — Z79899 Other long term (current) drug therapy: Secondary | ICD-10-CM | POA: Diagnosis not present

## 2017-03-19 DIAGNOSIS — O9989 Other specified diseases and conditions complicating pregnancy, childbirth and the puerperium: Secondary | ICD-10-CM | POA: Insufficient documentation

## 2017-03-19 DIAGNOSIS — Z87891 Personal history of nicotine dependence: Secondary | ICD-10-CM | POA: Diagnosis not present

## 2017-03-19 LAB — CBC WITH DIFFERENTIAL/PLATELET
BASOS ABS: 0.1 10*3/uL (ref 0–0.1)
BASOS PCT: 1 %
EOS ABS: 0.1 10*3/uL (ref 0–0.7)
Eosinophils Relative: 1 %
HEMATOCRIT: 28.1 % — AB (ref 35.0–47.0)
HEMOGLOBIN: 9.4 g/dL — AB (ref 12.0–16.0)
Lymphocytes Relative: 39 %
Lymphs Abs: 3.1 10*3/uL (ref 1.0–3.6)
MCH: 27.5 pg (ref 26.0–34.0)
MCHC: 33.4 g/dL (ref 32.0–36.0)
MCV: 82.4 fL (ref 80.0–100.0)
MONOS PCT: 9 %
Monocytes Absolute: 0.7 10*3/uL (ref 0.2–0.9)
NEUTROS ABS: 4 10*3/uL (ref 1.4–6.5)
NEUTROS PCT: 50 %
Platelets: 168 10*3/uL (ref 150–440)
RBC: 3.41 MIL/uL — AB (ref 3.80–5.20)
RDW: 15.5 % — ABNORMAL HIGH (ref 11.5–14.5)
WBC: 8 10*3/uL (ref 3.6–11.0)

## 2017-03-19 LAB — BASIC METABOLIC PANEL
ANION GAP: 7 (ref 5–15)
BUN: <5 mg/dL — ABNORMAL LOW (ref 6–20)
CHLORIDE: 107 mmol/L (ref 101–111)
CO2: 22 mmol/L (ref 22–32)
CREATININE: 0.4 mg/dL — AB (ref 0.44–1.00)
Calcium: 7.9 mg/dL — ABNORMAL LOW (ref 8.9–10.3)
GFR calc non Af Amer: >60 mL/min (ref >60)
Glucose, Bld: 78 mg/dL (ref 65–99)
Potassium: 3.5 mmol/L (ref 3.5–5.1)
SODIUM: 136 mmol/L (ref 135–145)

## 2017-03-19 MED ORDER — PRENATAL VITAMINS 0.8 MG PO TABS: 1.0000 | ORAL_TABLET | Freq: Every day | ORAL | 0 refills | Status: DC

## 2017-03-19 MED ORDER — AMOXICILLIN 500 MG PO CAPS: 500.0000 mg | ORAL_CAPSULE | Freq: Three times a day (TID) | ORAL | 0 refills | Status: DC

## 2017-03-19 MED ORDER — ACETAMINOPHEN 500 MG PO TABS
500.0000 mg | ORAL_TABLET | Freq: Once | ORAL | Status: AC
  Administered 2017-03-19: 500 mg via ORAL
  Filled 2017-03-19: qty 1

## 2017-03-19 NOTE — ED Provider Notes (Signed)
[facility] Emergency Department Provider Note  ___________________________________________   First MD Initiated Contact with Patient 03/19/17 1301     (approximate)  I have reviewed the triage vital signs and the nursing notes.   HISTORY  Chief Complaint No chief complaint on file.   HPI Tara Raymond is a 26 y.o. female presents to the emergency room today with complaint of dental pain on the right side of her mouth with some facial swelling. Patient states that she took 2 aspirin this morning without relief and then took Tylenol which gave her minimal relief. Patient currently is pregnant but does not have any idea how many weeks she is when her due date is. Patient states that her last period was somewhere around 7 months ago. She saw all the health Department once approximately 5 months ago but is not followed up with anyone. She discontinue taking prenatal vitamins due to nausea. She denies any vaginal problems, bleeding, abdominal pain. She rates her pain as a 10 over 10 for her dental pain.   Past Medical History:  Diagnosis Date  . Mental disorder     Patient Active Problem List   Diagnosis Date Noted  . Anemia associated with acute blood loss 02/13/2016  . Postpartum care following cesarean delivery 02/12/2016  . Major depression, single episode 02/12/2016  . Postoperative anemia due to acute blood loss 02/12/2016  . Adjustment disorder with depressed mood 02/11/2016  . Substance abuse affecting pregnancy in third trimester, antepartum 02/09/2016  . Labor and delivery, indication for care 02/03/2016  . Substance induced mood disorder (HCC) 01/28/2016  . Severe major depression, single episode, without psychotic features (HCC) 01/28/2016  . Cocaine use disorder, moderate, dependence (HCC) 01/28/2016  . Opioid use disorder, moderate, dependence (HCC) 01/28/2016  . Opiate withdrawal (HCC) 01/28/2016    Past Surgical History:  Procedure  Laterality Date  . CESAREAN SECTION N/A 02/09/2016   Procedure: CESAREAN SECTION;  Surgeon: Nadara Mustardobert P Harris, MD;  Location: [facility] ORS;  Service: Obstetrics;  Laterality: N/A;    Prior to Admission medications   Medication Sig Start Date End Date Taking? Authorizing Provider  amoxicillin (AMOXIL) 500 MG capsule Take 1 capsule (500 mg total) by mouth 3 (three) times daily. 03/19/17   Tommi RumpsSummers, Rhonda L, PA-C  citalopram (CELEXA) 20 MG tablet Take 1 tablet (20 mg total) by mouth daily. 02/14/16   Clapacs, Jackquline DenmarkJohn T, MD  ferrous sulfate 325 (65 FE) MG tablet Take 1 tablet (325 mg total) by mouth 2 (two) times daily with a meal. 02/14/16   Brothers, Delaney Meigsamara, CNM  Prenatal Multivit-Min-Fe-FA (PRENATAL VITAMINS) 0.8 MG tablet Take 1 tablet by mouth daily. 03/19/17   Tommi RumpsSummers, Rhonda L, PA-C  senna-docusate (SENOKOT-S) 8.6-50 MG tablet Take 2 tablets by mouth at bedtime. 02/14/16   Marta AntuBrothers, Tamara, CNM    Allergies Naproxen  No family history on file.  Social History Social History  Substance Use Topics  . Smoking status: Former Smoker    Packs/day: 0.25    Types: Cigarettes  . Smokeless tobacco: Current User  . Alcohol use No    Review of Systems Constitutional: No fever/chills Eyes: No visual changes. ENT: Positive for dental pain. Cardiovascular: Denies chest pain. Respiratory: Denies shortness of breath. Gastrointestinal: No abdominal pain.  No nausea, no vomiting.   Genitourinary: Positive for pregnancy. Neurological: Negative for headaches ____________________________________________   PHYSICAL EXAM:  VITAL SIGNS: ED Triage Vitals [03/19/17 1255]  Enc Vitals Group     BP  Pulse      Resp      Temp      Temp src      SpO2      Weight      Height  (1.448 m)     Head Circumference      Peak Flow      Pain Score 10     Pain Loc      Pain Edu?      Excl. in GC?    Constitutional: Alert and oriented. Well appearing and in no acute distress. Eyes: Conjunctivae are normal.   Head: Atraumatic. Nose: No congestion/rhinnorhea. Mouth/Throat: Mucous membranes are moist.  Oropharynx non-erythematous. Right lower molars and gum area is extremely tender to palpation with a tongue depressor more on the lateral aspect been in the medial. There is some minimal soft tissue swelling noted around the mandible bilaterally.  Neck: No stridor.   Hematological/Lymphatic/Immunilogical: No cervical lymphadenopathy. Cardiovascular: Normal rate, regular rhythm. Grossly normal heart sounds.  Good peripheral circulation. Respiratory: Normal respiratory effort.  No retractions. Lungs CTAB. Gastrointestinal: Pregnant abdomen. Nontender. Musculoskeletal: Moves upper and lower extremities without any difficulty. Neurologic:  Normal speech and language. No gross focal neurologic deficits are appreciated. Skin:  Skin is warm, dry and intact. No rash noted. Psychiatric: Mood and affect are normal. Speech and behavior are normal.  ____________________________________________   LABS (all labs ordered are listed, but only abnormal results are displayed)  Labs Reviewed  CBC WITH DIFFERENTIAL/PLATELET - Abnormal; Notable for the following:       Result Value   RBC 3.41 (*)    Hemoglobin 9.4 (*)    HCT 28.1 (*)    RDW 15.5 (*)    All other components within normal limits  BASIC METABOLIC PANEL - Abnormal; Notable for the following:    BUN <5 (*)    Creatinine, Ser 0.40 (*)    Calcium 7.9 (*)    All other components within normal limits    PROCEDURES  Procedure(s) performed: None  Procedures  Critical Care performed: No  ____________________________________________   INITIAL IMPRESSION / ASSESSMENT AND PLAN / ED COURSE  Pertinent labs & imaging results that were available during my care of the patient were reviewed by me and considered in my medical decision making (see chart for details).  Patient admits that she has not had any prenatal care except for possibly one visit  to the health Department. She discontinued taking prenatal vitamins a long time ago due to nausea. Patient was made aware that she cannot take aspirin while being pregnant but that Tylenol is safe for her dental pain. Patient was also given a prescription for amoxicillin 500 mg 3 times a day for 10 days. She was given information today about the [facility] health Department for prenatal care or to contact Dr. Logan Bores who is the OB/GYN on call for unassigned patients.  I stressed to her the importance of getting an appointment and taking her prenatal vitamins with iron because of her anemia on her lab work today. She is to call on Monday for an appointment. In reviewing her chart she has had problems with anemia for a past pregnancy. It is also noted that patient currently admits to polysubstance abuse and is documented on her history.     ___________________________________________   FINAL CLINICAL IMPRESSION(S) / ED DIAGNOSES  Final diagnoses:  Pain, dental      NEW MEDICATIONS STARTED DURING THIS VISIT:  Discharge Medication List as of  03/19/2017  3:35 PM    START taking these medications   Details  amoxicillin (AMOXIL) 500 MG capsule Take 1 capsule (500 mg total) by mouth 3 (three) times daily., Starting Fri 03/19/2017, Print         Note:  This document was prepared using Dragon voice recognition software and may include unintentional dictation errors.    Tommi Rumps, PA-C 03/19/17 1601    Loleta Rose, MD 03/19/17 1620

## 2017-03-19 NOTE — Discharge Instructions (Signed)
Begin taking amoxicillin for dental pain. Take only Tylenol for pain. Begin taking prenatal vitamins with iron. Take with food. It is important that you call Monday morning to make an appointment with Dr. Logan BoresEvans or [facility].  These numbers are listed on your discharge papers. It is important that you have prenatal care!  Today's blood test shows that you are anemic.  Prenatal vitamins are extremely important at this time.

## 2017-03-19 NOTE — ED Triage Notes (Signed)
Pt to ED with c/o of dental pain to right side of mouth. Pt states she has been taking aspirin with no relief. Pt states she took tylenol today and that it helped to ease the pain. Pt is currently pregnant and concerned that she is taking too much aspirin. Pt states she also wants to have "the baby checked".

## 2017-03-19 NOTE — ED Notes (Signed)
Fetal Heart Tones-- 140

## 2017-04-17 ENCOUNTER — Observation Stay: Payer: Medicaid Other

## 2017-04-17 ENCOUNTER — Encounter: Payer: Self-pay | Admitting: *Deleted

## 2017-04-17 ENCOUNTER — Observation Stay
Admission: EM | Admit: 2017-04-17 | Discharge: 2017-04-17 | Disposition: A | Discharge status: Home / Self Care | Payer: Medicaid Other | Attending: Obstetrics and Gynecology | Admitting: Obstetrics and Gynecology

## 2017-04-17 DIAGNOSIS — Z3A38 38 weeks gestation of pregnancy: Secondary | ICD-10-CM | POA: Insufficient documentation

## 2017-04-17 DIAGNOSIS — O0930 Supervision of pregnancy with insufficient antenatal care, unspecified trimester: Secondary | ICD-10-CM

## 2017-04-17 DIAGNOSIS — Z87891 Personal history of nicotine dependence: Secondary | ICD-10-CM | POA: Diagnosis not present

## 2017-04-17 DIAGNOSIS — F1290 Cannabis use, unspecified, uncomplicated: Secondary | ICD-10-CM | POA: Diagnosis not present

## 2017-04-17 DIAGNOSIS — O99323 Drug use complicating pregnancy, third trimester: Secondary | ICD-10-CM | POA: Diagnosis not present

## 2017-04-17 DIAGNOSIS — O0933 Supervision of pregnancy with insufficient antenatal care, third trimester: Secondary | ICD-10-CM

## 2017-04-17 HISTORY — DX: Anemia, unspecified: D64.9

## 2017-04-17 LAB — URINE DRUG SCREEN, QUALITATIVE (ARMC ONLY)
AMPHETAMINES, UR SCREEN: NOT DETECTED
BARBITURATES, UR SCREEN: NOT DETECTED
BENZODIAZEPINE, UR SCRN: NOT DETECTED
Cannabinoid 50 Ng, Ur ~~LOC~~: POSITIVE — AB
Cocaine Metabolite,Ur ~~LOC~~: NOT DETECTED
MDMA (Ecstasy)Ur Screen: NOT DETECTED
METHADONE SCREEN, URINE: NOT DETECTED
OPIATE, UR SCREEN: POSITIVE — AB
Phencyclidine (PCP) Ur S: NOT DETECTED
TRICYCLIC, UR SCREEN: NOT DETECTED

## 2017-04-17 LAB — CHLAMYDIA/NGC RT PCR (ARMC ONLY)
Chlamydia Tr: NOT DETECTED
N gonorrhoeae: NOT DETECTED

## 2017-04-17 LAB — CBC
HCT: 33.1 % — ABNORMAL LOW (ref 35.0–47.0)
HEMOGLOBIN: 10.9 g/dL — AB (ref 12.0–16.0)
MCH: 26.2 pg (ref 26.0–34.0)
MCHC: 32.9 g/dL (ref 32.0–36.0)
MCV: 79.8 fL — ABNORMAL LOW (ref 80.0–100.0)
Platelets: 217 10*3/uL (ref 150–440)
RBC: 4.15 MIL/uL (ref 3.80–5.20)
RDW: 16.7 % — ABNORMAL HIGH (ref 11.5–14.5)
WBC: 8 10*3/uL (ref 3.6–11.0)

## 2017-04-17 LAB — TYPE AND SCREEN
ABO/RH(D): A POS
ANTIBODY SCREEN: NEGATIVE

## 2017-04-17 NOTE — OB Triage Note (Signed)
Reports her "water broke" between 2200 and 2300 last night. Reports white/clear fluid. Reports fetal movement. Elaina Hoops

## 2017-04-17 NOTE — Discharge Summary (Signed)
Physician Final Progress Note  Patient ID: Tara Raymond MRN: [id number] DOB/AGE: [birth year] 26 y.o.  Admit date: 04/17/2017 Admitting provider: Tresea Mall, CNM Discharge date: 04/17/2017   Admission Diagnoses: fluid leaking, no prenatal care, irregular contractions  Discharge Diagnoses:  Active Problems:   Indication for care in labor or delivery IUP at [redacted]w[redacted]d by u/s today, reactive NST, membranes intact, substance abuse  History of Present Illness: The patient is a 26 y.o. female [phone] at [redacted]w[redacted]d who presents for leakage of fluid between 10 and 11 PM last night. She reports occasional contractions that feel tight and mildly painful. She admits positive fetal movement. She admits to one prenatal visit [facility] for confirmation of pregnancy. She was seen at the ED for a syncopal episode in early March and was told she was less than [redacted] weeks pregnant at that time. She was seen for a dental problem in early September and admitted to no prenatal care and not knowing when her last period was. She admits to the use of marijuana during the pregnancy and to a "part of a percocet" occasionally for the tooth abscess pain she has had. She denies the use of any other illicit/recreational drugs. She did not finish her course of antibiotics and continues to have severe pain. She states she has been "cleaning up her life and getting off the streets" and so did not seek prenatal care. She admits to being in denial about this pregnancy and that is part of the reason she did not have prenatal care. She is hopeful that she will be able to keep this baby. Her other children live with their fathers. The patient is informed that she will have a c/section on Tuesday of the upcoming week. She will have a pre-op visit on Monday prior to surgery and will also need to have an H&P visit on Monday with the physician.   Past Medical History:  Diagnosis Date  . Anemia   . Mental disorder     Past  Surgical History:  Procedure Laterality Date  . CESAREAN SECTION N/A 02/09/2016   Procedure: CESAREAN SECTION;  Surgeon: Nadara Mustard, MD;  Location: [facility] ORS;  Service: Obstetrics;  Laterality: N/A;    No current facility-administered medications on file prior to encounter.    Current Outpatient Prescriptions on File Prior to Encounter  Medication Sig Dispense Refill  . Prenatal Multivit-Min-Fe-FA (PRENATAL VITAMINS) 0.8 MG tablet Take 1 tablet by mouth daily. 60 tablet 0  . amoxicillin (AMOXIL) 500 MG capsule Take 1 capsule (500 mg total) by mouth 3 (three) times daily. (Patient not taking: Reported on 04/17/2017) 30 capsule 0  . citalopram (CELEXA) 20 MG tablet Take 1 tablet (20 mg total) by mouth daily. (Patient not taking: Reported on 04/17/2017) 30 tablet 1  . ferrous sulfate 325 (65 FE) MG tablet Take 1 tablet (325 mg total) by mouth 2 (two) times daily with a meal. (Patient not taking: Reported on 04/17/2017) 60 tablet 3  . senna-docusate (SENOKOT-S) 8.6-50 MG tablet Take 2 tablets by mouth at bedtime. (Patient not taking: Reported on 04/17/2017) 60 tablet 3    Allergies  Allergen Reactions  . Naproxen Hives    Social History   Social History  . Marital status: Single    Spouse name: N/A  . Number of children: N/A  . Years of education: N/A   Occupational History  . Not on file.   Social History Main Topics  . Smoking status: Former Smoker  Packs/day: 0.25    Types: Cigarettes  . Smokeless tobacco: Current User  . Alcohol use No  . Drug use: Yes    Frequency: 2.0 times per week    Types: Cocaine, Marijuana, Heroin     Comment: Been in detox  . Sexual activity: Yes   Other Topics Concern  . Not on file   Social History Narrative  . No narrative on file    Physical Exam: BP 123/74 (BP Location: Left Arm)   Pulse 75   Temp 98 F (36.7 C) (Oral)   Resp 16   Ht  (1.448 m)   Wt 157 lb (71.2 kg)   LMP 07/27/2016   BMI 33.97 kg/m   Gen: NAD CV:  RRR Pulm: CTAB Pelvic: deferred Toco: occasional contraction Fetal well being: 125 bpm, moderate variability, +accelerations, -decelerations Ext: no evidence of DVT, no edema  Nitrazine negative, no evidence of wetness on underwear and no pad being worn  Consults: None  Significant Findings/ Diagnostic Studies: labs and ultrasound Results for NASHAE, MAUDLIN (MRN [id number]) as of 04/17/2017 18:22  Ref. Range 04/17/2017 16:03 04/17/2017 17:13 04/17/2017 17:49  WBC Latest Ref Range: 3.6 - 11.0 K/uL   8.0  RBC Latest Ref Range: 3.80 - 5.20 MIL/uL   4.15  Hemoglobin Latest Ref Range: 12.0 - 16.0 g/dL   40.9 (L)  HCT Latest Ref Range: 35.0 - 47.0 %   33.1 (L)  MCV Latest Ref Range: 80.0 - 100.0 fL   79.8 (L)  MCH Latest Ref Range: 26.0 - 34.0 pg   26.2  MCHC Latest Ref Range: 32.0 - 36.0 g/dL   81.1  RDW Latest Ref Range: 11.5 - 14.5 %   16.7 (H)  Platelets Latest Ref Range: 150 - 440 K/uL   217  Sample Expiration Unknown   04/20/2017  Antibody Screen Unknown   PENDING  ABO/RH(D) Unknown   PENDING  Amphetamines, Ur Screen Latest Ref Range: NONE DETECTED  NONE DETECTED    Barbiturates, Ur Screen Latest Ref Range: NONE DETECTED  NONE DETECTED    Benzodiazepine, Ur Scrn Latest Ref Range: NONE DETECTED  NONE DETECTED    Cocaine Metabolite,Ur [place] Latest Ref Range: NONE DETECTED  NONE DETECTED    Methadone Scn, Ur Latest Ref Range: NONE DETECTED  NONE DETECTED    MDMA (Ecstasy)Ur Screen Latest Ref Range: NONE DETECTED  NONE DETECTED    Cannabinoid 50 Ng, Ur [place] Latest Ref Range: NONE DETECTED  POSITIVE (A)    Opiate, Ur Screen Latest Ref Range: NONE DETECTED  POSITIVE (A)    Phencyclidine (PCP) Ur S Latest Ref Range: NONE DETECTED  NONE DETECTED    Tricyclic, Ur Screen Latest Ref Range: NONE DETECTED  NONE DETECTED    CULTURE, BETA STREP (GROUP B ONLY) Unknown Rpt    US OB COMP + 14 WK Unknown  Rpt    Other prenatal labs are pending  Procedures: NST  Discharge Condition:  good  Disposition: 01-Home or Self Care  Diet: Regular diet  Discharge Activity: Activity as tolerated  Discharge Instructions    Discharge activity:  No Restrictions    Complete by:  As directed    Discharge diet:  No restrictions    Complete by:  As directed    Fetal Kick Count:  Lie on our left side for one hour after a meal, and count the number of times your baby kicks.  If it is less than 5 times, get up, move around and  drink some juice.  Repeat the test 30 minutes later.  If it is still less than 5 kicks in an hour, notify your doctor.    Complete by:  As directed    LABOR:  When conractions begin, you should start to time them from the beginning of one contraction to the beginning  of the next.  When contractions are 5 - 10 minutes apart or less and have been regular for at least an hour, you should call your health care provider.    Complete by:  As directed    No sexual activity restrictions    Complete by:  As directed    Notify physician for bleeding from the vagina    Complete by:  As directed    Notify physician for blurring of vision or spots before the eyes    Complete by:  As directed    Notify physician for chills or fever    Complete by:  As directed    Notify physician for fainting spells, "black outs" or loss of consciousness    Complete by:  As directed    Notify physician for increase in vaginal discharge    Complete by:  As directed    Notify physician for leaking of fluid    Complete by:  As directed    Notify physician for pain or burning when urinating    Complete by:  As directed    Notify physician for pelvic pressure (sudden increase)    Complete by:  As directed    Notify physician for severe or continued nausea or vomiting    Complete by:  As directed    Notify physician for sudden gushing of fluid from the vagina (with or without continued leaking)    Complete by:  As directed    Notify physician for sudden, constant, or occasional abdominal  pain    Complete by:  As directed    Notify physician if baby moving less than usual    Complete by:  As directed      Allergies as of 04/17/2017      Reactions   Naproxen Hives      Medication List    STOP taking these medications   amoxicillin 500 MG capsule Commonly known as:  AMOXIL   citalopram 20 MG tablet Commonly known as:  CELEXA   ferrous sulfate 325 (65 FE) MG tablet   senna-docusate 8.6-50 MG tablet Commonly known as:  Senokot-S     TAKE these medications   Prenatal Vitamins 0.8 MG tablet Take 1 tablet by mouth daily.      Follow-up Information    [facility]. Go to.   Why:  pre op visit at [facility] on Monday 10/8 and H&P visit at [facility] on 04/19/2017 and to [facility] on Tuesday 10/9 for c/section Contact information: [address] [place] [address] [phone]          Total time spent taking care of this patient: 30 minutes  Signed: Tresea Mall, CNM  04/17/2017, 6:03 PM

## 2017-04-18 LAB — HEPATITIS B SURFACE ANTIGEN: Hepatitis B Surface Ag: NEGATIVE

## 2017-04-18 LAB — HIV ANTIBODY (ROUTINE TESTING W REFLEX): HIV Screen 4th Generation wRfx: NONREACTIVE

## 2017-04-18 LAB — RPR: RPR: NONREACTIVE

## 2017-04-19 ENCOUNTER — Encounter: Payer: Self-pay | Admitting: Obstetrics and Gynecology

## 2017-04-19 ENCOUNTER — Encounter
Admission: RE | Admit: 2017-04-19 | Discharge: 2017-04-19 | Disposition: A | Discharge status: Home / Self Care | Payer: Medicaid Other | Source: Ambulatory Visit | Attending: Obstetrics and Gynecology | Admitting: Obstetrics and Gynecology

## 2017-04-19 ENCOUNTER — Telehealth: Payer: Self-pay | Admitting: Obstetrics and Gynecology

## 2017-04-19 DIAGNOSIS — Z01812 Encounter for preprocedural laboratory examination: Secondary | ICD-10-CM | POA: Insufficient documentation

## 2017-04-19 DIAGNOSIS — D62 Acute posthemorrhagic anemia: Secondary | ICD-10-CM | POA: Insufficient documentation

## 2017-04-19 DIAGNOSIS — F1994 Other psychoactive substance use, unspecified with psychoactive substance-induced mood disorder: Secondary | ICD-10-CM

## 2017-04-19 DIAGNOSIS — F1420 Cocaine dependence, uncomplicated: Secondary | ICD-10-CM | POA: Insufficient documentation

## 2017-04-19 DIAGNOSIS — F1123 Opioid dependence with withdrawal: Secondary | ICD-10-CM | POA: Insufficient documentation

## 2017-04-19 HISTORY — DX: Dyspnea, unspecified: R06.00

## 2017-04-19 LAB — MEASLES/MUMPS/RUBELLA IMMUNITY
MUMPS IGG: 35.6 [AU]/ml (ref >10.9)
RUBELLA: 3.04 {index} (ref >0.99)

## 2017-04-19 LAB — VARICELLA ZOSTER ANTIBODY, IGG: VARICELLA IGG: 579 {index} (ref >165)

## 2017-04-19 NOTE — Patient Instructions (Signed)
Your procedure is scheduled on: 04/20/17 @ 9:45 am Report to Same Day Surgery 2nd floor medical mall [facility] Entrance-take elevator on left to 2nd floor.  Check in with surgery information desk.)   Remember: Instructions that are not followed completely may result in serious medical risk, up to and including death, or upon the discretion of your surgeon and anesthesiologist your surgery may need to be rescheduled.    _x___ 1. Do not eat food after midnight the night before your procedure. You may drink clear liquids up to 2 hours before you are scheduled to arrive at the hospital for your procedure.  Do not drink clear liquids within 2 hours of your scheduled arrival to the hospital.  Clear liquids include  --Water or Apple juice without pulp  --Clear carbohydrate beverage such as ClearFast or Gatorade  --Black Coffee or Clear Tea (No milk, no creamers, do not add anything to                  the coffee or Tea Type 1 and type 2 diabetics should only drink water.  No gum chewing or hard candies.     __x__ 2. No Alcohol for 24 hours before or after surgery.   __x__3. No Smoking for 24 prior to surgery.   ____  4. Bring all medications with you on the day of surgery if instructed.    __x__ 5. Notify your doctor if there is any change in your medical condition     (cold, fever, infections).     Do not wear jewelry, make-up, hairpins, clips or nail polish.  Do not wear lotions, powders, or perfumes. You may wear deodorant.  Do not shave 48 hours prior to surgery. Men may shave face and neck.  Do not bring valuables to the hospital.    [facility] is not responsible for any belongings or valuables.               Contacts, dentures or bridgework may not be worn into surgery.  Leave your suitcase in the car. After surgery it may be brought to your room.  For patients admitted to the hospital, discharge time is determined by your                       treatment team.   Patients  discharged the day of surgery will not be allowed to drive home.  You will need someone to drive you home and stay with you the night of your procedure.    Please read over the following fact sheets that you were given:   [facility] Preparing for Surgery and or MRSA Information   _x___ Take anti-hypertensive listed below, cardiac, seizure, asthma,     anti-reflux and psychiatric medicines. These include:  1. None  2.  3.  4.  5.  6.  ____Fleets enema or Magnesium Citrate as directed.   _x___ Use CHG Soap or sage wipes as directed on instruction sheet   ____ Use inhalers on the day of surgery and bring to hospital day of surgery  ____ Stop Metformin and Janumet 2 days prior to surgery.    ____ Take 1/2 of usual insulin dose the night before surgery and none on the morning     surgery.   _x___ Follow recommendations from Cardiologist, Pulmonologist or PCP regarding          stopping Aspirin, Coumadin, Plavix ,Eliquis, Effient, or Pradaxa, and Pletal.  X____Stop Anti-inflammatories  such as Advil, Aleve, Ibuprofen, Motrin, Naproxen, Naprosyn, Goodies powders or aspirin products. OK to take Tylenol and                          Celebrex.   _x___ Stop supplements until after surgery.  But may continue Vitamin D, Vitamin B,       and multivitamin.   ____ Bring C-Pap to the hospital.

## 2017-04-19 NOTE — Telephone Encounter (Signed)
Per Dr. Jean Rosenthal, the patient does not need to come to the office for an h&p since she was just seen Saturday. I spoke to the patient to give her the Pre-admit Testing appointment which is scheduled today at 12pm, but the patient said she doesn't know if she can make it, that she doesn't have any gas money but will try to be there.  Patient was given my phone# and ext.

## 2017-04-19 NOTE — Telephone Encounter (Signed)
-----   Message from Tresea Mall, CNM sent at 04/18/2017  8:50 AM EDT ----- Regarding: c/s tuesday with AMS Tara Raymond this patient came in Saturday with no prenatal care at [redacted]w[redacted]d by u/s on Saturday. She had a previous c/s 1.25 years ago. Dr Bonney Aid agreed to do repeat on Tuesday when he is in. I have left message on the OR schedule for the procedure. I will call them first thing Monday am to schedule her pre op but she also needs an H&P with ? On Monday.  The other problem is that she has limited phone access. Her mother's phone can only receive text messages. She said she would call [facility] mid morning on Monday to find out when these things will be happening.  Mother is Alona Bene: [phone] (I think this is the one that gets texts) Jarielys: [phone]  Thanks!

## 2017-04-20 ENCOUNTER — Inpatient Hospital Stay
Admission: RE | Admit: 2017-04-20 | Discharge: 2017-04-23 | DRG: 787 | Disposition: A | Discharge status: Home / Self Care | Payer: Medicaid Other | Source: Ambulatory Visit | Attending: Obstetrics and Gynecology | Admitting: Obstetrics and Gynecology

## 2017-04-20 ENCOUNTER — Inpatient Hospital Stay: Payer: Medicaid Other | Admitting: Anesthesiology

## 2017-04-20 ENCOUNTER — Encounter
Admission: RE | Disposition: A | Discharge status: Home / Self Care | Payer: Self-pay | Source: Ambulatory Visit | Attending: Obstetrics and Gynecology

## 2017-04-20 ENCOUNTER — Encounter: Payer: Self-pay | Admitting: Anesthesiology

## 2017-04-20 DIAGNOSIS — Z3A39 39 weeks gestation of pregnancy: Secondary | ICD-10-CM

## 2017-04-20 DIAGNOSIS — Z23 Encounter for immunization: Secondary | ICD-10-CM

## 2017-04-20 DIAGNOSIS — O9081 Anemia of the puerperium: Secondary | ICD-10-CM | POA: Diagnosis not present

## 2017-04-20 DIAGNOSIS — O34211 Maternal care for low transverse scar from previous cesarean delivery: Secondary | ICD-10-CM | POA: Diagnosis present

## 2017-04-20 DIAGNOSIS — Z87891 Personal history of nicotine dependence: Secondary | ICD-10-CM | POA: Diagnosis not present

## 2017-04-20 DIAGNOSIS — D62 Acute posthemorrhagic anemia: Secondary | ICD-10-CM | POA: Diagnosis not present

## 2017-04-20 DIAGNOSIS — F1990 Other psychoactive substance use, unspecified, uncomplicated: Secondary | ICD-10-CM | POA: Diagnosis not present

## 2017-04-20 DIAGNOSIS — O99324 Drug use complicating childbirth: Secondary | ICD-10-CM | POA: Diagnosis present

## 2017-04-20 LAB — URINE DRUG SCREEN, QUALITATIVE (ARMC ONLY)
AMPHETAMINES, UR SCREEN: NOT DETECTED
Barbiturates, Ur Screen: NOT DETECTED
Benzodiazepine, Ur Scrn: NOT DETECTED
Cannabinoid 50 Ng, Ur ~~LOC~~: POSITIVE — AB
Cocaine Metabolite,Ur ~~LOC~~: NOT DETECTED
MDMA (ECSTASY) UR SCREEN: NOT DETECTED
METHADONE SCREEN, URINE: NOT DETECTED
Opiate, Ur Screen: NOT DETECTED
PHENCYCLIDINE (PCP) UR S: NOT DETECTED
TRICYCLIC, UR SCREEN: NOT DETECTED

## 2017-04-20 LAB — CULTURE, BETA STREP (GROUP B ONLY)

## 2017-04-20 LAB — TYPE AND SCREEN
ABO/RH(D): A POS
ANTIBODY SCREEN: NEGATIVE

## 2017-04-20 SURGERY — Surgical Case
Anesthesia: Regional

## 2017-04-20 MED ORDER — SIMETHICONE 80 MG PO CHEW
80.0000 mg | CHEWABLE_TABLET | Freq: Three times a day (TID) | ORAL | Status: DC
  Administered 2017-04-20 – 2017-04-22 (×7): 80 mg via ORAL
  Filled 2017-04-20 (×7): qty 1

## 2017-04-20 MED ORDER — PROPOFOL 10 MG/ML IV BOLUS
INTRAVENOUS | Status: AC
  Filled 2017-04-20: qty 20

## 2017-04-20 MED ORDER — PRENATAL MULTIVITAMIN CH
1.0000 | ORAL_TABLET | Freq: Every day | ORAL | Status: DC
  Administered 2017-04-21 – 2017-04-22 (×2): 1 via ORAL
  Filled 2017-04-20 (×2): qty 1

## 2017-04-20 MED ORDER — OXYTOCIN 40 UNITS IN LACTATED RINGERS INFUSION - SIMPLE MED
INTRAVENOUS | Status: AC
  Filled 2017-04-20: qty 1000

## 2017-04-20 MED ORDER — MORPHINE SULFATE (PF) 0.5 MG/ML IJ SOLN
INTRAMUSCULAR | Status: DC | PRN
  Administered 2017-04-20: .1 mg via INTRATHECAL

## 2017-04-20 MED ORDER — LACTATED RINGERS IV SOLN
INTRAVENOUS | Status: DC
  Administered 2017-04-20 – 2017-04-21 (×2): via INTRAVENOUS

## 2017-04-20 MED ORDER — MENTHOL 3 MG MT LOZG
1.0000 | LOZENGE | OROMUCOSAL | Status: DC | PRN
  Filled 2017-04-20: qty 9

## 2017-04-20 MED ORDER — ONDANSETRON HCL 4 MG/2ML IJ SOLN
INTRAMUSCULAR | Status: DC | PRN
  Administered 2017-04-20: 4 mg via INTRAVENOUS

## 2017-04-20 MED ORDER — SENNOSIDES-DOCUSATE SODIUM 8.6-50 MG PO TABS
2.0000 | ORAL_TABLET | ORAL | Status: DC
  Administered 2017-04-21 – 2017-04-23 (×3): 2 via ORAL
  Filled 2017-04-20 (×3): qty 2

## 2017-04-20 MED ORDER — IBUPROFEN 200 MG PO TABS
600.0000 mg | ORAL_TABLET | Freq: Four times a day (QID) | ORAL | Status: DC
  Administered 2017-04-20: 600 mg via ORAL
  Filled 2017-04-20: qty 1

## 2017-04-20 MED ORDER — SIMETHICONE 80 MG PO CHEW
80.0000 mg | CHEWABLE_TABLET | ORAL | Status: DC
  Administered 2017-04-23: 80 mg via ORAL
  Filled 2017-04-20 (×2): qty 1

## 2017-04-20 MED ORDER — BUPIVACAINE IN DEXTROSE 0.75-8.25 % IT SOLN
INTRATHECAL | Status: DC | PRN
  Administered 2017-04-20: 1.6 mL via INTRATHECAL

## 2017-04-20 MED ORDER — LACTATED RINGERS IV SOLN
INTRAVENOUS | Status: DC
  Administered 2017-04-20: 1000 mL via INTRAVENOUS

## 2017-04-20 MED ORDER — OXYTOCIN 40 UNITS IN LACTATED RINGERS INFUSION - SIMPLE MED
2.5000 [IU]/h | INTRAVENOUS | Status: DC
  Filled 2017-04-20: qty 1000

## 2017-04-20 MED ORDER — FAMOTIDINE 20 MG PO TABS
20.0000 mg | ORAL_TABLET | Freq: Once | ORAL | Status: AC
  Administered 2017-04-20: 20 mg via ORAL
  Filled 2017-04-20: qty 1

## 2017-04-20 MED ORDER — COCONUT OIL OIL: 1.0000 "application " | TOPICAL_OIL | Status: DC | PRN

## 2017-04-20 MED ORDER — WITCH HAZEL-GLYCERIN EX PADS: 1.0000 "application " | MEDICATED_PAD | CUTANEOUS | Status: DC | PRN

## 2017-04-20 MED ORDER — BUPIVACAINE 0.25 % ON-Q PUMP DUAL CATH 400 ML
400.0000 mL | INJECTION | Status: DC
  Filled 2017-04-20: qty 400

## 2017-04-20 MED ORDER — PHENYLEPHRINE HCL 10 MG/ML IJ SOLN
INTRAMUSCULAR | Status: DC | PRN
  Administered 2017-04-20: 100 ug via INTRAVENOUS

## 2017-04-20 MED ORDER — IBUPROFEN 600 MG PO TABS: 600.0000 mg | ORAL_TABLET | Freq: Four times a day (QID) | ORAL | Status: DC

## 2017-04-20 MED ORDER — OXYCODONE-ACETAMINOPHEN 5-325 MG PO TABS: 1.0000 | ORAL_TABLET | ORAL | Status: DC | PRN

## 2017-04-20 MED ORDER — OXYCODONE HCL 5 MG PO TABS
5.0000 mg | ORAL_TABLET | ORAL | Status: DC | PRN
  Administered 2017-04-20 – 2017-04-22 (×9): 10 mg via ORAL
  Filled 2017-04-20: qty 1
  Filled 2017-04-20 (×7): qty 2
  Filled 2017-04-20: qty 1
  Filled 2017-04-20: qty 2

## 2017-04-20 MED ORDER — ACETAMINOPHEN 325 MG PO TABS
650.0000 mg | ORAL_TABLET | ORAL | Status: DC | PRN
  Administered 2017-04-20: 650 mg via ORAL
  Filled 2017-04-20: qty 2

## 2017-04-20 MED ORDER — ONDANSETRON HCL 4 MG/2ML IJ SOLN: 4.0000 mg | Freq: Once | INTRAMUSCULAR | Status: DC | PRN

## 2017-04-20 MED ORDER — DIBUCAINE 1 % RE OINT: 1.0000 "application " | TOPICAL_OINTMENT | RECTAL | Status: DC | PRN

## 2017-04-20 MED ORDER — BUPIVACAINE HCL (PF) 0.5 % IJ SOLN
INTRAMUSCULAR | Status: AC
  Filled 2017-04-20: qty 30

## 2017-04-20 MED ORDER — DEXTROSE 5 % IV SOLN
2.0000 g | INTRAVENOUS | Status: DC
  Filled 2017-04-20: qty 2000

## 2017-04-20 MED ORDER — SIMETHICONE 80 MG PO CHEW: 80.0000 mg | CHEWABLE_TABLET | ORAL | Status: DC | PRN

## 2017-04-20 MED ORDER — MORPHINE SULFATE (PF) 0.5 MG/ML IJ SOLN
INTRAMUSCULAR | Status: AC
  Filled 2017-04-20: qty 10

## 2017-04-20 MED ORDER — OXYCODONE-ACETAMINOPHEN 5-325 MG PO TABS
2.0000 | ORAL_TABLET | ORAL | Status: DC | PRN
  Administered 2017-04-22 – 2017-04-23 (×6): 2 via ORAL
  Filled 2017-04-20 (×7): qty 2

## 2017-04-20 MED ORDER — TETANUS-DIPHTH-ACELL PERTUSSIS 5-2.5-18.5 LF-MCG/0.5 IM SUSP
0.5000 mL | Freq: Once | INTRAMUSCULAR | Status: AC
  Administered 2017-04-23: 0.5 mL via INTRAMUSCULAR
  Filled 2017-04-20: qty 0.5

## 2017-04-20 MED ORDER — SOD CITRATE-CITRIC ACID 500-334 MG/5ML PO SOLN
30.0000 mL | ORAL | Status: AC
  Administered 2017-04-20: 30 mL via ORAL
  Filled 2017-04-20: qty 15

## 2017-04-20 MED ORDER — OXYTOCIN 40 UNITS IN LACTATED RINGERS INFUSION - SIMPLE MED
INTRAVENOUS | Status: DC | PRN
  Administered 2017-04-20: 500 mL via INTRAVENOUS

## 2017-04-20 MED ORDER — BUPIVACAINE ON-Q PAIN PUMP (FOR ORDER SET NO CHG)
INJECTION | Status: DC
  Filled 2017-04-20: qty 1

## 2017-04-20 MED ORDER — IBUPROFEN 200 MG PO TABS
600.0000 mg | ORAL_TABLET | Freq: Four times a day (QID) | ORAL | Status: DC
  Administered 2017-04-20 – 2017-04-23 (×11): 600 mg via ORAL
  Filled 2017-04-20 (×11): qty 1

## 2017-04-20 MED ORDER — BUPIVACAINE HCL (PF) 0.5 % IJ SOLN
INTRAMUSCULAR | Status: DC | PRN
  Administered 2017-04-20: 5 mL

## 2017-04-20 MED ORDER — FENTANYL CITRATE (PF) 100 MCG/2ML IJ SOLN
25.0000 ug | INTRAMUSCULAR | Status: AC | PRN
  Administered 2017-04-20 (×6): 25 ug via INTRAVENOUS
  Filled 2017-04-20 (×2): qty 2

## 2017-04-20 MED ORDER — DIPHENHYDRAMINE HCL 25 MG PO CAPS: 25.0000 mg | ORAL_CAPSULE | Freq: Four times a day (QID) | ORAL | Status: DC | PRN

## 2017-04-20 MED ORDER — BUPIVACAINE HCL 0.5 % IJ SOLN
50.0000 mL | Freq: Once | INTRAMUSCULAR | Status: DC
  Filled 2017-04-20: qty 50

## 2017-04-20 SURGICAL SUPPLY — 28 items
BAG COUNTER SPONGE EZ (MISCELLANEOUS) ×2 IMPLANT
CANISTER SUCT 3000ML PPV (MISCELLANEOUS) ×3 IMPLANT
CATH KIT ON-Q SILVERSOAK 5IN (CATHETERS) ×6 IMPLANT
CHLORAPREP W/TINT 26ML (MISCELLANEOUS) ×6 IMPLANT
CLOSURE WOUND 1/2 X4 (GAUZE/BANDAGES/DRESSINGS) ×1
COUNTER SPONGE BAG EZ (MISCELLANEOUS) ×1
DERMABOND ADVANCED (GAUZE/BANDAGES/DRESSINGS) ×2
DERMABOND ADVANCED .7 DNX12 (GAUZE/BANDAGES/DRESSINGS) ×1 IMPLANT
DRSG OPSITE POSTOP 4X10 (GAUZE/BANDAGES/DRESSINGS) ×3 IMPLANT
DRSG TELFA 3X8 NADH (GAUZE/BANDAGES/DRESSINGS) ×3 IMPLANT
ELECT CAUTERY BLADE 6.4 (BLADE) IMPLANT
ELECT REM PT RETURN 9FT ADLT (ELECTROSURGICAL) ×3
ELECTRODE REM PT RTRN 9FT ADLT (ELECTROSURGICAL) ×1 IMPLANT
GAUZE SPONGE 4X4 12PLY STRL (GAUZE/BANDAGES/DRESSINGS) ×3 IMPLANT
GLOVE BIO SURGEON STRL SZ7 (GLOVE) ×15 IMPLANT
GLOVE INDICATOR 7.5 STRL GRN (GLOVE) ×15 IMPLANT
GOWN STRL REUS W/ TWL LRG LVL3 (GOWN DISPOSABLE) ×3 IMPLANT
GOWN STRL REUS W/TWL LRG LVL3 (GOWN DISPOSABLE) ×6
NS IRRIG 1000ML POUR BTL (IV SOLUTION) ×3 IMPLANT
PACK C SECTION AR (MISCELLANEOUS) ×3 IMPLANT
PAD OB MATERNITY 4.3X12.25 (PERSONAL CARE ITEMS) ×3 IMPLANT
PAD PREP 24X41 OB/GYN DISP (PERSONAL CARE ITEMS) ×3 IMPLANT
STRIP CLOSURE SKIN 1/2X4 (GAUZE/BANDAGES/DRESSINGS) ×2 IMPLANT
SUT MNCRL AB 4-0 PS2 18 (SUTURE) ×3 IMPLANT
SUT PDS AB 1 TP1 96 (SUTURE) ×3 IMPLANT
SUT VIC AB 0 CTX 36 (SUTURE) ×4
SUT VIC AB 0 CTX36XBRD ANBCTRL (SUTURE) ×2 IMPLANT
SUT VIC AB 2-0 CT1 36 (SUTURE) IMPLANT

## 2017-04-20 NOTE — Op Note (Signed)
Preoperative Diagnosis: 1) 26 y.o. [id number] at [redacted]w[redacted]d 2) History of prior Cesarean section 3) No prenatal care 4) History of polysubstance abuse  Postoperative Diagnosis: 1) 26 y.o. [id number] at [redacted]w[redacted]d 2) History of prior Cesarean section 3) No prenatal care 4) History of polysubstance abuse  Operation Performed: Repeat low transverse C-section via pfannenstiel skin incision  Indiciation: Repeat  Anesthesia: Spinal  Primary Surgeon: Vena Austria, MD   Assistant:Robert Tiburcio Pea, MD  Preoperative Antibiotics: 2g ancef  Estimated Blood Loss:  IV Fluids: 1L  Drains or Tubes: Foley to gravity drainage, ON-Q catheter system  Implants: none  Specimens Removed: none  Complications: none  Intraoperative Findings:  Normal tubes ovaries and uterus.  Delivery resulted in the birth of a liveborn female, APGAR (1 MIN): 8   APGAR (5 MINS): 9, weight 9lbs  Patient Condition:stable  Procedure in Detail:  Patient was taken to the operating room were she was administered regional anesthesia.  She was positioned in the supine position, prepped and draped in the  Usual sterile fashion.  Prior to proceeding with the case a time out was performed and the level of anesthetic was checked and noted to be adequate.  Utilizing the scalpel a pfannenstiel skin incision was made 2cm above the pubic symphysis utilizing the patient's pre-existing scar and carried down sharply to the the level of the rectus fascia.  The fascia was incised in the midline using the scalpel and then extended using mayo scissors.  The superior border of the rectus fascia was grasped with two Kocher clamps and the underlying rectus muscles were dissected of the fascia using blunt dissection.  The median raphae was incised using Mayo scissors.   The inferior border of the rectus fascia was dissected of the rectus muscles in a similar fashion.  The midline was identified, the peritoneum was entered bluntly and expanded using  manual tractions.  The uterus was noted to be in a none rotated position.  Next the bladder blade was placed retracting the bladder caudally.  A bladder flap was created.  The bladder reflection was grasped with a pickup, and Metzenbaum scissors were then used the undermine the bladder reflection.  The bladder flap was developed using digital dissection.  The bladder blade was replaced retracting the bladder caudally out of the operative field. A low transverse incision was scored on the lower uterine segment.  The hysterotomy was entered bluntly using the operators finger.  The hysterotomy incision was extended using manual traction.  The operators hand was placed within the hysterotomy position noting the fetus to be within the OA position.  The vertex was grasped, flexed, brought to the incision, and delivered a traumatically using fundal pressure.  The remainder of the body delivered with ease.  The infant was suctioned, cord was clamped and cut before handing off to the awaiting neonatologist.  The placenta was delivered using manual extraction.  The uterus was exteriorized, wiped clean of clots and debris using two moist laps.  The hysterotomy was closed using a two layer closure of 0 Vicryl, with the first being a running locked, the second a vertical imbricating.  The uterus was returned to the abdomen.  The peritoneal gutters were wiped clean of clots and debris using two moist laps.  The hysterotomy incision was re-inspected noted to be hemostatic.  The rectus muscles were re-approximated in the midline using a single 2-0 Vicryl mattress stitch.  The rectus muscles were inspected noted to be hemostatic.  The superior border of  the rectus fascia was grasped with a Kocher clamp.  The ON-Q trocars were then placed 4cm above the superior border of the incision and tunneled subfascially.  The introducers were removed and the catheters were threaded through the sleeves after which the sleeves were removed.   The fascia was closed using a looped #1 PDS in a running fashion taking 1cm by 1cm bites.  The subcutaneous tissue was irrigated using warm saline, hemostasis achieved using the bovie.  The subcutaneous dead space was less than 3cm and was closed.  The skin was closed using 4-0 monocryl.  Sponge needle and instrument counts were corrects times two.  The patient tolerated the procedure well and was taken to the recovery room in stable condition.

## 2017-04-20 NOTE — Anesthesia Procedure Notes (Signed)
Spinal  Start time: 04/20/2017 11:37 AM End time: 04/20/2017 11:42 AM Staffing Anesthesiologist: Yves Dill Resident/CRNA: Irving Burton Performed: resident/CRNA  Preanesthetic Checklist Completed: patient identified, site marked, surgical consent, pre-op evaluation, IV checked, risks and benefits discussed and monitors and equipment checked Spinal Block Patient position: sitting Prep: ChloraPrep Patient monitoring: continuous pulse ox, blood pressure and heart rate Approach: midline Location: L3-4 Injection technique: single-shot Needle Needle type: Pencan  Needle gauge: 24 G

## 2017-04-20 NOTE — Transfer of Care (Signed)
Immediate Anesthesia Transfer of Care Note  Patient: Tara Raymond  Procedure(s) Performed: CESAREAN SECTION (N/A )  Patient Location: Mother/Baby  Anesthesia Type:Spinal  Level of Consciousness: awake, alert  and oriented  Airway & Oxygen Therapy: Patient Spontanous Breathing  Post-op Assessment: Report given to RN  Post vital signs: stable  Last Vitals:  Vitals:   04/20/17 1020 04/20/17 1253  BP: 140/86 113/77  Pulse: 61 66  Resp: 18 16  Temp: 37 C   SpO2:  100%    Last Pain:  Vitals:   04/20/17 1020  TempSrc: Oral         Complications: No apparent anesthesia complications

## 2017-04-20 NOTE — Clinical Social Work Note (Signed)
CSW received consult to see patient due to long standing history of substance abuse. Patient is known to CSW from previous deliveries. Nursing states patient allegedly does not have custody of any of her other children. Patient is marijuana positive and will need to await infant drug screen. As patient is having a c section today, CSW will see patient tomorrow. York Spaniel MSW,LCSW [phone]

## 2017-04-20 NOTE — H&P (Addendum)
   Obstetric H&P   Chief Complaint: Scheduled C-section  Prenatal Care Provider: No PNC  History of Present Illness: 26 y.o. [id number] [redacted]w[redacted]d by 04/25/2017, by Ultrasound presenting to L&D for scheduled C-section secondary to history of prior Cesarean section.  History of significant polysubstance abuse, no PNC, short interval pregnancy, and history of prior Cesarean section.  She does not have custody of her other childern.  +FM, no LOF, no VB, no ctx. EFW 3750g   Review of Systems: 10 point review of systems negative unless otherwise noted in HPI  Past Medical History: Past Medical History:  Diagnosis Date  . Anemia   . Dyspnea    with pregnancy  . Mental disorder     Past Surgical History: Past Surgical History:  Procedure Laterality Date  . CESAREAN SECTION N/A 02/09/2016   Procedure: CESAREAN SECTION;  Surgeon: Nadara Mustard, MD;  Location: [facility] ORS;  Service: Obstetrics;  Laterality: N/A;   Family History: History reviewed. No pertinent family history.  Social History: Social History   Social History  . Marital status: Single    Spouse name: N/A  . Number of children: N/A  . Years of education: N/A   Occupational History  . Not on file.   Social History Main Topics  . Smoking status: Former Smoker    Packs/day: 0.25    Types: Cigarettes  . Smokeless tobacco: Current User  . Alcohol use No  . Drug use: Yes    Frequency: 2.0 times per week    Types: Cocaine, Marijuana, Heroin     Comment: Been in detox  . Sexual activity: Yes   Other Topics Concern  . Not on file   Social History Narrative  . No narrative on file    Medications: Prior to Admission medications   Medication Sig Start Date End Date Taking? Authorizing Provider  acetaminophen (TYLENOL) 500 MG tablet Take 500-1,000 mg by mouth every 6 (six) hours as needed (for pain.).   Yes [provider]  oxyCODONE-acetaminophen (PERCOCET/ROXICET) 5-325 MG tablet Take 1 tablet by mouth every 8  (eight) hours as needed (for abscess tooth.).   Yes [provider]  Prenatal Multivit-Min-Fe-FA (PRENATAL VITAMINS) 0.8 MG tablet Take 1 tablet by mouth daily. Patient taking differently: Take 1 tablet by mouth at bedtime.  03/19/17  Yes Tommi Rumps, PA-C    Allergies: Allergies  Allergen Reactions  . Naproxen Hives    Physical Exam: Vitals: Last menstrual period 07/27/2016, unknown if currently breastfeeding. There is no height or weight on file to calculate BMI.  General: NAD HEENT: normocephalic, anicteric Pulmonary: No increased work of breathing Cardiovascular: RRR, distal pulses 2+ Abdomen: Gravid, non-tender Genitourinary: deferred Extremities: no edema, erythema, or tenderness Neurologic: Grossly intact Psychiatric: mood appropriate, affect full  Labs: No results found for this or any previous visit (from the past 24 hour(s)).  Assessment: 26 y.o. [id number] [redacted]w[redacted]d by 04/25/2017, by Ultrasound at 38 weeks  Plan: 1) Proceed with repeat cesarean section  2) Fetus - high risk of NAS - UDS on admission  3) PNL - Blood type --/--/A POS (10/06 1749) / Anti-bodyscreen NEG (10/06 1749) / Rubella 3.04 (10/06 1749) / Varicella Immune / RPR Non Reactive (10/06 1749) / HBsAg Negative (10/06 1749) / HIV negative / 1-hr OGTT unknown / GBS unknown  4) Immunization History -  There is no immunization history for the selected administration types on file for this patient.  5) Disposition - pending delivery   ]

## 2017-04-20 NOTE — Anesthesia Post-op Follow-up Note (Signed)
Anesthesia QCDR form completed.        

## 2017-04-20 NOTE — Anesthesia Preprocedure Evaluation (Addendum)
Anesthesia Evaluation  Patient identified by MRN, date of birth, ID band Patient awake    Reviewed: Allergy & Precautions, H&P , NPO status , Patient's Chart, lab work & pertinent test results, reviewed documented beta blocker date and time   Airway Mallampati: III  TM Distance: >3 FB Neck ROM: full    Dental no notable dental hx. (+) Teeth Intact, Poor Dentition   Pulmonary neg pulmonary ROS, shortness of breath and with exertion, Current Smoker, former smoker,    Pulmonary exam normal breath sounds clear to auscultation       Cardiovascular Exercise Tolerance: Good negative cardio ROS Normal cardiovascular exam Rhythm:regular Rate:Normal     Neuro/Psych PSYCHIATRIC DISORDERS Depression negative neurological ROS  negative psych ROS   GI/Hepatic negative GI ROS, Neg liver ROS, (+)     substance abuse  cocaine use,   Endo/Other  negative endocrine ROS  Renal/GU negative Renal ROS  negative genitourinary   Musculoskeletal   Abdominal   Peds  Hematology negative hematology ROS (+) anemia ,   Anesthesia Other Findings   Reproductive/Obstetrics (+) Pregnancy                             Anesthesia Physical  Anesthesia Plan  ASA: III and emergent  Anesthesia Plan: Regional and Spinal   Post-op Pain Management:    Induction:   PONV Risk Score and Plan:   Airway Management Planned:   Additional Equipment:   Intra-op Plan:   Post-operative Plan:   Informed Consent: I have reviewed the patients History and Physical, chart, labs and discussed the procedure including the risks, benefits and alternatives for the proposed anesthesia with the patient or authorized representative who has indicated his/her understanding and acceptance.     Plan Discussed with: CRNA  Anesthesia Plan Comments: (Pts high risk status with opioid and cocaine abuse reviewed and discussed with patient.  Will  proceed with SAB>  JA)        Anesthesia Quick Evaluation

## 2017-04-21 DIAGNOSIS — D62 Acute posthemorrhagic anemia: Secondary | ICD-10-CM

## 2017-04-21 DIAGNOSIS — O99324 Drug use complicating childbirth: Secondary | ICD-10-CM

## 2017-04-21 DIAGNOSIS — O9081 Anemia of the puerperium: Secondary | ICD-10-CM

## 2017-04-21 DIAGNOSIS — F1990 Other psychoactive substance use, unspecified, uncomplicated: Secondary | ICD-10-CM

## 2017-04-21 LAB — CBC
HEMATOCRIT: 27.7 % — AB (ref 35.0–47.0)
Hemoglobin: 9.1 g/dL — ABNORMAL LOW (ref 12.0–16.0)
MCH: 25.7 pg — ABNORMAL LOW (ref 26.0–34.0)
MCHC: 32.8 g/dL (ref 32.0–36.0)
MCV: 78.2 fL — AB (ref 80.0–100.0)
Platelets: 203 10*3/uL (ref 150–440)
RBC: 3.55 MIL/uL — ABNORMAL LOW (ref 3.80–5.20)
RDW: 16.9 % — AB (ref 11.5–14.5)
WBC: 9.7 10*3/uL (ref 3.6–11.0)

## 2017-04-21 MED ORDER — INFLUENZA VAC SPLIT QUAD 0.5 ML IM SUSY
0.5000 mL | PREFILLED_SYRINGE | INTRAMUSCULAR | Status: AC | PRN
  Administered 2017-04-23: 0.5 mL via INTRAMUSCULAR
  Filled 2017-04-21: qty 0.5

## 2017-04-21 NOTE — Anesthesia Postprocedure Evaluation (Signed)
Anesthesia Post Note  Patient: Tara Raymond  Procedure(s) Performed: CESAREAN SECTION (N/A )  Patient location during evaluation: Mother Baby Anesthesia Type: Spinal Level of consciousness: awake, awake and alert and oriented Pain management: pain level controlled Vital Signs Assessment: post-procedure vital signs reviewed and stable Respiratory status: spontaneous breathing Cardiovascular status: blood pressure returned to baseline Postop Assessment: no headache, no backache, no apparent nausea or vomiting and adequate PO intake Anesthetic complications: no     Last Vitals:  Vitals:   04/20/17 2325 04/21/17 0341  BP: 137/89 120/72  Pulse: 65 69  Resp: 16 16  Temp: 36.6 C 36.7 C  SpO2: 100% 98%    Last Pain:  Vitals:   04/21/17 0627  TempSrc:   PainSc: 8                  Tara Raymond

## 2017-04-21 NOTE — Anesthesia Post-op Follow-up Note (Signed)
  Anesthesia Pain Follow-up Note  Patient: Tara Raymond  Day #: 1  Date of Follow-up: 04/21/2017 Time: 7:20 AM  Last Vitals:  Vitals:   04/20/17 2325 04/21/17 0341  BP: 137/89 120/72  Pulse: 65 69  Resp: 16 16  Temp: 36.6 C 36.7 C  SpO2: 100% 98%    Level of Consciousness: alert  Pain: none   Side Effects:None  Catheter Site Exam:clean, dry, no drainage     Plan: D/C from anesthesia care at surgeon's request  Karoline Caldwell

## 2017-04-21 NOTE — Progress Notes (Signed)
Patient ID: Tara Raymond, female   DOB: Nov 01, 1990, 26 y.o.   MRN: [id number]  Obstetric Postpartum/PostOperative Daily Progress Note Subjective:  26 y.o. [id number] POD#1 status post repeat cesarean delivery.  She is ambulating, is tolerating po, is voiding spontaneously.  Her pain is well controlled on PO pain medications. Her lochia is less than menses.   Medications SCHEDULED MEDICATIONS  . ibuprofen  600 mg Oral Q6H  . prenatal multivitamin  1 tablet Oral Q1200  . senna-docusate  2 tablet Oral Q24H  . simethicone  80 mg Oral TID PC  . simethicone  80 mg Oral Q24H  . Tdap  0.5 mL Intramuscular Once    MEDICATION INFUSIONS  . bupivacaine 0.25 % ON-Q pump DUAL CATH 400 mL    . bupivacaine ON-Q pain pump    . lactated ringers 125 mL/hr at 04/21/17 0039    PRN MEDICATIONS  acetaminophen, coconut oil, witch hazel-glycerin **AND** dibucaine, diphenhydrAMINE, menthol-cetylpyridinium, ondansetron (ZOFRAN) IV, oxyCODONE, oxyCODONE-acetaminophen, oxyCODONE-acetaminophen, simethicone    Objective:   Vitals:   04/20/17 2011 04/20/17 2325 04/21/17 0341 04/21/17 0800  BP: 120/81 137/89 120/72 133/87  Pulse: 73 65 69 66  Resp: Temp: 98.1 F (36.7 C) 97.8 F (36.6 C) 98 F (36.7 C) 98.1 F (36.7 C)  TempSrc: Oral Oral Oral   SpO2: 100% 100% 98% 100%  Weight:      Height:        Current Vital Signs 24h Vital Sign Ranges  T 98.1 F (36.7 C) Temp  Avg: 97.9 F (36.6 C)  Min: 97.4 F (36.3 C)  Max: 98.3 F (36.8 C)  BP 133/87 BP  Min: 112/74  Max: 137/89  HR 66 Pulse  Avg: 66  Min: 59  Max: 73  RR 18 Resp  Avg: 17.4  Min: 16  Max: 20  SaO2 100 % Not Delivered SpO2  Avg: 99.7 %  Min: 98 %  Max: 100 %       24 Hour I/O Current Shift I/O  Time Ins Outs 10/09 0701 - 10/10 0700 In: 1762 [I.V.:1762] Out: 5250 [Urine:4650] 10/10 0701 - 10/10 1900 In: 896.3 [P.O.:240; I.V.:656.3] Out: 400 [Urine:400]  General: NAD Pulmonary: no increased work of breathing Abdomen:  non-distended, non-tender, fundus firm at level of umbilicus Inc: Clean/dry/intact Extremities: no edema, no erythema, no tenderness  Labs:   Recent Labs Lab 04/17/17 1749 04/21/17 0821  WBC 8.0 9.7  HGB 10.9* 9.1*  HCT 33.1* 27.7*  PLT 217 203     Assessment:   26 y.o. G4P4003 postoperative day # 1, s/p repeat cesarean section, doing well  Plan:  1) Acute blood loss anemia - hemodynamically stable and asymptomatic - po ferrous sulfate  2) A POS / Rubella Immune/  Varicella Immune   3) TDAP status not received. States that she is willing to accept prior to discharge Flu vaccine - has not had, but will accept  4) Contraception = plans BTL. Discussed having 30-day papers at 1-week incision check so that she can get an interval BTL at 6 weeks  5) Disposition - home postop day #2-3  6) social issues: Social work consult.  Conard Novak, MD, FACOG 04/21/2017 10:52 AM

## 2017-04-22 DIAGNOSIS — O9081 Anemia of the puerperium: Secondary | ICD-10-CM

## 2017-04-22 DIAGNOSIS — D62 Acute posthemorrhagic anemia: Secondary | ICD-10-CM

## 2017-04-22 DIAGNOSIS — F1990 Other psychoactive substance use, unspecified, uncomplicated: Secondary | ICD-10-CM

## 2017-04-22 DIAGNOSIS — O99324 Drug use complicating childbirth: Secondary | ICD-10-CM

## 2017-04-22 MED ORDER — BISACODYL 10 MG RE SUPP: 10.0000 mg | Freq: Every day | RECTAL | Status: DC | PRN

## 2017-04-22 MED ORDER — FERROUS FUMARATE 324 (106 FE) MG PO TABS
1.0000 | ORAL_TABLET | Freq: Every day | ORAL | Status: DC
  Administered 2017-04-22 – 2017-04-23 (×2): 106 mg via ORAL
  Filled 2017-04-22 (×2): qty 1

## 2017-04-22 NOTE — Progress Notes (Signed)
POD #2 s/p Cesarean section Subjective:   Tolerating a regular diet. Has not started passing flatus yet. No problems voiding. Baby bottle feeding  Objective:  Blood pressure 108/66, pulse 85, temperature 98.3 F (36.8 C), temperature source Axillary, resp. rate 20, height  (1.499 m), weight 70.8 kg (156 lb), last menstrual period 07/27/2016, SpO2 98 %, unknown if currently breastfeeding.  General: NAD Heart: RRR without murmur Pulmonary: no increased work of breathing/ CTAB Abdomen: non-distended, non-tender, bowel sounds present Incision: C+D+I, some blood on dressing due to blood from vaginal bleeding; ON Q intact Extremities: no edema, no erythema, no tenderness  Results for orders placed or performed during the hospital encounter of 04/20/17 (from the past 72 hour(s))  Urine Drug Screen, Qualitative ([facility] only)     Status: Abnormal   Collection Time: 04/20/17 10:26 AM  Result Value Ref Range   Tricyclic, Ur Screen NONE DETECTED NONE DETECTED   Amphetamines, Ur Screen NONE DETECTED NONE DETECTED   MDMA (Ecstasy)Ur Screen NONE DETECTED NONE DETECTED   Cocaine Metabolite,Ur [place] NONE DETECTED NONE DETECTED   Opiate, Ur Screen NONE DETECTED NONE DETECTED   Phencyclidine (PCP) Ur S NONE DETECTED NONE DETECTED   Cannabinoid 50 Ng, Ur [place] POSITIVE (A) NONE DETECTED   Barbiturates, Ur Screen NONE DETECTED NONE DETECTED   Benzodiazepine, Ur Scrn NONE DETECTED NONE DETECTED   Methadone Scn, Ur NONE DETECTED NONE DETECTED    Comment: (NOTE) 100  Tricyclics, urine               Cutoff 1000 ng/mL 200  Amphetamines, urine             Cutoff 1000 ng/mL 300  MDMA (Ecstasy), urine           Cutoff 500 ng/mL 400  Cocaine Metabolite, urine       Cutoff 300 ng/mL 500  Opiate, urine                   Cutoff 300 ng/mL 600  Phencyclidine (PCP), urine      Cutoff 25 ng/mL 700  Cannabinoid, urine              Cutoff 50 ng/mL 800  Barbiturates, urine             Cutoff 200 ng/mL 900   Benzodiazepine, urine           Cutoff 200 ng/mL 1000 Methadone, urine                Cutoff 300 ng/mL 1100 1200 The urine drug screen provides only a preliminary, unconfirmed 1300 analytical test result and should not be used for non-medical 1400 purposes. Clinical consideration and professional judgment should 1500 be applied to any positive drug screen result due to possible 1600 interfering substances. A more specific alternate chemical method 1700 must be used in order to obtain a confirmed analytical result.  1800 Gas chromato graphy / mass spectrometry (GC/MS) is the preferred 1900 confirmatory method.   Type and screen [facility]     Status: None   Collection Time: 04/20/17 10:26 AM  Result Value Ref Range   ABO/RH(D) A POS    Antibody Screen NEG    Sample Expiration 04/23/2017   CBC     Status: Abnormal   Collection Time: 04/21/17  8:21 AM  Result Value Ref Range   WBC 9.7 3.6 - 11.0 K/uL   RBC 3.55 (L) 3.80 - 5.20 MIL/uL   Hemoglobin 9.1 (  L) 12.0 - 16.0 g/dL   HCT 16.1 (L) 09.6 - 04.5 %   MCV 78.2 (L) 80.0 - 100.0 fL   MCH 25.7 (L) 26.0 - 34.0 pg   MCHC 32.8 32.0 - 36.0 g/dL   RDW 40.9 (H) 81.1 - 91.4 %   Platelets 203 150 - 440 K/uL     Assessment:   26 y.o. [id number] postoperativeday # 2-stable Slow return of bowel function  Ambulate  Dulcolax suppository if needed   Plan:  1) Acute blood loss anemia - hemodynamically stable and asymptomatic - po iron and vitamins   2) A POS / Rubella Immune/  Varicella Immune   3) TDAP status not received. States that she is willing to accept prior to discharge Flu vaccine - has not had, but will accept  4) Bottle/Contraception = plans BTL. Discussed having 30-day papers at 1-week incision check so that she can get an interval BTL at 6 weeks  5) Disposition - home postop day #3  6) social issues: Social work consult.pending  Farrel Conners, CNM

## 2017-04-23 ENCOUNTER — Ambulatory Visit: Payer: Self-pay

## 2017-04-23 MED ORDER — INFLUENZA VAC SPLIT QUAD 0.5 ML IM SUSY: 0.5000 mL | PREFILLED_SYRINGE | INTRAMUSCULAR | Status: DC | PRN

## 2017-04-23 MED ORDER — OXYCODONE HCL 5 MG PO TABS: 5.0000 mg | ORAL_TABLET | ORAL | 0 refills | Status: DC | PRN

## 2017-04-23 NOTE — Progress Notes (Signed)
Reviewed all patients discharge instructions and handouts regarding postpartum bleeding, no intercourse for 6 weeks, signs and symptoms of mastitis and postpartum bleu's.Provided patient with proper incision care and antiseptic spray. Provided patient with written and verbal instructions on the care and proper removal of on-q pump. Reviewed discharge instructions for newborn regarding proper cord care, how and when to bathe the newborn, nail care, proper way to take the baby's temperature, along with safe sleep. All questions have been answered at this time. Patient discharged via wheelchair with axillary.

## 2017-04-23 NOTE — Discharge Summary (Signed)
OB Discharge Summary     Patient Name: Tara Raymond DOB: [birth year] MRN: [id number]  Date of admission: 04/20/2017 Delivering MD: Tresea Mall, CNM  Date of Delivery: 04/20/2017  Date of discharge: 04/23/2017  Admitting diagnosis: PRIOR CSECTION, short interval pregnancy, no prenatal care, substance abuse Intrauterine pregnancy: [redacted]w[redacted]d     Secondary diagnosis: postpartum cesarean section     Discharge diagnosis: Term Pregnancy Delivered                                                                                                Post partum procedures: none  Augmentation: none  Complications: None  Hospital course:  Sceduled C/S   26 y.o. yo G4P4003 at [redacted]w[redacted]d was admitted to the hospital 04/20/2017 for scheduled cesarean section with the following indication:Prior Uterine Surgery.  Membrane Rupture Time/Date:   ,    Patient delivered a Viable infant.04/20/2017  Details of operation can be found in separate operative note.  Pateint had an uncomplicated postpartum course.  She is ambulating, tolerating a regular diet, passing flatus, and urinating well. Patient is discharged home in stable condition on  04/23/17         Physical exam  Vitals:   04/22/17 1713 04/22/17 2014 04/23/17 0430 04/23/17 0741  BP:  (!) 138/94  118/75  Pulse:  76  74  Resp:  20  18  Temp: 98.3 F (36.8 C) 97.9 F (36.6 C) 99.3 F (37.4 C) 98.6 F (37 C)  TempSrc: Oral Oral Oral Oral  SpO2:    99%  Weight:      Height:       General: alert, cooperative and no distress Lochia: appropriate Uterine Fundus: firm Incision: Healing well with no significant drainage, Dressing is clean, dry, and intact DVT Evaluation: No evidence of DVT seen on physical exam.  Labs: Lab Results  Component Value Date   WBC 9.7 04/21/2017   HGB 9.1 (L) 04/21/2017   HCT 27.7 (L) 04/21/2017   MCV 78.2 (L) 04/21/2017   PLT 203 04/21/2017    Discharge instruction: per After Visit Summary.  Medications:  Allergies  as of 04/23/2017      Reactions   Naproxen Hives      Medication List    STOP taking these medications   oxyCODONE-acetaminophen 5-325 MG tablet Commonly known as:  PERCOCET/ROXICET     TAKE these medications   acetaminophen 500 MG tablet Commonly known as:  TYLENOL Take 500-1,000 mg by mouth every 6 (six) hours as needed (for pain.).   oxyCODONE 5 MG immediate release tablet Commonly known as:  Oxy IR/ROXICODONE Take 1 tablet (5 mg total) by mouth every 4 (four) hours as needed for severe pain.   Prenatal Vitamins 0.8 MG tablet Take 1 tablet by mouth daily. What changed:  when to take this            Discharge Care Instructions        Start     Ordered   04/23/17 0000  Discharge wound care:    Comments:  Keep incision dry, clean.   04/23/17 1012  Diet: routine diet  Activity: Advance as tolerated. Pelvic rest for 6 weeks.   Outpatient follow up: Follow-up Information    Vena Austria, MD Follow up in 1 week(s).   Specialty:  Obstetrics and Gynecology Why:  For wound re-check Contact information: [address] [place] [address] [phone]             Postpartum contraception: postpartum bilateral tubal ligation Rhogam Given postpartum: NA Rubella vaccine given postpartum: Rubella Immune Varicella vaccine given postpartum: Varicella Immune TDaP given antepartum or postpartum: postpartum  Newborn Data: Live born female  Birth Weight: 9 lb 0.6 oz (4100 g) APGAR: 8, 9  Newborn Delivery   Birth date/time:  04/20/2017 12:04:00 Delivery type:  C-Section, Low Vertical  C-section categorization:  Repeat      Baby Feeding: Bottle  Disposition:home with mother  SIGNED:  Tresea Mall, CNM 04/23/2017 10:14 AM

## 2017-04-23 NOTE — Clinical Social Work Maternal (Signed)
  CLINICAL SOCIAL WORK MATERNAL/CHILD NOTE  Patient Details  Name: Tara Raymond MRN: [id number] Date of Birth: 03-29-1991  Date:  04/23/2017  Clinical Social Worker Initiating Note:  York Spaniel MSW,LCSW Date/Time: Initiated:  04/23/17/      Child's Name:      Biological Parents:  Mother   Need for Interpreter:  None   Reason for Referral:   (MJ +)   Address:  [address] [place] [address]    Phone number:  [phone] (home)     Additional phone number: none  Household Members/Support Persons (HM/SP):       HM/SP Name Relationship DOB or Age  HM/SP -1        HM/SP -2        HM/SP -3        HM/SP -4        HM/SP -5        HM/SP -6        HM/SP -7        HM/SP -8          Natural Supports (not living in the home):  Friends, Extended Family   Professional Supports:     Employment: Unemployed   Type of Work:     Education:      Homebound arranged:    Surveyor, quantity Resources:  Self-Pay    Other Resources:  Sales executive , WIC   Cultural/Religious Considerations Which May Impact Care:  none  Strengths:  Ability to meet basic needs , Compliance with medical plan , Home prepared for child , Understanding of illness   Psychotropic Medications:         Pediatrician:       Pediatrician List:   Radiographer, therapeutic    [place]      Pediatrician Fax Number:    Risk Factors/Current Problems:  Substance Use    Cognitive State:  Alert , Able to Concentrate , Goal Oriented    Mood/Affect:  Bright , Calm    CSW Assessment: CSW spoke with patient this morning regarding marijuana positive urine drug screen of her and her newborn. CSW is familiar with patient from previous hospitalizations. On this admission, patient is bright and energetic. She is appropriate and has stable housing. She states she is no longer using cocaine or herione. She states she became "clean" on her own  and did not enter any drug rehab facilities or see any mental health providers. She states that she is on no medication for depression. Patient's mother who is in the room with her this morning confirms this information. Patient states she will be going home to live with her aunt. She has all supplies and necessities for her newborn. She has transportation. Patient reports she still does not have custody of her other children but sees them on a regular basis. She reports using marijuana to increase her appetite. She is aware that CSW will make a DSS CPS report. CSW provided positive feedback regarding patient's improvement since last hospitalization and delivery.   CSW Plan/Description:  Child Protective Service Report     York Spaniel, LCSW 04/23/2017, 10:28 AM

## 2017-04-23 NOTE — Lactation Note (Signed)
This note was copied from a baby's chart. Lactation Consultation Note  Patient Name: Tara Raymond [id number] Date: 04/23/2017   Mom's feeding choice is bottlefeeding formula.  Mom is engorged with hard, warm, painful breasts.  Demonstrated use of cabbage leaves on breasts to decrease swelling.  Breasts slightly softer and much more comfortable as observed and mom voices after cabbage leaves treatment.  Plan of care to treat engorgement discussed and given to mom.  Maternal Data    Feeding Feeding Type: Bottle Fed - Formula Nipple Type: Regular  LATCH Score                   Interventions    Lactation Tools Discussed/Used     Consult Status      Louis Meckel 04/23/2017, 1:16 PM

## 2017-04-30 ENCOUNTER — Encounter: Payer: Self-pay | Admitting: Emergency Medicine

## 2017-04-30 ENCOUNTER — Emergency Department: Payer: Medicaid Other

## 2017-04-30 ENCOUNTER — Ambulatory Visit: Payer: Self-pay | Admitting: Obstetrics and Gynecology

## 2017-04-30 ENCOUNTER — Emergency Department
Admission: EM | Admit: 2017-04-30 | Discharge: 2017-04-30 | Disposition: A | Discharge status: Home / Self Care | Payer: Medicaid Other | Attending: Emergency Medicine | Admitting: Emergency Medicine

## 2017-04-30 DIAGNOSIS — T148XXA Other injury of unspecified body region, initial encounter: Secondary | ICD-10-CM

## 2017-04-30 DIAGNOSIS — L03818 Cellulitis of other sites: Secondary | ICD-10-CM

## 2017-04-30 DIAGNOSIS — Z87891 Personal history of nicotine dependence: Secondary | ICD-10-CM | POA: Diagnosis not present

## 2017-04-30 DIAGNOSIS — O902 Hematoma of obstetric wound: Secondary | ICD-10-CM | POA: Diagnosis present

## 2017-04-30 DIAGNOSIS — L03311 Cellulitis of abdominal wall: Secondary | ICD-10-CM | POA: Diagnosis not present

## 2017-04-30 DIAGNOSIS — G8918 Other acute postprocedural pain: Secondary | ICD-10-CM

## 2017-04-30 DIAGNOSIS — N3090 Cystitis, unspecified without hematuria: Secondary | ICD-10-CM | POA: Diagnosis not present

## 2017-04-30 LAB — BASIC METABOLIC PANEL
ANION GAP: 11 (ref 5–15)
BUN: 15 mg/dL (ref 6–20)
CO2: 23 mmol/L (ref 22–32)
Calcium: 8.9 mg/dL (ref 8.9–10.3)
Chloride: 101 mmol/L (ref 101–111)
Creatinine, Ser: 0.48 mg/dL (ref 0.44–1.00)
GFR calc Af Amer: >60 mL/min (ref >60)
Glucose, Bld: 79 mg/dL (ref 65–99)
POTASSIUM: 4.2 mmol/L (ref 3.5–5.1)
SODIUM: 135 mmol/L (ref 135–145)

## 2017-04-30 LAB — CBC
HCT: 34 % — ABNORMAL LOW (ref 35.0–47.0)
Hemoglobin: 11.1 g/dL — ABNORMAL LOW (ref 12.0–16.0)
MCH: 26.3 pg (ref 26.0–34.0)
MCHC: 32.8 g/dL (ref 32.0–36.0)
MCV: 80.1 fL (ref 80.0–100.0)
PLATELETS: 420 10*3/uL (ref 150–440)
RBC: 4.24 MIL/uL (ref 3.80–5.20)
RDW: 17.9 % — AB (ref 11.5–14.5)
WBC: 6.4 10*3/uL (ref 3.6–11.0)

## 2017-04-30 LAB — URINALYSIS, COMPLETE (UACMP) WITH MICROSCOPIC
Bilirubin Urine: NEGATIVE
GLUCOSE, UA: NEGATIVE mg/dL
KETONES UR: NEGATIVE mg/dL
Nitrite: POSITIVE — AB
PH: 6 (ref 5.0–8.0)
Protein, ur: 30 mg/dL — AB
SPECIFIC GRAVITY, URINE: 1.019 (ref 1.005–1.030)

## 2017-04-30 MED ORDER — HYDROMORPHONE HCL 1 MG/ML IJ SOLN
0.5000 mg | Freq: Once | INTRAMUSCULAR | Status: AC
  Administered 2017-04-30: 0.5 mg via INTRAVENOUS
  Filled 2017-04-30: qty 1

## 2017-04-30 MED ORDER — PIPERACILLIN-TAZOBACTAM 3.375 G IVPB 30 MIN
3.3750 g | Freq: Once | INTRAVENOUS | Status: DC
  Filled 2017-04-30 (×2): qty 50

## 2017-04-30 MED ORDER — SULFAMETHOXAZOLE-TRIMETHOPRIM 800-160 MG PO TABS: 1.0000 | ORAL_TABLET | Freq: Two times a day (BID) | ORAL | 0 refills | Status: DC

## 2017-04-30 MED ORDER — OXYCODONE-ACETAMINOPHEN 5-325 MG PO TABS: 1.0000 | ORAL_TABLET | Freq: Three times a day (TID) | ORAL | 0 refills | Status: DC | PRN

## 2017-04-30 MED ORDER — VANCOMYCIN HCL IN DEXTROSE 1-5 GM/200ML-% IV SOLN
1000.0000 mg | Freq: Once | INTRAVENOUS | Status: AC
  Administered 2017-04-30: 1000 mg via INTRAVENOUS
  Filled 2017-04-30: qty 200

## 2017-04-30 MED ORDER — CIPROFLOXACIN HCL 500 MG PO TABS
500.0000 mg | ORAL_TABLET | Freq: Once | ORAL | Status: AC
  Administered 2017-04-30: 500 mg via ORAL
  Filled 2017-04-30: qty 1

## 2017-04-30 MED ORDER — IOPAMIDOL (ISOVUE-300) INJECTION 61%
75.0000 mL | Freq: Once | INTRAVENOUS | Status: AC | PRN
  Administered 2017-04-30: 75 mL via INTRAVENOUS
  Filled 2017-04-30: qty 75

## 2017-04-30 NOTE — ED Triage Notes (Addendum)
Pt reports had c-section 10 days ago. Pt reports increased pain, swelling and redness at surgical incision. Pt reports the pain was absent at discharge but has increased over the past two days. Pt denies fever at home, denies NVD. Pt ambulatory to triage. No apparent distress noted.

## 2017-04-30 NOTE — ED Provider Notes (Addendum)
[facility] Emergency Department Provider Note       Time seen: ----------------------------------------- 2:27 PM on 04/30/2017 -----------------------------------------     I have reviewed the triage vital signs and the nursing notes.   HISTORY   Chief Complaint Post-op Problem    HPI Tara Raymond is a 26 y.o. female with a history of recent C-section 10 days ago according to her chart. She presents to the ED for increased pain, swelling and redness to the surgical incision site. Patient reports pain was absent at discharge present increased over the past 2 days. She also is complaining of some toothache. She arrives ambulatory complaining of pain out of 10 in the lower abdomen. She still is having vaginal bleeding.  Past Medical History:  Diagnosis Date  . Anemia   . Dyspnea    with pregnancy  . Mental disorder     Patient Active Problem List   Diagnosis Date Noted  . Encounter for maternal care for low transverse scar from previous cesarean delivery 04/20/2017  . Indication for care in labor or delivery 04/17/2017  . Anemia associated with acute blood loss 02/13/2016  . Postpartum care following cesarean delivery 02/12/2016  . Major depression, single episode 02/12/2016  . Postoperative anemia due to acute blood loss 02/12/2016  . Adjustment disorder with depressed mood 02/11/2016  . Substance abuse affecting pregnancy in third trimester, antepartum 02/09/2016  . Labor and delivery, indication for care 02/03/2016  . Substance induced mood disorder (HCC) 01/28/2016  . Severe major depression, single episode, without psychotic features (HCC) 01/28/2016  . Cocaine use disorder, moderate, dependence (HCC) 01/28/2016  . Opioid use disorder, moderate, dependence (HCC) 01/28/2016  . Opiate withdrawal (HCC) 01/28/2016    Past Surgical History:  Procedure Laterality Date  . CESAREAN SECTION N/A 02/09/2016   Procedure: CESAREAN SECTION;   Surgeon: Nadara Mustardobert P Harris, MD;  Location: [facility] ORS;  Service: Obstetrics;  Laterality: N/A;  . CESAREAN SECTION N/A 04/20/2017   Procedure: CESAREAN SECTION;  Surgeon: Vena AustriaStaebler, Andreas, MD;  Location: [facility] ORS;  Service: Obstetrics;  Laterality: N/A;    Allergies Naproxen  Social History Social History  Substance Use Topics  . Smoking status: Former Smoker    Packs/day: 0.25    Types: Cigarettes  . Smokeless tobacco: Current User  . Alcohol use No    Review of Systems Constitutional: Negative for fever. Cardiovascular: Negative for chest pain. Respiratory: Negative for shortness of breath. Gastrointestinal: positive for abdominal pain Genitourinary: Negative for dysuria. Musculoskeletal: Negative for back pain. Skin: positive for redness and swelling at the incision site Neurological: Negative for headaches, focal weakness or numbness.  All systems negative/normal/unremarkable except as stated in the HPI  ____________________________________________   PHYSICAL EXAM:  VITAL SIGNS: ED Triage Vitals  Enc Vitals Group     BP 04/30/17 1318 110/77     Pulse Rate 04/30/17 1318 92     Resp 04/30/17 1318 18     Temp 04/30/17 1318 98.5 F (36.9 C)     Temp Source 04/30/17 1318 Oral     SpO2 04/30/17 1318 100 %     Weight 04/30/17 1319 126 lb (57.2 kg)     Height 04/30/17 1319 4\' 11"  (1.499 m)     Head Circumference --      Peak Flow --      Pain Score 04/30/17 1319 9     Pain Loc --      Pain Edu? --      Excl.  in GC? --    Constitutional: Alert and oriented. Well appearing and in no distress. Eyes: Conjunctivae are normal. Normal extraocular movements. ENT   Head: Normocephalic and atraumatic.   Nose: No congestion/rhinnorhea.   Mouth/Throat: Mucous membranes are moist.   Neck: No stridor. Cardiovascular: Normal rate, regular rhythm. No murmurs, rubs, or gallops. Respiratory: Normal respiratory effort without tachypnea nor retractions. Breath sounds are  clear and equal bilaterally. No wheezes/rales/rhonchi. Gastrointestinal: lower abdominal tenderness and tenderness around the low transverse cesarean incision Musculoskeletal: Nontender with normal range of motion in extremities. No lower extremity tenderness nor edema. Neurologic:  Normal speech and language. No gross focal neurologic deficits are appreciated.  Skin:  mild erythema with some induration and tenderness along the entire course of her incision site Psychiatric: Mood and affect are normal. Speech and behavior are normal.  ___________________________________________  ED COURSE:  Pertinent labs & imaging results that were available during my care of the patient were reviewed by me and considered in my medical decision making (see chart for details). Patient presents for abdominal pain after recent C-section, we will assess with labs and imaging as indicated. Patient presents likely with wound infection   Procedures ____________________________________________   LABS (pertinent positives/negatives)  Labs Reviewed  CBC - Abnormal; Notable for the following:       Result Value   Hemoglobin 11.1 (*)    HCT 34.0 (*)    RDW 17.9 (*)    All other components within normal limits  URINALYSIS, COMPLETE (UACMP) WITH MICROSCOPIC - Abnormal; Notable for the following:    Color, Urine YELLOW (*)    APPearance CLOUDY (*)    Hgb urine dipstick LARGE (*)    Protein, ur 30 (*)    Nitrite POSITIVE (*)    Leukocytes, UA MODERATE (*)    Bacteria, UA MANY (*)    Squamous Epithelial / LPF 6-30 (*)    All other components within normal limits  BASIC METABOLIC PANEL    RADIOLOGY  CT of abdomen and pelvis with contrast IMPRESSION: Somewhat complex fluid collection in the subcutaneous tissues of the anterior abdominal wall consistent with the recent surgery. This likely represents a postoperative hematoma. The possibility of superinfection cannot be totally excluded although no air is  noted within. Decompression may be helpful. No active extravasation is identified.  Bulky uterus consistent with postpartum state.  No other focal abnormality is noted. ____________________________________________  DIFFERENTIAL DIAGNOSIS   cellulitis, abscess, peritonitis, endometritis, retained products of conception   FINAL ASSESSMENT AND PLAN  postoperative hematoma, cellulitis, cystitis   Plan: Patient had presented for pain, redness and swelling at her previous C-section incision site. Patients labs reassuring although her urine does indicate some degree of infection. Patients imaging revealed what looks to be hematoma although the possibility of infection can't be excluded. We did give her vancomycin and Zosyn here and she'll be discharged with Bactrim to cover both urine and skin infection. She will follow-up with OB/GYN on Monday.   Emily Filbert, MD   Note: This note was generated in part or whole with voice recognition software. Voice recognition is usually quite accurate but there are transcription errors that can and very often do occur. I apologize for any typographical errors that were not detected and corrected.     Emily Filbert, MD 04/30/17 1545    Emily Filbert, MD 04/30/17 [phone]

## 2017-04-30 NOTE — ED Notes (Signed)
First nurse note.  Pt to ed with c/o recent c section 1 week ago. Pt reports pain in lower abd now with swelling and states abd feels hard to touch.  Pt has not contacted OBGYN Dr Bonney Aidstaebler.

## 2017-05-05 ENCOUNTER — Encounter: Payer: Self-pay | Admitting: Intensive Care

## 2017-05-05 ENCOUNTER — Emergency Department
Admission: EM | Admit: 2017-05-05 | Discharge: 2017-05-05 | Discharge status: Against Medical Advice | Payer: Medicaid Other | Attending: Emergency Medicine | Admitting: Emergency Medicine

## 2017-05-05 DIAGNOSIS — G8918 Other acute postprocedural pain: Secondary | ICD-10-CM | POA: Insufficient documentation

## 2017-05-05 DIAGNOSIS — F99 Mental disorder, not otherwise specified: Secondary | ICD-10-CM | POA: Diagnosis not present

## 2017-05-05 DIAGNOSIS — R109 Unspecified abdominal pain: Secondary | ICD-10-CM | POA: Diagnosis present

## 2017-05-05 DIAGNOSIS — F17220 Nicotine dependence, chewing tobacco, uncomplicated: Secondary | ICD-10-CM | POA: Insufficient documentation

## 2017-05-05 DIAGNOSIS — Z5320 Procedure and treatment not carried out because of patient's decision for unspecified reasons: Secondary | ICD-10-CM | POA: Diagnosis not present

## 2017-05-05 HISTORY — DX: Other psychoactive substance use, unspecified with psychoactive substance-induced mood disorder: F19.94

## 2017-05-05 HISTORY — DX: Cocaine abuse, uncomplicated: F14.10

## 2017-05-05 HISTORY — DX: Opioid dependence, uncomplicated: F11.20

## 2017-05-05 LAB — CBC
HEMATOCRIT: 36.9 % (ref 35.0–47.0)
HEMOGLOBIN: 11.7 g/dL — AB (ref 12.0–16.0)
MCH: 25.4 pg — AB (ref 26.0–34.0)
MCHC: 31.6 g/dL — AB (ref 32.0–36.0)
MCV: 80.4 fL (ref 80.0–100.0)
Platelets: 414 10*3/uL (ref 150–440)
RBC: 4.59 MIL/uL (ref 3.80–5.20)
RDW: 18.2 % — ABNORMAL HIGH (ref 11.5–14.5)
WBC: 5.8 10*3/uL (ref 3.6–11.0)

## 2017-05-05 LAB — URINALYSIS, COMPLETE (UACMP) WITH MICROSCOPIC
BACTERIA UA: NONE SEEN
BILIRUBIN URINE: NEGATIVE
Glucose, UA: NEGATIVE mg/dL
Ketones, ur: NEGATIVE mg/dL
NITRITE: NEGATIVE
PROTEIN: NEGATIVE mg/dL
SPECIFIC GRAVITY, URINE: 1.012 (ref 1.005–1.030)
pH: 6 (ref 5.0–8.0)

## 2017-05-05 LAB — COMPREHENSIVE METABOLIC PANEL
ALBUMIN: 3.7 g/dL (ref 3.5–5.0)
ALT: 41 U/L (ref 14–54)
ANION GAP: 11 (ref 5–15)
AST: 55 U/L — ABNORMAL HIGH (ref 15–41)
Alkaline Phosphatase: 104 U/L (ref 38–126)
BUN: 15 mg/dL (ref 6–20)
CHLORIDE: 102 mmol/L (ref 101–111)
CO2: 22 mmol/L (ref 22–32)
Calcium: 9.1 mg/dL (ref 8.9–10.3)
Creatinine, Ser: 0.56 mg/dL (ref 0.44–1.00)
GFR calc Af Amer: >60 mL/min (ref >60)
GFR calc non Af Amer: >60 mL/min (ref >60)
GLUCOSE: 109 mg/dL — AB (ref 65–99)
POTASSIUM: 3.9 mmol/L (ref 3.5–5.1)
SODIUM: 135 mmol/L (ref 135–145)
Total Bilirubin: 0.5 mg/dL (ref 0.3–1.2)
Total Protein: 8.2 g/dL — ABNORMAL HIGH (ref 6.5–8.1)

## 2017-05-05 LAB — POCT PREGNANCY, URINE: PREG TEST UR: NEGATIVE

## 2017-05-05 LAB — LIPASE, BLOOD: LIPASE: 24 U/L (ref 11–51)

## 2017-05-05 MED ORDER — KETOROLAC TROMETHAMINE 30 MG/ML IJ SOLN
15.0000 mg | Freq: Once | INTRAMUSCULAR | Status: DC
  Filled 2017-05-05: qty 1

## 2017-05-05 NOTE — ED Triage Notes (Signed)
Patient had a cesarean Oct 9th and was lifting furniture yesterday and is now experiencing extreme pain at incision. OBGYN appointment is scheduled for Friday but came in today due to extreme pain. Reports area has hard knot and and drainage. Ambulatory in triage with no distress

## 2017-05-05 NOTE — ED Provider Notes (Signed)
[facility] Emergency Department Provider Note  ____________________________________________   First MD Initiated Contact with Patient 05/05/17 1528     (approximate)  I have reviewed the triage vital signs and the nursing notes.   HISTORY  Chief Complaint Abdominal Pain    HPI Tara Raymond is a 26 y.o. female with medical history as listed below who presents for evaluation of acute pain in her cesarean section wound.  She delivered her baby about 2 weeks ago presented back to the emergency department about a week ago with additional pain at the site.  She was treated empirically for a possible wound infection and given a prescription for narcotics (she also was given 1 at the time of discharge after her baby was born).  She presents today because she says that she was helping her mom move some furniture because they have to move out of their home and after lifting a dresser she felt acute onset of sharp pain in her cesarean scar.  She says that it was bleeding a little bit although it is stopped now.  Movement makes it worse and holding still makes a little bit better.  Denies fever/chills, chest pain, shortness of breath, nausea, vomiting, and she says the pain is very severe.  Past Medical History:  Diagnosis Date  . Anemia   . Cocaine use disorder (HCC)   . Dyspnea    with pregnancy  . Mental disorder   . Opioid use disorder, moderate, dependence (HCC)   . Substance induced mood disorder [facility])     Patient Active Problem List   Diagnosis Date Noted  . Encounter for maternal care for low transverse scar from previous cesarean delivery 04/20/2017  . Indication for care in labor or delivery 04/17/2017  . Anemia associated with acute blood loss 02/13/2016  . Postpartum care following cesarean delivery 02/12/2016  . Major depression, single episode 02/12/2016  . Postoperative anemia due to acute blood loss 02/12/2016  . Adjustment disorder with  depressed mood 02/11/2016  . Substance abuse affecting pregnancy in third trimester, antepartum 02/09/2016  . Labor and delivery, indication for care 02/03/2016  . Substance induced mood disorder (HCC) 01/28/2016  . Severe major depression, single episode, without psychotic features (HCC) 01/28/2016  . Cocaine use disorder, moderate, dependence (HCC) 01/28/2016  . Opioid use disorder, moderate, dependence (HCC) 01/28/2016  . Opiate withdrawal (HCC) 01/28/2016    Past Surgical History:  Procedure Laterality Date  . CESAREAN SECTION N/A 02/09/2016   Procedure: CESAREAN SECTION;  Surgeon: Nadara Mustard, MD;  Location: [facility] ORS;  Service: Obstetrics;  Laterality: N/A;  . CESAREAN SECTION N/A 04/20/2017   Procedure: CESAREAN SECTION;  Surgeon: Vena Austria, MD;  Location: [facility] ORS;  Service: Obstetrics;  Laterality: N/A;    Prior to Admission medications   Medication Sig Start Date End Date Taking? Authorizing Provider  acetaminophen (TYLENOL) 500 MG tablet Take 500-1,000 mg by mouth every 6 (six) hours as needed (for pain.).    [provider]  oxyCODONE (OXY IR/ROXICODONE) 5 MG immediate release tablet Take 1 tablet (5 mg total) by mouth every 4 (four) hours as needed for severe pain. 04/23/17   Tresea Mall, CNM  oxyCODONE-acetaminophen (PERCOCET) 5-325 MG tablet Take 1-2 tablets by mouth every 8 (eight) hours as needed. 04/30/17   Emily Filbert, MD  Prenatal Multivit-Min-Fe-FA (PRENATAL VITAMINS) 0.8 MG tablet Take 1 tablet by mouth daily. Patient taking differently: Take 1 tablet by mouth at bedtime.  03/19/17  Bridget HartshornSummers, Rhonda L, PA-C  sulfamethoxazole-trimethoprim (BACTRIM DS) 800-160 MG tablet Take 1 tablet by mouth 2 (two) times daily. 04/30/17   Emily FilbertWilliams, Jonathan E, MD    Allergies Naproxen  History reviewed. No pertinent family history.  Social History Social History  Substance Use Topics  . Smoking status: Former Smoker    Packs/day: 0.25    Types:  Cigarettes  . Smokeless tobacco: Current User  . Alcohol use No    Review of Systems Constitutional: No fever/chills Eyes: No visual changes. ENT: No sore throat. Cardiovascular: Denies chest pain. Respiratory: Denies shortness of breath. Gastrointestinal: Lower abdominal pain at the site of cesarean section wound/scar.  No nausea/vomiting/diarrhea Genitourinary: Negative for dysuria. Musculoskeletal: Negative for neck pain.  Negative for back pain. Integumentary: Negative for rash. Neurological: Negative for headaches, focal weakness or numbness.   ____________________________________________   PHYSICAL EXAM:  VITAL SIGNS: ED Triage Vitals [05/05/17 1159]  Enc Vitals Group     BP 113/71     Pulse Rate 75     Resp      Temp 97.7 F (36.5 C)     Temp Source Oral     SpO2 97 %     Weight 57.2 kg (126 lb)     Height 1.499 m (4\' 11" )     Head Circumference      Peak Flow      Pain Score 9     Pain Loc      Pain Edu?      Excl. in GC?     Constitutional: Alert and oriented. Well appearing and in no acute distress. Eyes: Conjunctivae are normal.  Cardiovascular: Normal rate, regular rhythm. Good peripheral circulation. Grossly normal heart sounds. Respiratory: Normal respiratory effort.  No retractions. Lungs CTAB. Gastrointestinal: Soft with mild tenderness around the cesarean scar.  There is no fluctuance or tenderness and no wound dehiscence.  There is no exudate or serosanguineous fluid coming from the wound.  It is healthy and well-appearing.  When I barely touched the wound the patient gasped and told me that it hurt very badly, but when I am not touching it she is moving around in the bed in no apparent discomfort. Genitourinary: Deferred Musculoskeletal: No lower extremity tenderness nor edema. No gross deformities of extremities. Neurologic:  Normal speech and language. No gross focal neurologic deficits are appreciated.  Skin:  Skin is warm, dry and intact. No  rash noted. Psychiatric: Mood and affect are normal. Speech and behavior are normal.  ____________________________________________   LABS (all labs ordered are listed, but only abnormal results are displayed)  Labs Reviewed  COMPREHENSIVE METABOLIC PANEL - Abnormal; Notable for the following:       Result Value   Glucose, Bld 109 (*)    Total Protein 8.2 (*)    AST 55 (*)    All other components within normal limits  CBC - Abnormal; Notable for the following:    Hemoglobin 11.7 (*)    MCH 25.4 (*)    MCHC 31.6 (*)    RDW 18.2 (*)    All other components within normal limits  URINALYSIS, COMPLETE (UACMP) WITH MICROSCOPIC - Abnormal; Notable for the following:    Color, Urine YELLOW (*)    APPearance HAZY (*)    Hgb urine dipstick MODERATE (*)    Leukocytes, UA MODERATE (*)    Squamous Epithelial / LPF 6-30 (*)    All other components within normal limits  URINE CULTURE  LIPASE, BLOOD  POCT PREGNANCY, URINE   ____________________________________________  EKG  None - EKG not ordered by ED physician ____________________________________________  RADIOLOGY   No results found.  ____________________________________________   PROCEDURES  Critical Care performed: No   Procedure(s) performed:   Procedures   ____________________________________________   INITIAL IMPRESSION / ASSESSMENT AND PLAN / ED COURSE  As part of my medical decision making, I reviewed the following data within the electronic MEDICAL RECORD NUMBER History obtained from family, Nursing notes reviewed and incorporated, Labs reviewed , Old chart reviewed, Notes from prior ED visits and [place] Controlled Substance Database    Differential diagnosis includes infection, wound dehiscence, internal injury secondary to straining including hemoperitoneum, postoperative abscess, postoperative hematoma, muscle strain,, and narcotic seeking behavior.  I am reassured by the patient's normal vital signs and  essentially normal lab work.  There were significant squamous epithelials in her urinalysis and I suspect that accounts for the white blood cells but I sent a urine culture so that someone can call in a prescription for antibiotics if needed.  She has no leukocytosis and her metabolic panel is normal.  Her physical exam is reassuring; the wound is closed and well-appearing with no fluctuance or inappropriate induration except for the very localized scar tissue.  I am reassured that the patient does not seem to have any significant abdominal tenderness as long as she is distracted and not reacting to the touch itself.  I explained to her that we could not prescribe any narcotics but I would be happy to give her an injection of a nonnarcotic pain medication.  She already has a follow-up appointment scheduled in a couple of days with her OB/GYN and I encouraged her to follow-up as planned.  She states that she understands and agrees.  I reviewed the patient's prescription history over the last 12 months in the multi-state controlled substances database(s) that includes [place], [place], [place], [place], [place], [place], [place], [place], [place], [place], [place], [place], [place], and [place].  Results were notable for the 2 prescriptions she was prescribed after the cesarean section and after her follow-up visit in the emergency department.   Clinical Course as of May 05 2141  Wed May 05, 2017  1539 Patient was observed ambulating out of the ED in no apparent discomfort without waiting for the Toradol injection.  [CF]    Clinical Course User Index [CF] Loleta Rose, MD    ____________________________________________  FINAL CLINICAL IMPRESSION(S) / ED DIAGNOSES  Final diagnoses:  Post-op pain     MEDICATIONS GIVEN DURING THIS VISIT:  Medications - No data to display   NEW OUTPATIENT MEDICATIONS STARTED DURING THIS VISIT:  Discharge Medication List  as of 05/05/2017  3:54 PM      Discharge Medication List as of 05/05/2017  3:54 PM      Discharge Medication List as of 05/05/2017  3:54 PM       Note:  This document was prepared using Dragon voice recognition software and may include unintentional dictation errors.    Loleta Rose, MD 05/05/17 2142

## 2017-05-05 NOTE — ED Notes (Signed)
Attempt to find patient unsuccessful. Patient cleared for discharge by MD. Not available to sign for discharge or obtain discharge vitals.

## 2017-05-05 NOTE — Discharge Instructions (Signed)
Please avoid all strenuous activity until your OB/GYN says that you can resume normal activities (at least a few weeks).  Use a heating pad as needed for comfort.  Use any remaining prescription pain medication you have, as well as over-the-counter pain medication, but as we explained, we cannot provide a new prescription for narcotic pain medication.  If you develop fever, persistent vomiting, worsening pain, or other symptoms that concern you, please return to the Emergency Department.

## 2017-05-05 NOTE — ED Notes (Signed)
[place], RN, went to give patient medication. Patient and family not in room and belonging gone. Will watch for patient return.

## 2017-05-05 NOTE — ED Notes (Signed)
Patient ambulatory to room with steady gait noted. NAD with ambulation.

## 2017-05-07 ENCOUNTER — Ambulatory Visit: Payer: Medicaid Other | Admitting: Obstetrics and Gynecology

## 2017-05-07 LAB — URINE CULTURE: Special Requests: NORMAL

## 2018-04-02 ENCOUNTER — Encounter: Payer: Self-pay | Admitting: Emergency Medicine

## 2018-04-02 ENCOUNTER — Observation Stay
Admission: EM | Admit: 2018-04-02 | Discharge: 2018-04-04 | Disposition: A | Discharge status: Home / Self Care | Payer: Self-pay | Attending: Internal Medicine | Admitting: Internal Medicine

## 2018-04-02 ENCOUNTER — Other Ambulatory Visit: Payer: Self-pay

## 2018-04-02 DIAGNOSIS — F1493 Cocaine use, unspecified with withdrawal: Secondary | ICD-10-CM

## 2018-04-02 DIAGNOSIS — F322 Major depressive disorder, single episode, severe without psychotic features: Secondary | ICD-10-CM | POA: Insufficient documentation

## 2018-04-02 DIAGNOSIS — F1420 Cocaine dependence, uncomplicated: Secondary | ICD-10-CM | POA: Insufficient documentation

## 2018-04-02 DIAGNOSIS — F17210 Nicotine dependence, cigarettes, uncomplicated: Secondary | ICD-10-CM | POA: Insufficient documentation

## 2018-04-02 DIAGNOSIS — F10232 Alcohol dependence with withdrawal with perceptual disturbance: Secondary | ICD-10-CM

## 2018-04-02 DIAGNOSIS — F1994 Other psychoactive substance use, unspecified with psychoactive substance-induced mood disorder: Secondary | ICD-10-CM | POA: Insufficient documentation

## 2018-04-02 DIAGNOSIS — F1123 Opioid dependence with withdrawal: Principal | ICD-10-CM | POA: Insufficient documentation

## 2018-04-02 DIAGNOSIS — Z79899 Other long term (current) drug therapy: Secondary | ICD-10-CM | POA: Insufficient documentation

## 2018-04-02 DIAGNOSIS — F10932 Alcohol use, unspecified with withdrawal with perceptual disturbance: Secondary | ICD-10-CM

## 2018-04-02 DIAGNOSIS — F1020 Alcohol dependence, uncomplicated: Secondary | ICD-10-CM | POA: Insufficient documentation

## 2018-04-02 DIAGNOSIS — N3000 Acute cystitis without hematuria: Secondary | ICD-10-CM | POA: Insufficient documentation

## 2018-04-02 DIAGNOSIS — F1193 Opioid use, unspecified with withdrawal: Secondary | ICD-10-CM | POA: Diagnosis present

## 2018-04-02 DIAGNOSIS — F1910 Other psychoactive substance abuse, uncomplicated: Secondary | ICD-10-CM | POA: Diagnosis present

## 2018-04-02 DIAGNOSIS — F1423 Cocaine dependence with withdrawal: Secondary | ICD-10-CM

## 2018-04-02 LAB — URINALYSIS, COMPLETE (UACMP) WITH MICROSCOPIC
Bilirubin Urine: NEGATIVE
Glucose, UA: NEGATIVE mg/dL
Hgb urine dipstick: NEGATIVE
KETONES UR: NEGATIVE mg/dL
Leukocytes, UA: NEGATIVE
Nitrite: NEGATIVE
PH: 5 (ref 5.0–8.0)
PROTEIN: NEGATIVE mg/dL
Specific Gravity, Urine: 1.028 (ref 1.005–1.030)

## 2018-04-02 LAB — URINE DRUG SCREEN, QUALITATIVE (ARMC ONLY)
Amphetamines, Ur Screen: NOT DETECTED
Barbiturates, Ur Screen: NOT DETECTED
Benzodiazepine, Ur Scrn: POSITIVE — AB
Cannabinoid 50 Ng, Ur ~~LOC~~: POSITIVE — AB
Cocaine Metabolite,Ur ~~LOC~~: POSITIVE — AB
MDMA (Ecstasy)Ur Screen: NOT DETECTED
Methadone Scn, Ur: POSITIVE — AB
Opiate, Ur Screen: POSITIVE — AB
Phencyclidine (PCP) Ur S: NOT DETECTED
Tricyclic, Ur Screen: NOT DETECTED

## 2018-04-02 LAB — CBC WITH DIFFERENTIAL/PLATELET
Basophils Absolute: 0.1 10*3/uL (ref 0–0.1)
Basophils Relative: 1 %
EOS ABS: 0.1 10*3/uL (ref 0–0.7)
EOS PCT: 1 %
HCT: 36.7 % (ref 35.0–47.0)
Hemoglobin: 12.5 g/dL (ref 12.0–16.0)
LYMPHS ABS: 4.1 10*3/uL — AB (ref 1.0–3.6)
LYMPHS PCT: 58 %
MCH: 29.8 pg (ref 26.0–34.0)
MCHC: 33.9 g/dL (ref 32.0–36.0)
MCV: 87.8 fL (ref 80.0–100.0)
MONOS PCT: 6 %
Monocytes Absolute: 0.5 10*3/uL (ref 0.2–0.9)
NEUTROS PCT: 34 %
Neutro Abs: 2.4 10*3/uL (ref 1.4–6.5)
PLATELETS: 258 10*3/uL (ref 150–440)
RBC: 4.19 MIL/uL (ref 3.80–5.20)
RDW: 14.6 % — ABNORMAL HIGH (ref 11.5–14.5)
WBC: 7.2 10*3/uL (ref 3.6–11.0)

## 2018-04-02 LAB — POCT PREGNANCY, URINE
Preg Test, Ur: NEGATIVE
Preg Test, Ur: NEGATIVE

## 2018-04-02 LAB — COMPREHENSIVE METABOLIC PANEL WITH GFR
ALT: 22 U/L (ref 0–44)
AST: 27 U/L (ref 15–41)
Albumin: 4.5 g/dL (ref 3.5–5.0)
Alkaline Phosphatase: 49 U/L (ref 38–126)
Anion gap: 5 (ref 5–15)
BUN: 16 mg/dL (ref 6–20)
CO2: 29 mmol/L (ref 22–32)
Calcium: 9.1 mg/dL (ref 8.9–10.3)
Chloride: 104 mmol/L (ref 98–111)
Creatinine, Ser: 0.66 mg/dL (ref 0.44–1.00)
GFR calc Af Amer: >60 mL/min (ref >60)
GFR calc non Af Amer: >60 mL/min (ref >60)
Glucose, Bld: 118 mg/dL — ABNORMAL HIGH (ref 70–99)
Potassium: 3.8 mmol/L (ref 3.5–5.1)
Sodium: 138 mmol/L (ref 135–145)
Total Bilirubin: 0.5 mg/dL (ref 0.3–1.2)
Total Protein: 7.9 g/dL (ref 6.5–8.1)

## 2018-04-02 LAB — SALICYLATE LEVEL: Salicylate Lvl: <7 mg/dL (ref 2.8–30.0)

## 2018-04-02 LAB — PREGNANCY, URINE: Preg Test, Ur: NEGATIVE

## 2018-04-02 LAB — ETHANOL: Alcohol, Ethyl (B): <10 mg/dL (ref <10)

## 2018-04-02 LAB — ACETAMINOPHEN LEVEL: Acetaminophen (Tylenol), Serum: <10 ug/mL — ABNORMAL LOW (ref 10–30)

## 2018-04-02 MED ORDER — CLONIDINE HCL 0.1 MG PO TABS
0.1000 mg | ORAL_TABLET | Freq: Once | ORAL | Status: AC
  Administered 2018-04-02: 0.1 mg via ORAL
  Filled 2018-04-02: qty 1

## 2018-04-02 MED ORDER — LORAZEPAM 2 MG/ML IJ SOLN
1.0000 mg | INTRAMUSCULAR | Status: DC | PRN
  Administered 2018-04-02 – 2018-04-03 (×3): 1 mg via INTRAVENOUS
  Filled 2018-04-02 (×3): qty 1

## 2018-04-02 MED ORDER — ACETAMINOPHEN 325 MG PO TABS: 650.0000 mg | ORAL_TABLET | Freq: Four times a day (QID) | ORAL | Status: DC | PRN

## 2018-04-02 MED ORDER — LORAZEPAM 1 MG PO TABS
1.0000 mg | ORAL_TABLET | Freq: Four times a day (QID) | ORAL | Status: DC | PRN
  Administered 2018-04-03: 1 mg via ORAL
  Filled 2018-04-02: qty 1

## 2018-04-02 MED ORDER — LORAZEPAM 2 MG/ML IJ SOLN: 1.0000 mg | Freq: Four times a day (QID) | INTRAMUSCULAR | Status: DC | PRN

## 2018-04-02 MED ORDER — MORPHINE SULFATE (PF) 2 MG/ML IV SOLN
2.0000 mg | Freq: Once | INTRAVENOUS | Status: AC
  Administered 2018-04-02: 2 mg via INTRAMUSCULAR
  Filled 2018-04-02: qty 1

## 2018-04-02 MED ORDER — DICYCLOMINE HCL 20 MG PO TABS
20.0000 mg | ORAL_TABLET | Freq: Four times a day (QID) | ORAL | Status: DC | PRN
  Filled 2018-04-02: qty 1

## 2018-04-02 MED ORDER — FOLIC ACID 1 MG PO TABS
1.0000 mg | ORAL_TABLET | Freq: Every day | ORAL | Status: DC
  Administered 2018-04-03 – 2018-04-04 (×2): 1 mg via ORAL
  Filled 2018-04-02 (×2): qty 1

## 2018-04-02 MED ORDER — ONDANSETRON 4 MG PO TBDP: 4.0000 mg | ORAL_TABLET | Freq: Four times a day (QID) | ORAL | Status: DC | PRN

## 2018-04-02 MED ORDER — ENOXAPARIN SODIUM 40 MG/0.4ML ~~LOC~~ SOLN
40.0000 mg | SUBCUTANEOUS | Status: DC
  Filled 2018-04-02: qty 0.4

## 2018-04-02 MED ORDER — HYDROXYZINE HCL 25 MG PO TABS
25.0000 mg | ORAL_TABLET | Freq: Four times a day (QID) | ORAL | Status: DC | PRN
  Administered 2018-04-03 – 2018-04-04 (×4): 25 mg via ORAL
  Filled 2018-04-02 (×4): qty 1

## 2018-04-02 MED ORDER — LORAZEPAM 1 MG PO TABS: 1.0000 mg | ORAL_TABLET | Freq: Once | ORAL | Status: DC

## 2018-04-02 MED ORDER — VITAMIN B-1 100 MG PO TABS
100.0000 mg | ORAL_TABLET | Freq: Every day | ORAL | Status: DC
  Administered 2018-04-03 – 2018-04-04 (×2): 100 mg via ORAL
  Filled 2018-04-02 (×2): qty 1

## 2018-04-02 MED ORDER — ACETAMINOPHEN 650 MG RE SUPP: 650.0000 mg | Freq: Four times a day (QID) | RECTAL | Status: DC | PRN

## 2018-04-02 MED ORDER — NICOTINE 21 MG/24HR TD PT24
21.0000 mg | MEDICATED_PATCH | Freq: Every day | TRANSDERMAL | Status: DC
  Administered 2018-04-03 – 2018-04-04 (×2): 21 mg via TRANSDERMAL
  Filled 2018-04-02 (×2): qty 1

## 2018-04-02 MED ORDER — LORAZEPAM 2 MG/ML IJ SOLN: 2.0000 mg | INTRAMUSCULAR | Status: DC

## 2018-04-02 MED ORDER — LORAZEPAM 2 MG/ML IJ SOLN
1.0000 mg | Freq: Once | INTRAMUSCULAR | Status: AC
  Administered 2018-04-02: 1 mg via INTRAMUSCULAR
  Filled 2018-04-02: qty 1

## 2018-04-02 MED ORDER — HYDROCODONE-ACETAMINOPHEN 5-325 MG PO TABS
1.0000 | ORAL_TABLET | ORAL | Status: DC | PRN
  Administered 2018-04-02 – 2018-04-04 (×8): 2 via ORAL
  Filled 2018-04-02 (×8): qty 2

## 2018-04-02 MED ORDER — LOPERAMIDE HCL 2 MG PO CAPS: 2.0000 mg | ORAL_CAPSULE | ORAL | Status: DC | PRN

## 2018-04-02 MED ORDER — THIAMINE HCL 100 MG/ML IJ SOLN: 100.0000 mg | Freq: Every day | INTRAMUSCULAR | Status: DC

## 2018-04-02 MED ORDER — ADULT MULTIVITAMIN W/MINERALS CH
1.0000 | ORAL_TABLET | Freq: Every day | ORAL | Status: DC
  Administered 2018-04-03 – 2018-04-04 (×2): 1 via ORAL
  Filled 2018-04-02 (×2): qty 1

## 2018-04-02 MED ORDER — CIPROFLOXACIN HCL 500 MG PO TABS
500.0000 mg | ORAL_TABLET | Freq: Two times a day (BID) | ORAL | Status: DC
  Administered 2018-04-03 – 2018-04-04 (×3): 500 mg via ORAL
  Filled 2018-04-02 (×3): qty 1

## 2018-04-02 MED ORDER — ONDANSETRON HCL 4 MG/2ML IJ SOLN: 4.0000 mg | Freq: Four times a day (QID) | INTRAMUSCULAR | Status: DC | PRN

## 2018-04-02 MED ORDER — METHOCARBAMOL 500 MG PO TABS
500.0000 mg | ORAL_TABLET | Freq: Three times a day (TID) | ORAL | Status: DC | PRN
  Administered 2018-04-03 – 2018-04-04 (×2): 500 mg via ORAL
  Filled 2018-04-02 (×4): qty 1

## 2018-04-02 MED ORDER — ONDANSETRON HCL 4 MG PO TABS: 4.0000 mg | ORAL_TABLET | Freq: Four times a day (QID) | ORAL | Status: DC | PRN

## 2018-04-02 NOTE — ED Notes (Signed)
Pt  Refuses to have valubles  Locked up  inventored  By Grover Canavankrystal  Sword  and matt martin sent up wiin a bag with patient  Inventory is  As  Follows  1 pair of strippers  1 pair of pants a pair of underwear  1 bra  1 shirt 1 pair of socks   1 sweater  1 scarf  1 hair tie   1 black purse with the following contents  35 dollars and 31 cent   1 lighter and 1 cell phone

## 2018-04-02 NOTE — H&P (Signed)
[employer] -  at [facility]   PATIENT NAME: Tara Raymond    MR#:  [id number]  DATE OF BIRTH:  05/30/1991  DATE OF ADMISSION:  04/02/2018  PRIMARY CARE PHYSICIAN: [facility]   REQUESTING/REFERRING PHYSICIAN: Arnaldo Natal, MD  CHIEF COMPLAINT:   Chief Complaint  Patient presents with  . Medical Clearance    HISTORY OF PRESENT ILLNESS: Lucciana Head  is a 27 y.o. female with a known history of substance abuse who is brought to the emergency room agitated and withdrawing from heroin.  The ER physician had to commit the patient because she was so agitated.  Her mother also is being admitted for same.    PAST MEDICAL HISTORY:   Past Medical History:  Diagnosis Date  . Anemia   . Cocaine use disorder (HCC)   . Dyspnea    with pregnancy  . Mental disorder   . Opioid use disorder, moderate, dependence (HCC)   . Substance induced mood disorder (HCC)     PAST SURGICAL HISTORY:  Past Surgical History:  Procedure Laterality Date  . CESAREAN SECTION N/A 02/09/2016   Procedure: CESAREAN SECTION;  Surgeon: Nadara Mustard, MD;  Location: [facility] ORS;  Service: Obstetrics;  Laterality: N/A;  . CESAREAN SECTION N/A 04/20/2017   Procedure: CESAREAN SECTION;  Surgeon: Vena Austria, MD;  Location: [facility] ORS;  Service: Obstetrics;  Laterality: N/A;    SOCIAL HISTORY:  Social History   Tobacco Use  . Smoking status: Current Every Day Smoker    Packs/day: 0.50    Types: Cigarettes  . Smokeless tobacco: Current User  Substance Use Topics  . Alcohol use: No    FAMILY HISTORY:  Family History  Problem Relation Age of Onset  . Drug abuse Mother     DRUG ALLERGIES:  Allergies  Allergen Reactions  . Naproxen Hives    REVIEW OF SYSTEMS:   CONSTITUTIONAL: No fever, fatigue or weakness.  EYES: No blurred or double vision.  EARS, NOSE, AND THROAT: No tinnitus or ear pain.  RESPIRATORY: No cough, shortness of breath,  wheezing or hemoptysis.  CARDIOVASCULAR: No chest pain, orthopnea, edema.  GASTROINTESTINAL: No nausea, vomiting, diarrhea or abdominal pain.  GENITOURINARY: No dysuria, hematuria.  ENDOCRINE: No polyuria, nocturia,  HEMATOLOGY: No anemia, easy bruising or bleeding SKIN: No rash or lesion. MUSCULOSKELETAL: No joint pain or arthritis.   NEUROLOGIC: Agitated tremulous PSYCHIATRY: No anxiety or depression.   MEDICATIONS AT HOME:  Prior to Admission medications   Medication Sig Start Date End Date Taking? Authorizing Provider  acetaminophen (TYLENOL) 500 MG tablet Take 500-1,000 mg by mouth every 6 (six) hours as needed (for pain.).    [provider]  oxyCODONE (OXY IR/ROXICODONE) 5 MG immediate release tablet Take 1 tablet (5 mg total) by mouth every 4 (four) hours as needed for severe pain. 04/23/17   Tresea Mall, CNM  oxyCODONE-acetaminophen (PERCOCET) 5-325 MG tablet Take 1-2 tablets by mouth every 8 (eight) hours as needed. 04/30/17   Emily Filbert, MD  Prenatal Multivit-Min-Fe-FA (PRENATAL VITAMINS) 0.8 MG tablet Take 1 tablet by mouth daily. Patient taking differently: Take 1 tablet by mouth at bedtime.  03/19/17   Tommi Rumps, PA-C  sulfamethoxazole-trimethoprim (BACTRIM DS) 800-160 MG tablet Take 1 tablet by mouth 2 (two) times daily. 04/30/17   Emily Filbert, MD      PHYSICAL EXAMINATION:   VITAL SIGNS: Blood pressure 110/62, pulse 100, temperature 98.6 F (37 C), temperature source Oral,  resp. rate 20, height 4\' 9"  (1.448 m), weight 49.9 kg, last menstrual period 02/18/2018, SpO2 96 %, unknown if currently breastfeeding.  GENERAL:  27 y.o.-year-old patient lying in the bed with no acute distress.  EYES: Pupils equal, round, reactive to light and accommodation. No scleral icterus. Extraocular muscles intact.  HEENT: Head atraumatic, normocephalic. Oropharynx and nasopharynx clear.  NECK:  Supple, no jugular venous distention. No thyroid enlargement,  no tenderness.  LUNGS: Normal breath sounds bilaterally, no wheezing, rales,rhonchi or crepitation. No use of accessory muscles of respiration.  CARDIOVASCULAR: S1, S2 normal. No murmurs, rubs, or gallops.  ABDOMEN: Soft, nontender, nondistended. Bowel sounds present. No organomegaly or mass.  EXTREMITIES: No pedal edema, cyanosis, or clubbing.  NEUROLOGIC: Very agitated and tremulous.  PSYCHIATRIC: The patient is alert and oriented x 3.  SKIN: No obvious rash, lesion, or ulcer.   LABORATORY PANEL:   CBC No results for input(s): WBC, HGB, HCT, PLT, MCV, MCH, MCHC, RDW, LYMPHSABS, MONOABS, EOSABS, BASOSABS, BANDABS in the last 168 hours.  Invalid input(s): NEUTRABS, BANDSABD ------------------------------------------------------------------------------------------------------------------  Chemistries  No results for input(s): NA, K, CL, CO2, GLUCOSE, BUN, CREATININE, CALCIUM, MG, AST, ALT, ALKPHOS, BILITOT in the last 168 hours.  Invalid input(s): GFRCGP ------------------------------------------------------------------------------------------------------------------ CrCl cannot be calculated (Patient's most recent lab result is older than the maximum 21 days allowed.). ------------------------------------------------------------------------------------------------------------------ No results for input(s): TSH, T4TOTAL, T3FREE, THYROIDAB in the last 72 hours.  Invalid input(s): FREET3   Coagulation profile No results for input(s): INR, PROTIME in the last 168 hours. ------------------------------------------------------------------------------------------------------------------- No results for input(s): DDIMER in the last 72 hours. -------------------------------------------------------------------------------------------------------------------  Cardiac Enzymes No results for input(s): CKMB, TROPONINI, MYOGLOBIN in the last 168 hours.  Invalid input(s):  CK ------------------------------------------------------------------------------------------------------------------ Invalid input(s): POCBNP  ---------------------------------------------------------------------------------------------------------------  Urinalysis    Component Value Date/Time   COLORURINE YELLOW (A) 05/05/2017 1159   APPEARANCEUR HAZY (A) 05/05/2017 1159   APPEARANCEUR Hazy 07/24/2014 1128   LABSPEC 1.012 05/05/2017 1159   LABSPEC 1.024 07/24/2014 1128   PHURINE 6.0 05/05/2017 1159   GLUCOSEU NEGATIVE 05/05/2017 1159   GLUCOSEU Negative 07/24/2014 1128   HGBUR MODERATE (A) 05/05/2017 1159   BILIRUBINUR NEGATIVE 05/05/2017 1159   BILIRUBINUR Negative 07/24/2014 1128   KETONESUR NEGATIVE 05/05/2017 1159   PROTEINUR NEGATIVE 05/05/2017 1159   NITRITE NEGATIVE 05/05/2017 1159   LEUKOCYTESUR MODERATE (A) 05/05/2017 1159   LEUKOCYTESUR Negative 07/24/2014 1128     RADIOLOGY: No results found.  EKG: Orders placed or performed during the hospital encounter of 09/10/16  . ED EKG  . ED EKG  . EKG 12-Lead  . EKG 12-Lead  . EKG    IMPRESSION AND PLAN: Patient is 27 year old with substance abuse  1.  Heroine withdrawal We will place on Ativan as needed Psychiatry consult in the morning  2.  Nicotine abuse smoking cessation provided 4 minutes spent strongly recommend she stop smoking start a nicotine patch  3.  Abnormal UA will follow urine cultures placed on oral Cipro  4. Misc: lovenox  All the records are reviewed and case discussed with ED provider. Management plans discussed with the patient, family and they are in agreement.  CODE STATUS: Code Status History    Date Active Date Inactive Code Status Order ID Comments User Context   04/20/2017 1528 04/23/2017 1549 Full Code [id number]  Vena AustriaStaebler, Andreas, MD Inpatient   02/12/2016 1724 02/12/2016 2301 Full Code [id number]  Audery Amellapacs, John T, MD Inpatient   02/12/2016 1724 02/12/2016 1724 Full Code [id number]   Clapacs, Jackquline DenmarkJohn T,  MD Inpatient   02/09/2016 1531 02/12/2016 1703 Full Code [id number]  Nadara Mustard, MD Inpatient   02/09/2016 0740 02/09/2016 0801 Full Code [id number]  Nadara Mustard, MD Inpatient   02/09/2016 0359 02/09/2016 0740 Full Code [id number]  Lissa Hoard, RN Inpatient   02/03/2016 0900 02/04/2016 0729 Full Code [id number]  Conard Novak, MD Inpatient       TOTAL TIME TAKING CARE OF THIS PATIENT:55 minutes.    Auburn Bilberry M.D on 04/02/2018 at 9:06 PM  Between 7am to 6pm - Pager - [phone]  After 6pm go to [email or web address] - Social research officer, government  [employer] Office  [phone]  CC: Primary care physician; [facility]

## 2018-04-02 NOTE — ED Triage Notes (Signed)
States wishes to detox from heroine, cocaine and ETOH. Alert and oriented.
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
## 2018-04-02 NOTE — ED Notes (Signed)
Belongings  Secured  Pt placed in paper scrubs
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
## 2018-04-03 ENCOUNTER — Other Ambulatory Visit: Payer: Self-pay

## 2018-04-03 DIAGNOSIS — F1123 Opioid dependence with withdrawal: Secondary | ICD-10-CM

## 2018-04-03 DIAGNOSIS — F1020 Alcohol dependence, uncomplicated: Secondary | ICD-10-CM

## 2018-04-03 LAB — CHLAMYDIA/NGC RT PCR (ARMC ONLY)
CHLAMYDIA TR: NOT DETECTED
N gonorrhoeae: NOT DETECTED

## 2018-04-03 MED ORDER — CLONIDINE HCL 0.1 MG PO TABS: 0.1000 mg | ORAL_TABLET | Freq: Every day | ORAL | Status: DC

## 2018-04-03 MED ORDER — GABAPENTIN 300 MG PO CAPS: 300.0000 mg | ORAL_CAPSULE | Freq: Three times a day (TID) | ORAL | Status: DC

## 2018-04-03 MED ORDER — BUPRENORPHINE HCL-NALOXONE HCL 2-0.5 MG SL SUBL: 1.0000 | SUBLINGUAL_TABLET | Freq: Once | SUBLINGUAL | Status: DC

## 2018-04-03 MED ORDER — CLONIDINE HCL 0.1 MG PO TABS: 0.1000 mg | ORAL_TABLET | Freq: Four times a day (QID) | ORAL | Status: DC

## 2018-04-03 MED ORDER — CLONIDINE HCL 0.1 MG PO TABS: 0.1000 mg | ORAL_TABLET | ORAL | Status: DC

## 2018-04-03 MED ORDER — TRAZODONE HCL 50 MG PO TABS
50.0000 mg | ORAL_TABLET | Freq: Every evening | ORAL | Status: DC | PRN
  Administered 2018-04-03: 50 mg via ORAL
  Filled 2018-04-03: qty 1

## 2018-04-03 MED ORDER — BUPRENORPHINE HCL-NALOXONE HCL 8-2 MG SL SUBL
1.0000 | SUBLINGUAL_TABLET | Freq: Once | SUBLINGUAL | Status: AC
  Administered 2018-04-03: 1 via SUBLINGUAL

## 2018-04-03 MED ORDER — BUPRENORPHINE HCL-NALOXONE HCL 2-0.5 MG SL SUBL
2.0000 | SUBLINGUAL_TABLET | Freq: Once | SUBLINGUAL | Status: AC
  Administered 2018-04-03: 2 via SUBLINGUAL
  Filled 2018-04-03: qty 2

## 2018-04-03 MED ORDER — LORAZEPAM 2 MG/ML IJ SOLN
INTRAMUSCULAR | Status: AC
  Administered 2018-04-03: 2 mg via INTRAVENOUS
  Filled 2018-04-03: qty 1

## 2018-04-03 MED ORDER — BUPRENORPHINE HCL-NALOXONE HCL 2-0.5 MG SL SUBL
2.0000 | SUBLINGUAL_TABLET | Freq: Two times a day (BID) | SUBLINGUAL | Status: DC
  Administered 2018-04-03 – 2018-04-04 (×2): 2 via SUBLINGUAL
  Filled 2018-04-03 (×2): qty 2

## 2018-04-03 MED ORDER — LORAZEPAM 2 MG/ML IJ SOLN
2.0000 mg | Freq: Once | INTRAMUSCULAR | Status: AC
  Administered 2018-04-03: 2 mg via INTRAVENOUS
  Filled 2018-04-03: qty 1

## 2018-04-03 MED ORDER — LORAZEPAM 2 MG/ML IJ SOLN
2.0000 mg | Freq: Once | INTRAMUSCULAR | Status: AC
  Administered 2018-04-03: 2 mg via INTRAVENOUS
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
## 2018-04-03 NOTE — Progress Notes (Signed)
[employer] - [place] at [facility]   PATIENT NAME: Tara Raymond    MR#:  [id number]  DATE OF BIRTH:  December 20, 1990  SUBJECTIVE:  CHIEF COMPLAINT:   Chief Complaint  Patient presents with  . Medical Clearance  Patient seen today Has increased level of anxiety No complaints of any chest pain No thoughts of hurting herself or hurting others  REVIEW OF SYSTEMS:    ROS  CONSTITUTIONAL: No documented fever. No fatigue, weakness. No weight gain, no weight loss.  EYES: No blurry or double vision.  ENT: No tinnitus. No postnasal drip. No redness of the oropharynx.  RESPIRATORY: No cough, no wheeze, no hemoptysis. No dyspnea.  CARDIOVASCULAR: No chest pain. No orthopnea. No palpitations. No syncope.  GASTROINTESTINAL: No nausea, no vomiting or diarrhea. No abdominal pain. No melena or hematochezia.  GENITOURINARY: No dysuria or hematuria.  ENDOCRINE: No polyuria or nocturia. No heat or cold intolerance.  HEMATOLOGY: No anemia. No bruising. No bleeding.  INTEGUMENTARY: No rashes. No lesions.  MUSCULOSKELETAL: No arthritis. No swelling. No gout.  NEUROLOGIC: No numbness, tingling, or ataxia. No seizure-type activity.  PSYCHIATRIC: Has anxiety. No insomnia. No ADD.   DRUG ALLERGIES:   Allergies  Allergen Reactions  . Naproxen Hives    VITALS:  Blood pressure 121/84, pulse 85, temperature 97.6 F (36.4 C), temperature source Oral, resp. rate 18, height 4\' 8"  (1.422 m), weight 49.5 kg, last menstrual period 02/18/2018, SpO2 100 %, unknown if currently breastfeeding.  PHYSICAL EXAMINATION:   Physical Exam  GENERAL:  27 y.o.-year-old patient lying in the bed with no acute distress.  EYES: Pupils equal, round, reactive to light and accommodation. No scleral icterus. Extraocular muscles intact.  HEENT: Head atraumatic, normocephalic. Oropharynx and nasopharynx clear.  NECK:  Supple, no jugular venous distention. No thyroid enlargement, no tenderness.  LUNGS:  Normal breath sounds bilaterally, no wheezing, rales, rhonchi. No use of accessory muscles of respiration.  CARDIOVASCULAR: S1, S2 normal. No murmurs, rubs, or gallops.  ABDOMEN: Soft, nontender, nondistended. Bowel sounds present. No organomegaly or mass.  EXTREMITIES: No cyanosis, clubbing or edema b/l.    NEUROLOGIC: Cranial nerves II through XII are intact. No focal Motor or sensory deficits b/l.   PSYCHIATRIC: The patient is alert and oriented x 3.  SKIN: No obvious rash, lesion, or ulcer.   LABORATORY PANEL:   CBC Recent Labs  Lab 04/02/18 2103  WBC 7.2  HGB 12.5  HCT 36.7  PLT 258   ------------------------------------------------------------------------------------------------------------------ Chemistries  Recent Labs  Lab 04/02/18 2103  NA 138  K 3.8  CL 104  CO2 29  GLUCOSE 118*  BUN 16  CREATININE 0.66  CALCIUM 9.1  AST 27  ALT 22  ALKPHOS 49  BILITOT 0.5   ------------------------------------------------------------------------------------------------------------------  Cardiac Enzymes No results for input(s): TROPONINI in the last 168 hours. ------------------------------------------------------------------------------------------------------------------  RADIOLOGY:  No results found.   ASSESSMENT AND PLAN:   27 year old female patient with history of cocaine abuse, opioid abuse, anemia currently under hospitalist service for heroin withdrawal  -Active heroin withdrawal Appreciate psychiatry consultation Buprenorphine detox protocol with quick taper PRN Ativan for anxiety  -Substance abuse Substance abuse counseling given to the patient  -Case management follow-up for arrangement of outpatient services for substance abuse  -Patient not danger for herself or others according to psychiatry consultation  -DVT prophylaxis with subcu Lovenox daily  -Acute cystitis Continue oral ciprofloxacin  All the records are reviewed and case discussed  with Care Management/Social Worker. Management plans discussed with  the patient, family and they are in agreement.  CODE STATUS: Full code  DVT Prophylaxis: SCDs  TOTAL TIME TAKING CARE OF THIS PATIENT: 33 minutes.   POSSIBLE D/C IN 1 to 2 DAYS, DEPENDING ON CLINICAL CONDITION.  Ihor AustinPavan Pyreddy M.D on 04/03/2018 at 1:27 PM  Between 7am to 6pm - Pager - [phone]  After 6pm go to [email or web address] - password [email or web address] [facility]  [facility] [place] [facility]  Office  [phone]  CC: Primary care physician; [facility]  Note: This dictation was prepared with Nurse, children'sDragon dictation along with smaller phrase technology. Any transcriptional errors that result from this process are unintentional.
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
## 2018-04-03 NOTE — Progress Notes (Signed)
Patient is dressed and trying to leave. Security called. Dr. Tobi BastosPyreddy contacted and gave a verbal order for a one time dose of Ativan 2mg .
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
## 2018-04-03 NOTE — Progress Notes (Signed)
   04/03/18 1730  Clinical Encounter Type  Visited With Patient not available;Health care provider  Visit Type Other (Comment) (security alert response)  Spiritual Encounters  Spiritual Needs Prayer   Chaplain responded to security alert, maintaining calming presence and offering silent and energetic prayers for patient and care team.  Chaplain attempted introduction, patient kept her back to chaplain, engaging in telephone conversations.  Chaplain spoke with staff then excused herself, encouraged staff to page chaplain as needed.
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
## 2018-04-04 ENCOUNTER — Encounter: Payer: Self-pay | Admitting: *Deleted

## 2018-04-04 NOTE — Progress Notes (Signed)
Called by sitter due to the pt becoming extremely agitated, flailing in the bed, pulling her hair and screaming that she "needed to get out of here". Attempted to speak with pt but was unable to understand her due to her screaming and crying. Able to finally understand she had spoken with her mother by telephone and was upset due to her mother being discharged as pt was not to be discharged yet.  RN asked secretary to call security due to pt's active IVC status and concern for safety of pt and staff. Pt has hx of aggressive activity toward staff while hospitalized this visit. MD paged as well. MD arrived on floor to speak with pt, as well as Monica SW at bedside, with security standing by for safety. Pt spoke with MD and agreed that she was not wanting to hurt herself and was willing to seek treatment. Pt requested to speak with Psych MD as well. Dr Tobi BastosPyreddy stated that he would ask the Psych MD to come speak with pt and would work toward a discharge plan. Monica SW to speak with possible out pt rehab opportunities for the pt and her mother. Dr Tobi BastosPyreddy spoke with Psych MD and that MD stated he would come reassess pt. Pt also provided with anxiety medication and pain medication PRN per her request. Pt calm by end of conversation and agreed that aggressive behavior and screaming were not productive and was willing to be calm while she waited for the MD to come see her. Assured pt that staff were all working with her and attempting to keep her safe. Pt verbalizes understanding.

## 2018-04-04 NOTE — Clinical Social Work Note (Signed)
Attending physician has rescinded patient's involuntary commitment. CSW has faxed the commitment change into the [place] Clerk of court. York SpanielMonica Davanee Klinkner MSW,LcSW [phone]

## 2018-04-04 NOTE — Clinical Social Work Note (Signed)
CSW walked in this morning to patient screaming and cursing at staff in her room. CSW entered patient's room and de-escalated the situation. Patient calm by end of conversation. CSW contacted [facility] today in [place] and gave criteria for patient (and her mother as her mother is also a patient in the hospital) to be seen. Patient was very insistent that she could go somewhere with her mother for outpatient treatment. Psych to see patient to make determination to rescind the involuntary commitment.  York SpanielMonica Deandrae Raymond MSW,LcSW [phone]

## 2018-04-04 NOTE — Discharge Summary (Signed)
[facility] [place] [facility]   PATIENT NAME: Tara Raymond    MR#:  [id number]  DATE OF BIRTH:  25-Jan-1991  DATE OF ADMISSION:  04/02/2018 ADMITTING PHYSICIAN: Auburn Bilberry, MD  DATE OF DISCHARGE: 04/04/2018  PRIMARY CARE PHYSICIAN: [facility]   ADMISSION DIAGNOSIS:  Cocaine withdrawal (HCC) [F14.23] Opioid withdrawal (HCC) [F11.23] Alcohol withdrawal syndrome with perceptual disturbance (HCC) [F10.232]  DISCHARGE DIAGNOSIS:  Principal Problem:   Opiate withdrawal (HCC) Active Problems:   Substance induced mood disorder (HCC)   Severe major depression, single episode, without psychotic features (HCC)   Cocaine use disorder, moderate, dependence (HCC)   Substance abuse (HCC)   Alcohol use disorder, moderate, dependence (HCC)   SECONDARY DIAGNOSIS:   Past Medical History:  Diagnosis Date  . Anemia   . Cocaine use disorder (HCC)   . Dyspnea    with pregnancy  . Mental disorder   . Opioid use disorder, moderate, dependence (HCC)   . Substance induced mood disorder (HCC)      ADMITTING HISTORY  Tara Raymond  is a 27 y.o. female with a known history of substance abuse who is brought to the emergency room agitated and withdrawing from heroin.  The ER physician had to commit the patient because she was so agitated.  Her mother also is being admitted for same.  HOSPITAL COURSE:  Patient was admitted to medical floor.  Psychiatry consultation was done during the hospitalization.  No thoughts of hurting herself or others.  According to psychiatry attending no imminent risk to self or others currently.  Social worker consultation was done during the hospitalization.  Patient received buprenorphine detox briefly during the stay in the hospital.  She also received CIWA protocol for alcohol abuse.  Patient tolerated diet well.  Involuntary commitment rescinded.  Involuntary commitment not indicated as per psychiatry  consultation.  Patient enrolled in substance abuse counseling programs by [facility] in the community.  As outpatient.  Patient will be discharged home and follow-up in the substance abuse treatment programs as outpatient.  Patient hemodynamically stable will be discharged home today.  CONSULTS OBTAINED:  Treatment Team:  He, Jun, MD Clapacs, Jackquline Denmark, MD  DRUG ALLERGIES:   Allergies  Allergen Reactions  . Naproxen Hives    DISCHARGE MEDICATIONS:   Allergies as of 04/04/2018      Reactions   Naproxen Hives      Medication List    STOP taking these medications   acetaminophen 500 MG tablet Commonly known as:  TYLENOL   oxyCODONE 5 MG immediate release tablet Commonly known as:  Oxy IR/ROXICODONE   oxyCODONE-acetaminophen 5-325 MG tablet Commonly known as:  PERCOCET/ROXICET   Prenatal Vitamins 0.8 MG tablet   sulfamethoxazole-trimethoprim 800-160 MG tablet Commonly known as:  BACTRIM DS,SEPTRA DS       Today  Patient seen and evaluated today Mood is pleasant Tolerating diet well No thoughts of hurting herself or hurting others  VITAL SIGNS:  Blood pressure 97/73, pulse 79, temperature 98.3 F (36.8 C), temperature source Oral, resp. rate 16, height 4\' 8"  (1.422 m), weight 49.5 kg, last menstrual period 02/18/2018, SpO2 100 %, unknown if currently breastfeeding.  I/O:    Intake/Output Summary (Last 24 hours) at 04/04/2018 1534 Last data filed at 04/04/2018 0900 Gross per 24 hour  Intake 0 ml  Output -  Net 0 ml    PHYSICAL EXAMINATION:  Physical Exam  GENERAL:  27 y.o.-year-old patient lying in the bed with  no acute distress.  LUNGS: Normal breath sounds bilaterally, no wheezing, rales,rhonchi or crepitation. No use of accessory muscles of respiration.  CARDIOVASCULAR: S1, S2 normal. No murmurs, rubs, or gallops.  ABDOMEN: Soft, non-tender, non-distended. Bowel sounds present. No organomegaly or mass.  NEUROLOGIC: Moves all 4 extremities. PSYCHIATRIC: The  patient is alert and oriented x 3.  SKIN: No obvious rash, lesion, or ulcer.   DATA REVIEW:   CBC Recent Labs  Lab 04/02/18 2103  WBC 7.2  HGB 12.5  HCT 36.7  PLT 258    Chemistries  Recent Labs  Lab 04/02/18 2103  NA 138  K 3.8  CL 104  CO2 29  GLUCOSE 118*  BUN 16  CREATININE 0.66  CALCIUM 9.1  AST 27  ALT 22  ALKPHOS 49  BILITOT 0.5    Cardiac Enzymes No results for input(s): TROPONINI in the last 168 hours.  Microbiology Results  Results for orders placed or performed during the hospital encounter of 04/02/18  Chlamydia/NGC rt PCR ([facility] only)     Status: None   Collection Time: 04/02/18 11:00 PM  Result Value Ref Range Status   Specimen source GC/Chlam URINE, RANDOM  Final   Chlamydia Tr NOT DETECTED NOT DETECTED Final   N gonorrhoeae NOT DETECTED NOT DETECTED Final    Comment: (NOTE) This CT/NG assay has not been evaluated in patients with a history of  hysterectomy. Performed at [facility], [address] [place], [place] [address]     RADIOLOGY:  No results found.  Follow up with PCP in 1 week.  Management plans discussed with the patient, family and they are in agreement.  CODE STATUS: Full code    Code Status Orders  (From admission, onward)         Start     Ordered   04/02/18 2214  Full code  Continuous     04/02/18 2213        Code Status History    Date Active Date Inactive Code Status Order ID Comments User Context   04/20/2017 1528 04/23/2017 1549 Full Code [id number]  Vena AustriaStaebler, Andreas, MD Inpatient   02/12/2016 1724 02/12/2016 2301 Full Code [id number]  Audery Amellapacs, John T, MD Inpatient   02/12/2016 1724 02/12/2016 1724 Full Code [id number]  Audery Amellapacs, John T, MD Inpatient   02/09/2016 1531 02/12/2016 1703 Full Code [id number]  Nadara MustardHarris, Robert P, MD Inpatient   02/09/2016 0740 02/09/2016 0801 Full Code [id number]  Nadara MustardHarris, Robert P, MD Inpatient   02/09/2016 0359 02/09/2016 0740 Full Code [id number]  Lissa HoardEvans, Alexis D, RN Inpatient    02/03/2016 0900 02/04/2016 0729 Full Code [id number]  Conard NovakJackson, Stephen D, MD Inpatient      TOTAL TIME TAKING CARE OF THIS PATIENT ON DAY OF DISCHARGE: more than 34 minutes.   Ihor AustinPavan Miguelangel Korn M.D on 04/04/2018 at 3:34 PM  Between 7am to 6pm - Pager - [phone]  After 6pm go to [email or web address] - password [email or web address] [facility]  [facility]  Office  [phone]  CC: Primary care physician; [facility]  Note: This dictation was prepared with Nurse, children'sDragon dictation along with smaller phrase technology. Any transcriptional errors that result from this process are unintentional.

## 2018-04-04 NOTE — Discharge Instructions (Signed)
Alcohol Use Disorder °Alcohol use disorder is when your drinking disrupts your daily life. When you have this condition, you drink too much alcohol and you cannot control your drinking. °Alcohol use disorder can cause serious problems with your physical health. It can affect your brain, heart, liver, pancreas, immune system, stomach, and intestines. Alcohol use disorder can increase your risk for certain cancers and cause problems with your mental health, such as depression, anxiety, psychosis, delirium, and dementia. People with this disorder risk hurting themselves and others. °What are the causes? °This condition is caused by drinking too much alcohol over time. It is not caused by drinking too much alcohol only one or two times. Some people with this condition drink alcohol to cope with or escape from negative life events. Others drink to relieve pain or symptoms of mental illness. °What increases the risk? °You are more likely to develop this condition if: °· You have a family history of alcohol use disorder. °· Your culture encourages drinking to the point of intoxication, or makes alcohol easy to get. °· You had a mood or conduct disorder in childhood. °· You have been a victim of abuse. °· You are an adolescent and: °? You have poor grades or difficulties in school. °? Your caregivers do not talk to you about saying no to alcohol, or supervise your activities. °? You are impulsive or you have trouble with self-control. ° °What are the signs or symptoms? °Symptoms of this condition include: °· Drinking more than you want to. °· Drinking for longer than you want to. °· Trying several times to drink less or to control your drinking. °· Spending a lot of time getting alcohol, drinking, or recovering from drinking. °· Craving alcohol. °· Having problems at work, at school, or at home due to drinking. °· Having problems in relationships due to drinking. °· Drinking when it is dangerous to drink, such as before  driving a car. °· Continuing to drink even though you know you might have a physical or mental problem related to drinking. °· Needing more and more alcohol to get the same effect you want from the alcohol (building up tolerance). °· Having symptoms of withdrawal when you stop drinking. Symptoms of withdrawal include: °? Fatigue. °? Nightmares. °? Trouble sleeping. °? Depression. °? Anxiety. °? Fever. °? Seizures. °? Severe confusion. °? Feeling or seeing things that are not there (hallucinations). °? Tremors. °? Rapid heart rate. °? Rapid breathing. °? High blood pressure. °· Drinking to avoid symptoms of withdrawal. ° °How is this diagnosed? °This condition is diagnosed with an assessment. Your health care provider may start the assessment by asking three or four questions about your drinking. °Your health care provider may perform a physical exam or do lab tests to see if you have physical problems resulting from alcohol use. She or he may refer you to a mental health professional for evaluation. °How is this treated? °Some people with alcohol use disorder are able to reduce their alcohol use to low-risk levels. Others need to completely quit drinking alcohol. When necessary, mental health professionals with specialized training in substance use treatment can help. Your health care provider can help you decide how severe your alcohol use disorder is and what type of treatment you need. The following forms of treatment are available: °· Detoxification. Detoxification involves quitting drinking and using prescription medicines within the first week to help lessen withdrawal symptoms. This treatment is important for people who have had withdrawal symptoms before and for   heavy drinkers who are likely to have withdrawal symptoms. Alcohol withdrawal can be dangerous, and in severe cases, it can cause death. Detoxification may be provided in a home, community, or primary care setting, or in a hospital or substance use  treatment facility. °· Counseling. This treatment is also called talk therapy. It is provided by substance use treatment counselors. A counselor can address the reasons you use alcohol and suggest ways to keep you from drinking again or to prevent problem drinking. The goals of talk therapy are to: °? Find healthy activities and ways for you to cope with stress. °? Identify and avoid the things that trigger your alcohol use. °? Help you learn how to handle cravings. °· Medicines. Medicines can help treat alcohol use disorder by: °? Decreasing alcohol cravings. °? Decreasing the positive feeling you have when you drink alcohol. °? Causing an uncomfortable physical reaction when you drink alcohol (aversion therapy). °· Support groups. Support groups are led by people who have quit drinking. They provide emotional support, advice, and guidance. ° °These forms of treatment are often combined. Some people with this condition benefit from a combination of treatments provided by specialized substance use treatment centers. °Follow these instructions at home: °· Take over-the-counter and prescription medicines only as told by your health care provider. °· Check with your health care provider before starting any new medicines. °· Ask friends and family members not to offer you alcohol. °· Avoid situations where alcohol is served, including gatherings where others are drinking alcohol. °· Create a plan for what to do when you are tempted to use alcohol. °· Find hobbies or activities that you enjoy that do not include alcohol. °· Keep all follow-up visits as told by your health care provider. This is important. °How is this prevented? °· If you drink, limit alcohol intake to no more than 1 drink a day for nonpregnant women and 2 drinks a day for men. One drink equals 12 oz of beer, 5 oz of wine, or 1½ oz of hard liquor. °· If you have a mental health condition, get treatment and support. °· Do not give alcohol to  adolescents. °· If you are an adolescent: °? Do not drink alcohol. °? Do not be afraid to say no if someone offers you alcohol. Speak up about why you do not want to drink. You can be a positive role model for your friends and set a good example for those around you by not drinking alcohol. °? If your friends drink, spend time with others who do not drink alcohol. Make new friends who do not use alcohol. °? Find healthy ways to manage stress and emotions, such as meditation or deep breathing, exercise, spending time in nature, listening to music, or talking with a trusted friend or family member. °Contact a health care provider if: °· You are not able to take your medicines as told. °· Your symptoms get worse. °· You return to drinking alcohol (relapse) and your symptoms get worse. °Get help right away if: °· You have thoughts about hurting yourself or others. °If you ever feel like you may hurt yourself or others, or have thoughts about taking your own life, get help right away. You can go to your nearest emergency department or call: °· Your local emergency services (911 in the [place] °· A suicide crisis helpline, such as the National Suicide Prevention Lifeline at 1-800-273-8255. This is open 24 hours a day. ° °Summary °· Alcohol use disorder is when your   drinking disrupts your daily life. When you have this condition, you drink too much alcohol and you cannot control your drinking.  Treatment may include detoxification, counseling, medicine, and support groups.  Ask friends and family members not to offer you alcohol. Avoid situations where alcohol is served.  Get help right away if you have thoughts about hurting yourself or others. This information is not intended to replace advice given to you by your health care provider. Make sure you discuss any questions you have with your health care provider. Document Released: 08/06/2004 Document Revised: 03/26/2016 Document Reviewed: 03/26/2016 Elsevier  Interactive Patient Education  2018 [employer]   Chemical Dependency Chemical dependency is an addiction to drugs or alcohol. People with this addiction repeatedly seek out and use drugs or alcohol despite negative consequences to the health and safety of themselves and others. Addiction changes the way the brain works. Because of these changes, addiction is a chronic condition. The medical term for addiction or chemical dependency is substance use disorder. The disorder can be mild, moderate, or severe. People can be dependent on a range of substances. These include alcohol, prescription medicines, and illegal or street drugs, such as marijuana, heroin, and cocaine. What are the causes? This condition is caused by the effect that the abused substance has on the brain. What increases the risk? The following factors may make you more likely to develop this condition:  Havinga family history of chemical dependency.  Having mental health issues, such as depression, anxiety, or bipolar disorder.  Living in an environment where drugs and alcohol are easily available.  Using drugs or alcohol at a young age.  Having friends who use drugs or alcohol.  Having poor social skills.  Tending to be aggressive or impulsive.  What are the signs or symptoms? Symptoms may vary depending on the substance that you are addicted to. Symptoms may include the following: Physical Symptoms  The inability to limit the use of drugs or alcohol.  Having nausea, sweating, shakiness, and anxiety when you are not using alcohol or drugs.  Needing a greater amount of drugs or alcohol to get the same effect (developing tolerance).  A change in: ? Sleeping habits. ? Eating or appetite. ? Appearance or how you care for yourself. Emotional Symptoms  Angry outbursts.  Periods of sadness and tearfulness.  Isolation. Relationship Problems  Loved ones suggesting that you have a problem.  Increased  fights.  Forgetting commitments.  Having affairs or one-night stands. Problems Related to Irresponsibility  Legal problems.  Irresponsibility with money.  Missing work.  Poor decision making. How is this diagnosed? This condition may be diagnosed based on your symptoms, your medical history, and a physical exam. You may also have blood tests and urine tests. How is this treated? Treatment for this condition depends on the substance that you are addicted to and whether your dependency is mild, moderate, or severe. Treatment options may include:  Stopping substance use safely. This may require taking medicines and being closely observed for several days.  Taking part in group and individual counseling with mental health providers who help people with chemical dependency.  Staying at a residential treatment center for several days or weeks.  Attending daily counseling sessions at a treatment center.  Taking medicine as told by your health care provider to: ? Ease symptoms and prevent complications during withdrawal. ? Treat other mental health issues, such as depression or anxiety. ? Block cravings by causing the same effects as the substance. ?  Block the effects of the substance or replace good sensations with unpleasant ones.  Going to a support group to share your experience with others who are going through the same thing. These groups are an important part of long-term recovery for many people. They include 12-step programs like Alcoholics Anonymous and Narcotics Anonymous.  Recovery can be a long process. Many people who undergo treatment start using the substance again after stopping. This is called a relapse. If you have a relapse, it does not mean that treatment will not work. Follow these instructions at home:  Avoid temptations or triggers that you associate with your use of the substance.  Learn and practice techniques for managing stress.  Have a plan for  vulnerable moments.  Get phone numbers of those who are willing to help and who are committed to your recovery.  Know when and where the meetings that you have chosen will occur.  Take over-the-counter and prescription medicines only as told by your health care provider.  Keep all follow-up visits as told by your health care provider. This is important. Contact a health care provider if:  You cannot take your medicines as told.  Your symptoms get worse.  You have trouble resisting the urge to use drugs or alcohol.  You are in pain, shaking, sweating, or feeling generally unwell.  You are losing weight without trying to. Get help right away if:  You lose consciousness.  Your breathing is slow.  Your pulse is slow or jumpy.  You have serious thoughts about hurting yourself or someone else.  You have a relapse. This information is not intended to replace advice given to you by your health care provider. Make sure you discuss any questions you have with your health care provider. Document Released: 06/23/2001 Document Revised: 08/05/2015 Document Reviewed: 03/06/2015 Elsevier Interactive Patient Education  2018 [employer].   Finding Treatment for Addiction What is addiction? Addiction is a complex disease of the brain. It causes an uncontrollable (compulsive) need for a substance. You can be addicted to alcohol, illegal drugs, or prescription medicines such as painkillers. Addiction can also be a behavior, like gambling or shopping. The need for the drug or activity can become so strong that you think about it all the time. You can also become physically dependent on a substance. Addiction can change the way your brain works. Because of these changes, getting more of whatever you are addicted to becomes the most important thing to you and feels better than other activities or relationships. Addiction can lead to changes in health, behavior, emotions, relationships, and choices  that affect you and everyone around you. How do I know if I need treatment for addiction? Addiction is a progressive disease. Without treatment, addiction can get worse. Living with addiction puts you at higher risk for injury, poor health, lost employment, loss of money, and even death. You might need treatment for addiction if:  You have tried to stop or cut down, but you cannot.  Your addiction is causing physical health problems.  You find it annoying that your friends and family are concerned about your alcohol or substance use.  You feel guilty about substance abuse or a compulsive behavior.  You have lied or tried to hide your addiction.  You need a particular substance or activity to start your day or to calm down.  You are getting in trouble at school, work, home, or with the police.  You have done something illegal to support your addiction.  You are running out of money because of your addiction.  You have no time for anything other than your addiction.  What types of treatment are available? The treatment program that is right for you will depend on many factors, including the type of addiction you have. Treatment programs can be outpatient or inpatient. In an outpatient program, you live at home and go to work or school, but you also go to a clinic for treatment. With an inpatient program, you live and sleep at the program facility during treatment. After treatment, you might need a plan for support during recovery. Other treatment options include:  Medicine. ? Some addictions may be treated with prescription medicines. ? You might also need medicine to treat anxiety or depression.  Counseling and behavior therapy. Therapy can help individuals and families behave in healthier ways and relate more effectively.  Support groups. Confidential group therapy, such as a 12-step program, can help individuals and families during treatment and recovery.  No single type of  program is right for everyone. Many treatment programs involve a combination of education, counseling, and a 12-step, spiritually-based approach. Some treatment programs are government sponsored. They are geared for patients who do not have private insurance. Treatment programs can vary in many respects, such as:  Cost and types of insurance that are accepted.  Types of on-site medical services that are offered.  Length of stay, setting, and size.  Overall philosophy of treatment.  What should I consider when selecting a treatment program? It is important to think about your individual requirements when selecting a treatment program. There are a number of things to consider, such as:  If the program is certified by the appropriate government agency. Even private programs must be certified and employ certified professionals.  If the program is covered by your insurance. If finances are a concern, the first call you should make is to your insurance company, if you have health insurance. Ask for a list of treatment programs that are in your network, and confirm any copayments and deductibles that you may have to pay. ? If you do not have insurance, or if you choose to attend a program that does not accept your insurance, discuss whether a payment plan can be set up.  If treatment is available in languages other than English, if needed.  If the program offers detoxification treatment, if needed.  If 12-step meetings are held at the center or if transport is available for patients to attend meetings at other locations.  If the program is professional, organized, and clean.  If the program meets all of your needs, including physical and cultural needs.  If the facility offers specific treatment for your particular addiction.  If support continues to be offered after you have left the program.  If your treatment plan is continually looked at to make sure you are receiving the right treatment  at the right time.  If mental health counseling is part of your treatment.  If medicine is included in treatment, if needed.  If your family is included in your treatment plan and if support is offered to them throughout the treatment process.  How the treatment works to prevent relapse.  Where else can I get help?  Your health care provider. Ask him or her to help you find addiction treatment. These discussions are confidential.  The [employer] on Alcoholism and Drug Dependence (NCADD). This group has information about treatment centers and programs for people who have an addiction  and for family members. ? The telephone number is 1-800-NCA-CALL (701-386-3083). ? The website is https://ncadd.org/about-ncadd/our-affiliates  The Substance Abuse and Mental Health Services [facility]). This group will help you find publicly funded treatment centers, help hotlines, and counseling services near you. ? The telephone number is 1-800-662-HELP (850 295 9010). ? The website is www.findtreatment.[employer].pl In countries outside of the [place]. and [place], look in [employer] for contact information for services in your area. This information is not intended to replace advice given to you by your health care provider. Make sure you discuss any questions you have with your health care provider. Document Released: 05/28/2005 Document Revised: 05/25/2016 Document Reviewed: 04/17/2014 Elsevier Interactive Patient Education  2017 [employer].

## 2018-04-04 NOTE — Progress Notes (Signed)
[facility] was DC'd by Dr. Tobi BastosPyreddy. Patient has been discharge by Dr. Toni Amendlapacs.  Social Worker advised this nurse that patient is good to go.

## 2018-04-04 NOTE — Progress Notes (Addendum)
Nsg Discharge Note  Admit Date:  04/02/2018 Discharge date: 04/04/2018   Cecille Averctavia N XXXBriggs to be D/C'd Home per MD order.  AVS completed.  Copy for chart, and copy for patient signed, and dated. Patient/caregiver able to verbalize understanding.  Discharge Medication: Allergies as of 04/04/2018      Reactions   Naproxen Hives      Medication List    STOP taking these medications   acetaminophen 500 MG tablet Commonly known as:  TYLENOL   oxyCODONE 5 MG immediate release tablet Commonly known as:  Oxy IR/ROXICODONE   oxyCODONE-acetaminophen 5-325 MG tablet Commonly known as:  PERCOCET/ROXICET   Prenatal Vitamins 0.8 MG tablet   sulfamethoxazole-trimethoprim 800-160 MG tablet Commonly known as:  BACTRIM DS,SEPTRA DS       Discharge Assessment: Vitals:   04/04/18 0433 04/04/18 1033  BP: 102/72 97/73  Pulse: 70 79  Resp: 16   Temp: 98.3 F (36.8 C)   SpO2: 100% 100%   Skin clean, dry and intact without evidence of skin break down, no evidence of skin tears noted. IV catheter discontinued intact. Site without signs and symptoms of complications - no redness or edema noted at insertion site, patient denies c/o pain - only slight tenderness at site.  Dressing with slight pressure applied.  D/c Instructions-Education: Discharge instructions given to patient/family with verbalized understanding. D/c education completed with patient/family including follow up instructions, medication list, d/c activities limitations if indicated, with other d/c instructions as indicated by MD - patient able to verbalize understanding, all questions fully answered. Patient instructed to return to ED, call 911, or call MD for any changes in condition.  Patient chose to ambulate to her mother's room who is also a pt in the hospital. Pt declined a wheelchair. Pt stated she would "find a way home". Pt refused to allow the RN to assist with that.  Adair LaundryElizabeth A Tiwatope Emmitt, RN 04/04/2018 3:52 PM

## 2018-07-08 ENCOUNTER — Encounter: Payer: Self-pay | Admitting: Emergency Medicine

## 2018-07-08 ENCOUNTER — Emergency Department
Admission: EM | Admit: 2018-07-08 | Discharge: 2018-07-08 | Disposition: A | Discharge status: Other Acute Inpatient Hospital | Payer: Self-pay | Attending: Emergency Medicine | Admitting: Emergency Medicine

## 2018-07-08 ENCOUNTER — Inpatient Hospital Stay
Admission: AD | Admit: 2018-07-08 | Discharge: 2018-07-11 | DRG: 885 | Disposition: A | Discharge status: Home / Self Care | Payer: No Typology Code available for payment source | Attending: Psychiatry | Admitting: Psychiatry

## 2018-07-08 ENCOUNTER — Other Ambulatory Visit: Payer: Self-pay

## 2018-07-08 DIAGNOSIS — F329 Major depressive disorder, single episode, unspecified: Secondary | ICD-10-CM | POA: Insufficient documentation

## 2018-07-08 DIAGNOSIS — Z813 Family history of other psychoactive substance abuse and dependence: Secondary | ICD-10-CM

## 2018-07-08 DIAGNOSIS — Z79899 Other long term (current) drug therapy: Secondary | ICD-10-CM | POA: Diagnosis not present

## 2018-07-08 DIAGNOSIS — F17200 Nicotine dependence, unspecified, uncomplicated: Secondary | ICD-10-CM | POA: Diagnosis present

## 2018-07-08 DIAGNOSIS — F4310 Post-traumatic stress disorder, unspecified: Secondary | ICD-10-CM | POA: Diagnosis present

## 2018-07-08 DIAGNOSIS — F1420 Cocaine dependence, uncomplicated: Secondary | ICD-10-CM | POA: Diagnosis present

## 2018-07-08 DIAGNOSIS — Z791 Long term (current) use of non-steroidal anti-inflammatories (NSAID): Secondary | ICD-10-CM | POA: Diagnosis not present

## 2018-07-08 DIAGNOSIS — F1120 Opioid dependence, uncomplicated: Secondary | ICD-10-CM | POA: Insufficient documentation

## 2018-07-08 DIAGNOSIS — F1111 Opioid abuse, in remission: Secondary | ICD-10-CM | POA: Diagnosis present

## 2018-07-08 DIAGNOSIS — F322 Major depressive disorder, single episode, severe without psychotic features: Secondary | ICD-10-CM

## 2018-07-08 DIAGNOSIS — T50904A Poisoning by unspecified drugs, medicaments and biological substances, undetermined, initial encounter: Secondary | ICD-10-CM | POA: Insufficient documentation

## 2018-07-08 DIAGNOSIS — F333 Major depressive disorder, recurrent, severe with psychotic symptoms: Secondary | ICD-10-CM | POA: Diagnosis present

## 2018-07-08 DIAGNOSIS — T424X1A Poisoning by benzodiazepines, accidental (unintentional), initial encounter: Secondary | ICD-10-CM

## 2018-07-08 DIAGNOSIS — R45851 Suicidal ideations: Secondary | ICD-10-CM | POA: Insufficient documentation

## 2018-07-08 DIAGNOSIS — F17210 Nicotine dependence, cigarettes, uncomplicated: Secondary | ICD-10-CM | POA: Diagnosis present

## 2018-07-08 DIAGNOSIS — F1121 Opioid dependence, in remission: Secondary | ICD-10-CM | POA: Diagnosis present

## 2018-07-08 DIAGNOSIS — F32A Depression, unspecified: Secondary | ICD-10-CM

## 2018-07-08 LAB — COMPREHENSIVE METABOLIC PANEL
ALBUMIN: 4.3 g/dL (ref 3.5–5.0)
ALT: 34 U/L (ref 0–44)
AST: 54 U/L — ABNORMAL HIGH (ref 15–41)
Alkaline Phosphatase: 54 U/L (ref 38–126)
Anion gap: 15 (ref 5–15)
BUN: 12 mg/dL (ref 6–20)
CHLORIDE: 101 mmol/L (ref 98–111)
CO2: 18 mmol/L — AB (ref 22–32)
Calcium: 9.1 mg/dL (ref 8.9–10.3)
Creatinine, Ser: 0.74 mg/dL (ref 0.44–1.00)
GFR calc Af Amer: >60 mL/min (ref >60)
GFR calc non Af Amer: >60 mL/min (ref >60)
GLUCOSE: 126 mg/dL — AB (ref 70–99)
POTASSIUM: 3.3 mmol/L — AB (ref 3.5–5.1)
SODIUM: 134 mmol/L — AB (ref 135–145)
Total Bilirubin: 0.3 mg/dL (ref 0.3–1.2)
Total Protein: 8.2 g/dL — ABNORMAL HIGH (ref 6.5–8.1)

## 2018-07-08 LAB — CBC WITH DIFFERENTIAL/PLATELET
Abs Immature Granulocytes: 0.03 10*3/uL (ref 0.00–0.07)
Basophils Absolute: 0 10*3/uL (ref 0.0–0.1)
Basophils Relative: 0 %
Eosinophils Absolute: 0.1 10*3/uL (ref 0.0–0.5)
Eosinophils Relative: 2 %
HCT: 36.5 % (ref 36.0–46.0)
Hemoglobin: 11.8 g/dL — ABNORMAL LOW (ref 12.0–15.0)
Immature Granulocytes: 1 %
Lymphocytes Relative: 42 %
Lymphs Abs: 2.5 10*3/uL (ref 0.7–4.0)
MCH: 28.4 pg (ref 26.0–34.0)
MCHC: 32.3 g/dL (ref 30.0–36.0)
MCV: 87.7 fL (ref 80.0–100.0)
Monocytes Absolute: 0.3 10*3/uL (ref 0.1–1.0)
Monocytes Relative: 5 %
Neutro Abs: 2.9 10*3/uL (ref 1.7–7.7)
Neutrophils Relative %: 50 %
Platelets: 214 10*3/uL (ref 150–400)
RBC: 4.16 MIL/uL (ref 3.87–5.11)
RDW: 13.1 % (ref 11.5–15.5)
WBC: 5.9 10*3/uL (ref 4.0–10.5)
nRBC: 0 % (ref 0.0–0.2)

## 2018-07-08 LAB — URINE DRUG SCREEN, QUALITATIVE (ARMC ONLY)
Amphetamines, Ur Screen: NOT DETECTED
Barbiturates, Ur Screen: NOT DETECTED
Benzodiazepine, Ur Scrn: POSITIVE — AB
Cannabinoid 50 Ng, Ur ~~LOC~~: POSITIVE — AB
Cocaine Metabolite,Ur ~~LOC~~: NOT DETECTED
MDMA (Ecstasy)Ur Screen: NOT DETECTED
Methadone Scn, Ur: POSITIVE — AB
Opiate, Ur Screen: NOT DETECTED
Phencyclidine (PCP) Ur S: NOT DETECTED
Tricyclic, Ur Screen: POSITIVE — AB

## 2018-07-08 LAB — ACETAMINOPHEN LEVEL: Acetaminophen (Tylenol), Serum: <10 ug/mL — ABNORMAL LOW (ref 10–30)

## 2018-07-08 LAB — ETHANOL

## 2018-07-08 LAB — SALICYLATE LEVEL: Salicylate Lvl: <7 mg/dL (ref 2.8–30.0)

## 2018-07-08 LAB — PREGNANCY, URINE: Preg Test, Ur: NEGATIVE

## 2018-07-08 MED ORDER — MAGNESIUM HYDROXIDE 400 MG/5ML PO SUSP
30.0000 mL | Freq: Every day | ORAL | Status: DC | PRN
  Administered 2018-07-10: 30 mL via ORAL
  Filled 2018-07-08: qty 30

## 2018-07-08 MED ORDER — METHADONE HCL 10 MG PO TABS
75.0000 mg | ORAL_TABLET | Freq: Every day | ORAL | Status: DC
  Administered 2018-07-08: 75 mg via ORAL

## 2018-07-08 MED ORDER — METHADONE HCL 10 MG/ML PO CONC: 75.0000 mg | Freq: Every day | ORAL | Status: DC

## 2018-07-08 MED ORDER — METHADONE HCL 10 MG PO TABS
75.0000 mg | ORAL_TABLET | Freq: Every day | ORAL | Status: DC
  Filled 2018-07-08: qty 8

## 2018-07-08 MED ORDER — ALUM & MAG HYDROXIDE-SIMETH 200-200-20 MG/5ML PO SUSP: 30.0000 mL | ORAL | Status: DC | PRN

## 2018-07-08 MED ORDER — ACETAMINOPHEN 325 MG PO TABS
650.0000 mg | ORAL_TABLET | Freq: Four times a day (QID) | ORAL | Status: DC | PRN
  Administered 2018-07-08 – 2018-07-09 (×2): 650 mg via ORAL
  Filled 2018-07-08 (×2): qty 2

## 2018-07-08 MED ORDER — TRAZODONE HCL 100 MG PO TABS
100.0000 mg | ORAL_TABLET | Freq: Every evening | ORAL | Status: DC | PRN
  Administered 2018-07-08 – 2018-07-10 (×2): 100 mg via ORAL
  Filled 2018-07-08: qty 1

## 2018-07-08 MED ORDER — METHADONE HCL 10 MG PO TABS: 75.0000 mg | ORAL_TABLET | Freq: Every day | ORAL | Status: DC

## 2018-07-08 NOTE — ED Notes (Signed)
Pt dressed out by this tech and nurse brooke. Belongings: grey shirt, blue pants, underwear, white socks.

## 2018-07-08 NOTE — ED Triage Notes (Signed)
Pt presents to ED via [facility] with c/o overdose. Pt family reported to EMS that pt overdosed on zanyx, methadone, and tylenol PM. Pt was reported to have been combative with EMS, 5mg  Haldol and 2 mg ativan given in route to hospital. Family reported to ems that pt may have had possible seizure. Tachy in the 140s for EMS. Pt lethargic upon arrival, but responding to voice.

## 2018-07-08 NOTE — Consult Note (Signed)
Psychiatry: Brief note full note to follow.  Patient meets criteria for inpatient hospitalization.  Consult to TTS.  Case reviewed with ER doctor.  Orders will be written.

## 2018-07-08 NOTE — Consult Note (Signed)
[facility] Face-to-Face Psychiatry Consult   Reason for Consult: Consult for this 27 year old woman brought into the hospital after overdosing on multiple substances Referring Physician: Paduchowski Patient Identification: Tara Raymond MRN:  [id number] Principal Diagnosis: Severe major depression, single episode, without psychotic features (HCC) Diagnosis:  Principal Problem:   Severe major depression, single episode, without psychotic features (HCC) Active Problems:   Opioid use disorder, severe, dependence (HCC)   Suicidal ideation   Overdose of benzodiazepine   Total Time spent with patient: 1 hour  Subjective:   Tara Raymond is a 27 y.o. female patient admitted with "I do not really remember".  HPI: Patient seen chart reviewed.  Patient brought into the hospital with altered mental status.  Report of having taken an overdose of Xanax on top of her chronic methadone.  Concern also about overdose of acetaminophen although her acetaminophen level has not been elevated.  Patient tells me that she has no memory of taking the overdose.  Last thing she remembers was being at her home.  She does however admit that yesterday was a very bad day.  She was feeling very depressed.  Admits to having suicidal thoughts and admits to having thought about taking an overdose.  Patient says her mood has been depressed for months and is getting worse.  Sleep is very poor.  She is not enjoying any activities.  Barely gets out of the house.  Not eating well.  Patient is smoking marijuana regularly and also using Xanax off the street.  She is not getting back into illegal opiates but is staying on her methadone and goes to the methadone clinic.  Not currently taking any medicine for depression or getting any treatment for mood symptoms.  Patient says the major stressor right now is that she had a miscarriage about 3 months ago.  Has been sad and crying and upset ever since then.  Feels hopeless and down.  Does not  report any hallucinations or psychotic symptoms.  Patient does have insight and thinks she needs treatment.  Medical history: Patient took an overdose of Xanax on top of methadone.  Unclear what else she might have taken.  Medically stabilizing.  Has a history of several pregnancies and C-sections in the past.  Substance abuse history: History of opiate abuse now attending the methadone clinic and says that since taking her methadone she is not getting back on the narcotics although she does take Xanax off the street and smokes marijuana.  Denies alcohol abuse.  Social history: Patient is living with her mother.  She has a couple of children living but neither of them are in her custody.  Past Psychiatric History: Patient has been seen several times before by mental health but usually in the context of opiate abuse and withdrawal.  Unclear if she is ever been on any psychiatric medicine in the past.  Patient denies ever having tried to kill herself in the past.  Previous suicidal ideation usually just in the context of intoxication.  Risk to Self:   Risk to Others:   Prior Inpatient Therapy:   Prior Outpatient Therapy:    Past Medical History:  Past Medical History:  Diagnosis Date  . Anemia   . Cocaine use disorder (HCC)   . Dyspnea    with pregnancy  . Mental disorder   . Opioid use disorder, moderate, dependence (HCC)   . Substance induced mood disorder [facility](HCC)     Past Surgical History:  Procedure Laterality Date  .  CESAREAN SECTION N/A 02/09/2016   Procedure: CESAREAN SECTION;  Surgeon: Nadara Mustard, MD;  Location: [facility] ORS;  Service: Obstetrics;  Laterality: N/A;  . CESAREAN SECTION N/A 04/20/2017   Procedure: CESAREAN SECTION;  Surgeon: Vena Austria, MD;  Location: [facility] ORS;  Service: Obstetrics;  Laterality: N/A;   Family History:  Family History  Problem Relation Age of Onset  . Drug abuse Mother    Family Psychiatric  History: Positive for substance abuse Social  History:  Social History   Substance and Sexual Activity  Alcohol Use No     Social History   Substance and Sexual Activity  Drug Use Yes  . Frequency: 2.0 times per week  . Types: Cocaine, Marijuana, Heroin   Comment: Been in detox    Social History   Socioeconomic History  . Marital status: Single    Spouse name: Not on file  . Number of children: Not on file  . Years of education: Not on file  . Highest education level: Not on file  Occupational History  . Not on file  Social Needs  . Financial resource strain: Not on file  . Food insecurity:    Worry: Not on file    Inability: Not on file  . Transportation needs:    Medical: Not on file    Non-medical: Not on file  Tobacco Use  . Smoking status: Current Every Day Smoker    Packs/day: 0.50    Types: Cigarettes  . Smokeless tobacco: Current User  Substance and Sexual Activity  . Alcohol use: No  . Drug use: Yes    Frequency: 2.0 times per week    Types: Cocaine, Marijuana, Heroin    Comment: Been in detox  . Sexual activity: Yes  Lifestyle  . Physical activity:    Days per week: Not on file    Minutes per session: Not on file  . Stress: Not on file  Relationships  . Social connections:    Talks on phone: Not on file    Gets together: Not on file    Attends religious service: Not on file    Active member of club or organization: Not on file    Attends meetings of clubs or organizations: Not on file    Relationship status: Not on file  Other Topics Concern  . Not on file  Social History Narrative  . Not on file   Additional Social History:    Allergies:   Allergies  Allergen Reactions  . Naproxen Hives    Labs:  Results for orders placed or performed during the hospital encounter of 07/08/18 (from the past 48 hour(s))  Comprehensive metabolic panel     Status: Abnormal   Collection Time: 07/08/18 12:53 AM  Result Value Ref Range   Sodium 134 (L) 135 - 145 mmol/L   Potassium 3.3 (L) 3.5 -  5.1 mmol/L   Chloride 101 98 - 111 mmol/L   CO2 18 (L) 22 - 32 mmol/L   Glucose, Bld 126 (H) 70 - 99 mg/dL   BUN 12 6 - 20 mg/dL   Creatinine, Ser 0.34 0.44 - 1.00 mg/dL   Calcium 9.1 8.9 - 74.2 mg/dL   Total Protein 8.2 (H) 6.5 - 8.1 g/dL   Albumin 4.3 3.5 - 5.0 g/dL   AST 54 (H) 15 - 41 U/L   ALT 34 0 - 44 U/L    Comment: RESULT CONFIRMED BY MANUAL DILUTION [facility]   Alkaline Phosphatase 54 38 - 126  U/L   Total Bilirubin 0.3 0.3 - 1.2 mg/dL   GFR calc non Af Amer >60 >60 mL/min   GFR calc Af Amer >60 >60 mL/min   Anion gap 15 5 - 15    Comment: Performed at [facility], [address] [place], [place] [address]  Salicylate level     Status: None   Collection Time: 07/08/18 12:53 AM  Result Value Ref Range   Salicylate Lvl <7.0 2.8 - 30.0 mg/dL    Comment: Performed at [facility], [address] [place], [place] [address]  Acetaminophen level     Status: Abnormal   Collection Time: 07/08/18 12:53 AM  Result Value Ref Range   Acetaminophen (Tylenol), Serum <10 (L) 10 - 30 ug/mL    Comment: (NOTE) Therapeutic concentrations vary significantly. A range of 10-30 ug/mL  may be an effective concentration for many patients. However, some  are best treated at concentrations outside of this range. Acetaminophen concentrations >150 ug/mL at 4 hours after ingestion  and >50 ug/mL at 12 hours after ingestion are often associated with  toxic reactions. Performed at [facility], [address] [place], [place] [address]   Ethanol     Status: None   Collection Time: 07/08/18 12:53 AM  Result Value Ref Range   Alcohol, Ethyl (B) <10 <10 mg/dL    Comment: (NOTE) Lowest detectable limit for serum alcohol is 10 mg/dL. For medical purposes only. Performed at [facility], [address] [place], [place] [address]   CBC WITH DIFFERENTIAL     Status: Abnormal   Collection Time: 07/08/18 12:53 AM  Result Value Ref Range   WBC 5.9 4.0 - 10.5 K/uL    RBC 4.16 3.87 - 5.11 MIL/uL   Hemoglobin 11.8 (L) 12.0 - 15.0 g/dL   HCT 29.5 62.1 - 30.8 %   MCV 87.7 80.0 - 100.0 fL   MCH 28.4 26.0 - 34.0 pg   MCHC 32.3 30.0 - 36.0 g/dL   RDW 65.7 84.6 - 96.2 %   Platelets 214 150 - 400 K/uL   nRBC 0.0 0.0 - 0.2 %   Neutrophils Relative % 50 %   Neutro Abs 2.9 1.7 - 7.7 K/uL   Lymphocytes Relative 42 %   Lymphs Abs 2.5 0.7 - 4.0 K/uL   Monocytes Relative 5 %   Monocytes Absolute 0.3 0.1 - 1.0 K/uL   Eosinophils Relative 2 %   Eosinophils Absolute 0.1 0.0 - 0.5 K/uL   Basophils Relative 0 %   Basophils Absolute 0.0 0.0 - 0.1 K/uL   Immature Granulocytes 1 %   Abs Immature Granulocytes 0.03 0.00 - 0.07 K/uL    Comment: Performed at [facility], [address] [place], [place] [address]  Urine Drug Screen, Qualitative     Status: Abnormal   Collection Time: 07/08/18  3:43 AM  Result Value Ref Range   Tricyclic, Ur Screen POSITIVE (A) NONE DETECTED   Amphetamines, Ur Screen NONE DETECTED NONE DETECTED   MDMA (Ecstasy)Ur Screen NONE DETECTED NONE DETECTED   Cocaine Metabolite,Ur [place] NONE DETECTED NONE DETECTED   Opiate, Ur Screen NONE DETECTED NONE DETECTED   Phencyclidine (PCP) Ur S NONE DETECTED NONE DETECTED   Cannabinoid 50 Ng, Ur [place] POSITIVE (A) NONE DETECTED   Barbiturates, Ur Screen NONE DETECTED NONE DETECTED   Benzodiazepine, Ur Scrn POSITIVE (A) NONE DETECTED   Methadone Scn, Ur POSITIVE (A) NONE DETECTED    Comment: (NOTE) Tricyclics + metabolites,  urine    Cutoff 1000 ng/mL Amphetamines + metabolites, urine  Cutoff 1000 ng/mL MDMA (Ecstasy), urine              Cutoff 500 ng/mL Cocaine Metabolite, urine          Cutoff 300 ng/mL Opiate + metabolites, urine        Cutoff 300 ng/mL Phencyclidine (PCP), urine         Cutoff 25 ng/mL Cannabinoid, urine                 Cutoff 50 ng/mL Barbiturates + metabolites, urine  Cutoff 200 ng/mL Benzodiazepine, urine              Cutoff 200 ng/mL Methadone, urine                    Cutoff 300 ng/mL The urine drug screen provides only a preliminary, unconfirmed analytical test result and should not be used for non-medical purposes. Clinical consideration and professional judgment should be applied to any positive drug screen result due to possible interfering substances. A more specific alternate chemical method must be used in order to obtain a confirmed analytical result. Gas chromatography / mass spectrometry (GC/MS) is the preferred confirmat ory method. Performed at [facility] [address] [place], [place] [address]   Pregnancy, urine     Status: None   Collection Time: 07/08/18  3:43 AM  Result Value Ref Range   Preg Test, Ur NEGATIVE NEGATIVE    Comment: Performed at [facility], [address] [place], [place] [address]    No current facility-administered medications for this encounter.    No current outpatient medications on file.    Musculoskeletal: Strength & Muscle Tone: within normal limits Gait & Station: normal Patient leans: N/A  Psychiatric Specialty Exam: Physical Exam  Nursing note and vitals reviewed. Constitutional: She appears well-developed and well-nourished.  HENT:  Head: Normocephalic and atraumatic.  Eyes: Pupils are equal, round, and reactive to light. Conjunctivae are normal.  Neck: Normal range of motion.  Cardiovascular: Regular rhythm and normal heart sounds.  Respiratory: No respiratory distress.  GI: Soft.  Musculoskeletal: Normal range of motion.  Neurological: She is alert.  Skin: Skin is warm and dry.  Psychiatric: Her speech is delayed. She is slowed. Cognition and memory are impaired. She expresses impulsivity. She exhibits a depressed mood. She expresses suicidal ideation. She expresses suicidal plans. She exhibits abnormal recent memory.    Review of Systems  Constitutional: Negative.   HENT: Negative.   Eyes: Negative.   Respiratory: Negative.   Cardiovascular: Negative.    Gastrointestinal: Negative.   Musculoskeletal: Negative.   Skin: Negative.   Neurological: Negative.   Psychiatric/Behavioral: Positive for depression, memory loss, substance abuse and suicidal ideas. Negative for hallucinations. The patient is nervous/anxious and has insomnia.     Blood pressure 116/81, pulse 83, temperature (!) 97.2 F (36.2 C), temperature source Axillary, resp. rate 16, height 4\' 8"  (1.422 m), weight 49.5 kg, SpO2 98 %, unknown if currently breastfeeding.Body mass index is 24.47 kg/m.  General Appearance: Disheveled  Eye Contact:  Fair  Speech:  Slow  Volume:  Decreased  Mood:  Depressed  Affect:  Depressed  Thought Process:  Coherent  Orientation:  Full (Time, Place, and Person)  Thought Content:  Logical  Suicidal Thoughts:  Yes.  with intent/plan  Homicidal Thoughts:  No  Memory:  Immediate;   Fair Recent;   Poor Remote;  Fair  Judgement:  Fair  Insight:  Fair  Psychomotor Activity:  Decreased  Concentration:  Concentration: Fair  Recall:  Poor  Fund of Knowledge:  Fair  Language:  Fair  Akathisia:  No  Handed:  Right  AIMS (if indicated):     Assets:  Desire for Improvement Housing Physical Health Social Support  ADL's:  Impaired  Cognition:  Impaired,  Mild  Sleep:        Treatment Plan Summary: Daily contact with patient to assess and evaluate symptoms and progress in treatment, Medication management and Plan Patient took a serious overdose with suicidal intent.  Multiple symptoms of major depression.  Not receiving outpatient treatment.  No evidence of psychosis.  Patient is abusing substances but has been able to stay off of opiates with the help of methadone.  Patient is showing insight and agrees that she needs hospitalization for treatment.  Plan will be to admit her to the psychiatric unit.  Continue methadone.  We will monitor to see if she needs any medicine for withdrawal from benzodiazepines.  Patient will be engaged in individual  and group therapy and decisions will be made about appropriate treatment of depressive symptoms.  Disposition: Recommend psychiatric Inpatient admission when medically cleared. Supportive therapy provided about ongoing stressors.  Mordecai RasmussenJohn Dellar Traber, MD 07/08/2018 2:21 PM

## 2018-07-08 NOTE — ED Notes (Signed)
Attempted to call report. Informed that they were already getting report on a patient and that they would need me to call back in an hour before they could take report. Will inform Charge RN Jeannett SeniorStephen

## 2018-07-08 NOTE — ED Notes (Signed)
Family at bedside, mom and aunt

## 2018-07-08 NOTE — ED Notes (Signed)
PT IVC/ PENDING PLACEMENT  

## 2018-07-08 NOTE — BH Assessment (Signed)
[facility] by Dr. Toni Amendlapacs.  Attending Physician will be Dr. Jennet MaduroPucilowska.   Patient has been assigned to room 320, by [facility] Charge Nurse Shatara.   ER staff is aware of the admission:  Misty StanleyLisa, ER Secretary    Dr. Marisa SeverinSiadecki, ER MD   Lelon MastSamantha, Patient's Nurse   Lupita DawnGwen S.,  Patient Access.

## 2018-07-08 NOTE — ED Provider Notes (Signed)
[facility] Emergency Department Provider Note   First MD Initiated Contact with Patient 07/08/18 [phone]     (approximate)  I have reviewed the triage vital signs and the nursing notes.  Level 5 caveat: History and review of system limited secondary to altered mental status on arrival. HISTORY  Chief Complaint Drug Overdose    HPI Tara Raymond is a 27 y.o. female with below list of chronic medical conditions including polysubstance abuse presents to the emergency department via EMS with reported overdose per EMS staff.  EMS staff states that patient overdosed on Xanax methadone and Tylenol PM.  EMS staff also states that while in route to the emergency department the patient was markedly combative and as such they administered 5 mg of Haldol and 2 mg of Versed while in route.   Past Medical History:  Diagnosis Date  . Anemia   . Cocaine use disorder (HCC)   . Dyspnea    with pregnancy  . Mental disorder   . Opioid use disorder, moderate, dependence (HCC)   . Substance induced mood disorder [facility])     Patient Active Problem List   Diagnosis Date Noted  . Alcohol use disorder, moderate, dependence (HCC) 04/03/2018  . Substance abuse (HCC) 04/02/2018  . Encounter for maternal care for low transverse scar from previous cesarean delivery 04/20/2017  . Indication for care in labor or delivery 04/17/2017  . Anemia associated with acute blood loss 02/13/2016  . Postpartum care following cesarean delivery 02/12/2016  . Major depression, single episode 02/12/2016  . Postoperative anemia due to acute blood loss 02/12/2016  . Adjustment disorder with depressed mood 02/11/2016  . Substance abuse affecting pregnancy in third trimester, antepartum 02/09/2016  . Labor and delivery, indication for care 02/03/2016  . Substance induced mood disorder (HCC) 01/28/2016  . Severe major depression, single episode, without psychotic features (HCC) 01/28/2016  . Cocaine  use disorder, moderate, dependence (HCC) 01/28/2016  . Opioid use disorder, severe, dependence (HCC) 01/28/2016  . Opiate withdrawal (HCC) 01/28/2016    Past Surgical History:  Procedure Laterality Date  . CESAREAN SECTION N/A 02/09/2016   Procedure: CESAREAN SECTION;  Surgeon: Nadara Mustard, MD;  Location: [facility] ORS;  Service: Obstetrics;  Laterality: N/A;  . CESAREAN SECTION N/A 04/20/2017   Procedure: CESAREAN SECTION;  Surgeon: Vena Austria, MD;  Location: [facility] ORS;  Service: Obstetrics;  Laterality: N/A;    Prior to Admission medications   Not on File    Allergies Naproxen  Family History  Problem Relation Age of Onset  . Drug abuse Mother     Social History Social History   Tobacco Use  . Smoking status: Current Every Day Smoker    Packs/day: 0.50    Types: Cigarettes  . Smokeless tobacco: Current User  Substance Use Topics  . Alcohol use: No  . Drug use: Yes    Frequency: 2.0 times per week    Types: Cocaine, Marijuana, Heroin    Comment: Been in detox    Review of Systems Constitutional: No fever/chills Eyes: No visual changes. ENT: No sore throat. Cardiovascular: Denies chest pain. Respiratory: Denies shortness of breath. Gastrointestinal: No abdominal pain.  No nausea, no vomiting.  No diarrhea.  No constipation. Genitourinary: Negative for dysuria. Musculoskeletal: Negative for neck pain.  Negative for back pain. Integumentary: Negative for rash. Neurological: Negative for headaches, focal weakness or numbness. Psychiatric:Positive for reported overdose.   ____________________________________________   PHYSICAL EXAM:  VITAL SIGNS: ED Triage Vitals  Enc Vitals Group     BP 07/08/18 0051 107/67     Pulse Rate 07/08/18 0051 89     Resp 07/08/18 0051 (!) 23     Temp 07/08/18 0055 (!) 97.2 F (36.2 C)     Temp Source 07/08/18 0055 Axillary     SpO2 07/08/18 0051 96 %     Weight 07/08/18 0051 49.5 kg (109 lb 2 oz)     Height 07/08/18  0050 1.422 m (4\' 8" )     Head Circumference --      Peak Flow --      Pain Score 07/08/18 0050 0     Pain Loc --      Pain Edu? --      Excl. in GC? --     Constitutional: Somnolent but arousable to verbal stimuli. Eyes: Conjunctivae are normal. PERRL. EOMI. Mouth/Throat: Mucous membranes are moist.  Oropharynx non-erythematous. Neck: No stridor.   Cardiovascular: Normal rate, regular rhythm. Good peripheral circulation. Grossly normal heart sounds. Respiratory: Normal respiratory effort.  No retractions. Lungs CTAB. Gastrointestinal: Soft and nontender. No distention.   Musculoskeletal: No lower extremity tenderness nor edema. No gross deformities of extremities. Neurologic: Slurred nonsensical speech no gross focal neurologic deficits are appreciated.  Skin:  Skin is warm, dry and intact. No rash noted.   ____________________________________________   LABS (all labs ordered are listed, but only abnormal results are displayed)  Labs Reviewed  COMPREHENSIVE METABOLIC PANEL - Abnormal; Notable for the following components:      Result Value   Sodium 134 (*)    Potassium 3.3 (*)    CO2 18 (*)    Glucose, Bld 126 (*)    Total Protein 8.2 (*)    AST 54 (*)    All other components within normal limits  ACETAMINOPHEN LEVEL - Abnormal; Notable for the following components:   Acetaminophen (Tylenol), Serum <10 (*)    All other components within normal limits  URINE DRUG SCREEN, QUALITATIVE ([facility] ONLY) - Abnormal; Notable for the following components:   Tricyclic, Ur Screen POSITIVE (*)    Cannabinoid 50 Ng, Ur [place] POSITIVE (*)    Benzodiazepine, Ur Scrn POSITIVE (*)    Methadone Scn, Ur POSITIVE (*)    All other components within normal limits  CBC WITH DIFFERENTIAL/PLATELET - Abnormal; Notable for the following components:   Hemoglobin 11.8 (*)    All other components within normal limits  SALICYLATE LEVEL  ETHANOL  CBG MONITORING, ED  POC URINE PREG, ED    ____________________________________________  EKG  ED ECG REPORT I, [place] N [place], the attending physician, personally viewed and interpreted this ECG.   Date: 07/08/2018  EKG Time: 12:52 AM  Rate: 82  Rhythm: Normal sinus rhythm  Axis: Normal  Intervals: Normal  ST&T Change: None    Procedures   ____________________________________________   INITIAL IMPRESSION / ASSESSMENT AND PLAN / ED COURSE  As part of my medical decision making, I reviewed the following data within the electronic MEDICAL RECORD NUMBER 27 year old female presenting with above-stated (via EMS) history and physical exam concern for drug overdose.  After patient was in the emergency department her mother arrived to the ED and stated that the patient does receive methadone from the clinic however that she "got into some Xanax and that her daughter has had recent depression secondary to the fact that she is separated from her children.  Patient now more awake however still somnolent stating that she did not attempt to  kill herself".  Patient awaiting psychiatry evaluation at this time ____________________________________________  FINAL CLINICAL IMPRESSION(S) / ED DIAGNOSES  Final diagnoses:  Depression, unspecified depression type  Drug overdose, undetermined intent, initial encounter     MEDICATIONS GIVEN DURING THIS VISIT:  Medications - No data to display   ED Discharge Orders    None       Note:  This document was prepared using Dragon voice recognition software and may include unintentional dictation errors.    Darci CurrentBrown, [place], MD 07/08/18 [id number]

## 2018-07-08 NOTE — ED Notes (Signed)
Called Pharmacy and spoke with Nate and inquired about medication being changed to solution from prior tablet ordered. Nate states he will find out and call me back

## 2018-07-08 NOTE — ED Notes (Signed)
Pt given meal tray by EDT Sunny SchleinFelicia

## 2018-07-08 NOTE — ED Notes (Signed)
Patient is using the phone at this time, she states " I want to leave, and nurse told her that she had talk to the doctor first' . Patient agreed, she is calm at this time.

## 2018-07-08 NOTE — BH Assessment (Signed)
Assessment Note  Tara Raymond is an 27 y.o. female who presents to the ER due to withdrawals from her methadone. She states she was trying to tapered herself off of it. She further reports, due to the detox, her mood has changed and she's feeling depressed. Patient lives with her mother and the mother had concerns about the withdrawal symptoms and her current emotional and mental state.   During the interview, the patient was calm, cooperative and pleasant. She was able to provide appropriate answers to the questions. She denies SI/HI and AV/H.  She reports of having some nausea, muscle cramps and pain. While she was talking she was sweating and unable to remain still.   Diagnosis: Depression  Past Medical History:  Past Medical History:  Diagnosis Date  . Anemia   . Cocaine use disorder (HCC)   . Dyspnea    with pregnancy  . Mental disorder   . Opioid use disorder, moderate, dependence (HCC)   . Substance induced mood disorder [facility])     Past Surgical History:  Procedure Laterality Date  . CESAREAN SECTION N/A 02/09/2016   Procedure: CESAREAN SECTION;  Surgeon: Nadara Mustard, MD;  Location: [facility] ORS;  Service: Obstetrics;  Laterality: N/A;  . CESAREAN SECTION N/A 04/20/2017   Procedure: CESAREAN SECTION;  Surgeon: Vena Austria, MD;  Location: [facility] ORS;  Service: Obstetrics;  Laterality: N/A;    Family History:  Family History  Problem Relation Age of Onset  . Drug abuse Mother     Social History:  reports that she has been smoking cigarettes. She has been smoking about 0.50 packs per day. She uses smokeless tobacco. She reports current drug use. Frequency: 2.00 times per week. Drugs: Cocaine, Marijuana, and Heroin. She reports that she does not drink alcohol.  Additional Social History:  Alcohol / Drug Use Pain Medications: See PTA Prescriptions: See PTA Over the Counter: See PTA History of alcohol / drug use?: Yes Longest period of sobriety (when/how long): Unable  to quantify Negative Consequences of Use: Personal relationships, Work / School Substance #1 Name of Substance 1: Cannabis 1 - Age of First Use: Unable to quantify 1 - Amount (size/oz): Unable to quantify 1 - Frequency: Daily 1 - Duration: "All my life" 1 - Last Use / Amount: 07/07/2018  CIWA: CIWA-Ar BP: 108/74 Pulse Rate: 85 COWS: Clinical Opiate Withdrawal Scale (COWS) Resting Pulse Rate: Pulse Rate 80 or below Sweating: Subjective report of chills or flushing Restlessness: Able to sit still Pupil Size: Pupils pinned or normal size for room light Bone or Joint Aches: Patient reports sever diffuse aching of joints/muscles Runny Nose or Tearing: Not present GI Upset: No GI symptoms Tremor: Slight tremor observable Yawning: No yawning Anxiety or Irritability: Patient reports increasing irritability or anxiousness Gooseflesh Skin: Skin is smooth COWS Total Score: 6  Allergies:  Allergies  Allergen Reactions  . Naproxen Hives    Home Medications: (Not in a hospital admission)   OB/GYN Status:  No LMP recorded.  General Assessment Data Location of Assessment: [facility] ED TTS Assessment: In system Is this a Tele or Face-to-Face Assessment?: Face-to-Face Is this an Initial Assessment or a Re-assessment for this encounter?: Initial Assessment Language Other than English: No Living Arrangements: Other (Comment)(Private Home) What gender do you identify as?: Female Marital status: Single Pregnancy Status: No Living Arrangements: Other relatives Can pt return to current living arrangement?: No Admission Status: Involuntary Petitioner: ED Attending Is patient capable of signing voluntary admission?: No(Under IVC) Referral Source:  Self/Family/Friend Insurance type: None  Medical Screening Exam [facility] Walk-[facility] ONLY) Medical Exam completed: Yes  Crisis Care Plan Living Arrangements: Other relatives Legal Guardian: Other:(Self) Name of Psychiatrist: [facility] Name of  Therapist: [facility]  Education Status Is patient currently in school?: No Is the patient employed, unemployed or receiving disability?: Unemployed  Risk to self with the past 6 months Suicidal Ideation: No Has patient been a risk to self within the past 6 months prior to admission? : No Suicidal Intent: No Has patient had any suicidal intent within the past 6 months prior to admission? : No Is patient at risk for suicide?: No Suicidal Plan?: No Has patient had any suicidal plan within the past 6 months prior to admission? : No Access to Means: No What has been your use of drugs/alcohol within the last 12 months?: Cannabis Previous Attempts/Gestures: No How many times?: 0 Other Self Harm Risks: Active Drug Use Triggers for Past Attempts: None known Intentional Self Injurious Behavior: None Family Suicide History: No Recent stressful life event(s): Financial Problems, Turmoil (Comment), Other (Comment) Persecutory voices/beliefs?: No Depression: Yes Depression Symptoms: Isolating, Fatigue, Guilt, Feeling worthless/self pity Substance abuse history and/or treatment for substance abuse?: Yes Suicide prevention information given to non-admitted patients: Not applicable  Risk to Others within the past 6 months Homicidal Ideation: No Does patient have any lifetime risk of violence toward others beyond the six months prior to admission? : No Thoughts of Harm to Others: No Current Homicidal Intent: No Current Homicidal Plan: No Access to Homicidal Means: No Identified Victim: Reports of none History of harm to others?: No Assessment of Violence: None Noted Violent Behavior Description: Reports of none Does patient have access to weapons?: No Criminal Charges Pending?: No Does patient have a court date: No Is patient on probation?: No  Psychosis Hallucinations: None noted Delusions: None noted  Mental Status Report Appearance/Hygiene: Unremarkable, In scrubs Eye Contact:  Fair Motor Activity: Freedom of movement, Unremarkable Speech: Logical/coherent, Unremarkable Level of Consciousness: Alert Mood: Depressed, Anxious, Sad, Pleasant Affect: Appropriate to circumstance, Depressed, Sad Anxiety Level: Minimal Thought Processes: Coherent, Relevant Judgement: Unimpaired Orientation: Person, Place, Time, Situation, Appropriate for developmental age Obsessive Compulsive Thoughts/Behaviors: Minimal  Cognitive Functioning Concentration: Normal Memory: Recent Intact, Remote Intact Is patient IDD: No Insight: Fair Impulse Control: Good Appetite: Fair Have you had any weight changes? : No Change Sleep: Decreased Total Hours of Sleep: 2 Vegetative Symptoms: None  ADLScreening [facility] Assessment Services) Patient's cognitive ability adequate to safely complete daily activities?: Yes Patient able to express need for assistance with ADLs?: Yes Independently performs ADLs?: Yes (appropriate for developmental age)  Prior Inpatient Therapy Prior Inpatient Therapy: Yes Prior Therapy Dates: 02/2016 [facility] Reason for Treatment: Depression  Prior Outpatient Therapy Prior Outpatient Therapy: Yes Prior Therapy Dates: Current Prior Therapy Facilty/Provider(s): [facility] Reason for Treatment: Methadone Treatment Does patient have an ACCT team?: No Does patient have Intensive In-House Services?  : No Does patient have [facility] services? : No Does patient have P4CC services?: No  ADL Screening (condition at time of admission) Patient's cognitive ability adequate to safely complete daily activities?: Yes Is the patient deaf or have difficulty hearing?: No Does the patient have difficulty seeing, even when wearing glasses/contacts?: No Does the patient have difficulty concentrating, remembering, or making decisions?: No Patient able to express need for assistance with ADLs?: Yes Does the patient have difficulty dressing or  bathing?: No Independently performs ADLs?: Yes (appropriate for developmental age) Does the patient have  difficulty walking or climbing stairs?: No Weakness of Legs: None Weakness of Arms/Hands: None  Home Assistive Devices/Equipment Home Assistive Devices/Equipment: None  Therapy Consults (therapy consults require a physician order) PT Evaluation Needed: No OT Evalulation Needed: No SLP Evaluation Needed: No Abuse/Neglect Assessment (Assessment to be complete while patient is alone) Abuse/Neglect Assessment Can Be Completed: Yes Physical Abuse: Denies Verbal Abuse: Denies Sexual Abuse: Denies Exploitation of patient/patient's resources: Denies Self-Neglect: Denies Values / Beliefs Cultural Requests During Hospitalization: None Spiritual Requests During Hospitalization: None Consults Spiritual Care Consult Needed: No Social Work Consult Needed: No Merchant navy officerAdvance Directives (For Healthcare) Does Patient Have a Medical Advance Directive?: No       Child/Adolescent Assessment Running Away Risk: Denies(Patient is an adult)  Disposition:  Disposition Initial Assessment Completed for this Encounter: Yes  On Site Evaluation by:   Reviewed with Physician:    Lilyan Gilfordalvin J. Meghan Warshawsky MS, LCAS, LPC, NCC, CCSI Therapeutic Triage Specialist 07/08/2018 4:17 PM

## 2018-07-08 NOTE — ED Notes (Signed)
Cory in lab to add on urine pregnancy to urine already in lab.

## 2018-07-08 NOTE — ED Notes (Signed)
Pt given supper meal tray. 

## 2018-07-08 NOTE — ED Notes (Signed)
Attempted to call report again per Charge instructions. Informed that they need an hour before they can take report on patient. Will relay new information to [employer]

## 2018-07-09 DIAGNOSIS — F333 Major depressive disorder, recurrent, severe with psychotic symptoms: Principal | ICD-10-CM

## 2018-07-09 DIAGNOSIS — F17200 Nicotine dependence, unspecified, uncomplicated: Secondary | ICD-10-CM | POA: Diagnosis present

## 2018-07-09 LAB — HEMOGLOBIN A1C
Hgb A1c MFr Bld: 4.8 % (ref 4.8–5.6)
Mean Plasma Glucose: 91.06 mg/dL

## 2018-07-09 LAB — LIPID PANEL
Cholesterol: 215 mg/dL — ABNORMAL HIGH (ref 0–200)
HDL: 41 mg/dL (ref >40)
LDL CALC: 148 mg/dL — AB (ref 0–99)
TRIGLYCERIDES: 130 mg/dL (ref <150)
Total CHOL/HDL Ratio: 5.2 RATIO
VLDL: 26 mg/dL (ref 0–40)

## 2018-07-09 LAB — TSH: TSH: 1.413 u[IU]/mL (ref 0.350–4.500)

## 2018-07-09 MED ORDER — QUETIAPINE FUMARATE 100 MG PO TABS
100.0000 mg | ORAL_TABLET | Freq: Every day | ORAL | Status: DC
  Administered 2018-07-09: 100 mg via ORAL
  Filled 2018-07-09: qty 1

## 2018-07-09 MED ORDER — FLUOXETINE HCL 20 MG PO CAPS
20.0000 mg | ORAL_CAPSULE | Freq: Every day | ORAL | Status: DC
  Administered 2018-07-09 – 2018-07-11 (×3): 20 mg via ORAL
  Filled 2018-07-09 (×3): qty 1

## 2018-07-09 MED ORDER — METHADONE HCL 10 MG PO TABS
80.0000 mg | ORAL_TABLET | Freq: Every day | ORAL | Status: DC
  Administered 2018-07-09 – 2018-07-11 (×3): 80 mg via ORAL
  Filled 2018-07-09 (×5): qty 8

## 2018-07-09 MED ORDER — CHLORDIAZEPOXIDE HCL 5 MG PO CAPS
5.0000 mg | ORAL_CAPSULE | Freq: Four times a day (QID) | ORAL | Status: DC
  Administered 2018-07-09 – 2018-07-11 (×8): 5 mg via ORAL
  Filled 2018-07-09 (×8): qty 1

## 2018-07-09 MED ORDER — HYDROXYZINE HCL 25 MG PO TABS: 25.0000 mg | ORAL_TABLET | Freq: Three times a day (TID) | ORAL | Status: DC | PRN

## 2018-07-09 NOTE — H&P (Signed)
Psychiatric Admission Assessment Adult  Patient Identification: Tara GuilesOctavia Nicole Raymond MRN:  [id number] Date of Evaluation:  07/09/2018 Chief Complaint:  Severe major depression, single episode, without psychotic features Principal Diagnosis: Severe recurrent major depression with psychotic features (HCC) Diagnosis:  Principal Problem:   Severe recurrent major depression with psychotic features (HCC) Active Problems:   Cocaine use disorder, moderate, dependence (HCC)   Opioid use disorder, mild, in sustained remission, on maintenance therapy (HCC)   Suicidal ideation   Tobacco use disorder  History of Present Illness:   Identifying data. Ms. Eliot FordBriggs is a 27 year old female with a history of psychotic depression.  Chief complaint. "I don't know what has happened."  History of present illness. Information was obtained from the patient and the chart. The patient was brought to the ER for disorganized behavior with hallucinations. The patient does not remember exactly but believes that her symptoms started around Thanksgiving and have gotten worse when she was stressed ou over Christmas holidays. She reports depressed mood, sadness, anhedonia, feeling of worthlessness, crying spells, low energy and concentration. Denies suicidal thinking but her mother reports suicidal thoughts. She report some paranoia, nightmares and flashbacks of PTSD. No panic attacks or OCD. She has a history of opioid abuse and has been on methadone maintenance therapy with success. She was positive for cannabis and benzodiazepine. She buya Xanax off the street.  Past psychiatric history. She was tried on antidepressants and Seroquel in thepast.  Family psychiatric history. Cousins with metal illness.  Social history. Lives with her mother, has Medicaid but no income. Unemployed.   Total Time spent with patient: 1 hour  Is the patient at risk to self? Yes.    Has the patient been a risk to self in the past 6 months?  No.  Has the patient been a risk to self within the distant past? No.  Is the patient a risk to others? No.  Has the patient been a risk to others in the past 6 months? No.  Has the patient been a risk to others within the distant past? No.   Prior Inpatient Therapy:   Prior Outpatient Therapy:    Alcohol Screening: 1. How often do you have a drink containing alcohol?: Monthly or less 2. How many drinks containing alcohol do you have on a typical day when you are drinking?: 1 or 2 3. How often do you have six or more drinks on one occasion?: Never AUDIT-C Score: 1 4. How often during the last year have you found that you were not able to stop drinking once you had started?: Never 5. How often during the last year have you failed to do what was normally expected from you becasue of drinking?: Never 6. How often during the last year have you needed a first drink in the morning to get yourself going after a heavy drinking session?: Never 7. How often during the last year have you had a feeling of guilt of remorse after drinking?: Never 8. How often during the last year have you been unable to remember what happened the night before because you had been drinking?: Never 9. Have you or someone else been injured as a result of your drinking?: No 10. Has a relative or friend or a doctor or another health worker been concerned about your drinking or suggested you cut down?: No Alcohol Use Disorder Identification Test Final Score (AUDIT): 1 Intervention/Follow-up: AUDIT Score <7 follow-up not indicated Substance Abuse History in the last 12 months:  Yes.  Consequences of Substance Abuse: Negative Previous Psychotropic Medications: Yes  Psychological Evaluations: No  Past Medical History:  Past Medical History:  Diagnosis Date  . Anemia   . Cocaine use disorder (HCC)   . Dyspnea    with pregnancy  . Mental disorder   . Opioid use disorder, moderate, dependence (HCC)   . Substance induced  mood disorder [facility])     Past Surgical History:  Procedure Laterality Date  . CESAREAN SECTION N/A 02/09/2016   Procedure: CESAREAN SECTION;  Surgeon: Nadara Mustard, MD;  Location: [facility] ORS;  Service: Obstetrics;  Laterality: N/A;  . CESAREAN SECTION N/A 04/20/2017   Procedure: CESAREAN SECTION;  Surgeon: Vena Austria, MD;  Location: [facility] ORS;  Service: Obstetrics;  Laterality: N/A;   Family History:  Family History  Problem Relation Age of Onset  . Drug abuse Mother     Tobacco Screening: Have you used any form of tobacco in the last 30 days? (Cigarettes, Smokeless Tobacco, Cigars, and/or Pipes): Yes Tobacco use, Select all that apply: 5 or more cigarettes per day Are you interested in Tobacco Cessation Medications?: Yes, will notify MD for an order Counseled patient on smoking cessation including recognizing danger situations, developing coping skills and basic information about quitting provided: Yes Social History:  Social History   Substance and Sexual Activity  Alcohol Use No     Social History   Substance and Sexual Activity  Drug Use Yes  . Frequency: 2.0 times per week  . Types: Cocaine, Marijuana, Heroin   Comment: Been in detox    Additional Social History:                           Allergies:   Allergies  Allergen Reactions  . Naproxen Hives   Lab Results:  Results for orders placed or performed during the hospital encounter of 07/08/18 (from the past 48 hour(s))  Hemoglobin A1c     Status: None   Collection Time: 07/09/18  7:16 AM  Result Value Ref Range   Hgb A1c MFr Bld 4.8 4.8 - 5.6 %    Comment: (NOTE) Pre diabetes:          5.7%-6.4% Diabetes:              >6.4% Glycemic control for   <7.0% adults with diabetes    Mean Plasma Glucose 91.06 mg/dL    Comment: Performed at [facility], [address], [place], [place] [address]  Lipid panel     Status: Abnormal   Collection Time: 07/09/18  7:16 AM  Result Value Ref Range    Cholesterol 215 (H) 0 - 200 mg/dL   Triglycerides 604 <540 mg/dL   HDL 41 >98 mg/dL   Total CHOL/HDL Ratio 5.2 RATIO   VLDL 26 0 - 40 mg/dL   LDL Cholesterol 119 (H) 0 - 99 mg/dL    Comment:        Total Cholesterol/HDL:CHD Risk Coronary Heart Disease Risk Table                     Men   Women  1/2 Average Risk   3.4   3.3  Average Risk       5.0   4.4  2 X Average Risk   9.6   7.1  3 X Average Risk  23.4   11.0        Use the calculated Patient Ratio above  and the CHD Risk Table to determine the patient's CHD Risk.        ATP III CLASSIFICATION (LDL):  <100     mg/dL   Optimal  161-096  mg/dL   Near or Above                    Optimal  130-159  mg/dL   Borderline  045-409  mg/dL   High  >811     mg/dL   Very High Performed at [facility], [address] [place], [place] [address]   TSH     Status: None   Collection Time: 07/09/18  7:16 AM  Result Value Ref Range   TSH 1.413 0.350 - 4.500 uIU/mL    Comment: Performed by a 3rd Generation assay with a functional sensitivity of <=0.01 uIU/mL. Performed at [facility], [address] [place], [place] [address]     Blood Alcohol level:  Lab Results  Component Value Date   [facility] <10 07/08/2018   ETH <10 04/02/2018    Metabolic Disorder Labs:  Lab Results  Component Value Date   HGBA1C 4.8 07/09/2018   MPG 91.06 07/09/2018   No results found for: PROLACTIN Lab Results  Component Value Date   CHOL 215 (H) 07/09/2018   TRIG 130 07/09/2018   HDL 41 07/09/2018   CHOLHDL 5.2 07/09/2018   VLDL 26 07/09/2018   LDLCALC 148 (H) 07/09/2018    Current Medications: Current Facility-Administered Medications  Medication Dose Route Frequency Provider Last Rate Last Dose  . acetaminophen (TYLENOL) tablet 650 mg  650 mg Oral Q6H PRN Clapacs, Jackquline Denmark, MD   650 mg at 07/09/18 0830  . alum & mag hydroxide-simeth (MAALOX/MYLANTA) 200-200-20 MG/5ML suspension 30 mL  30 mL Oral Q4H PRN Clapacs, John T, MD       . chlordiazePOXIDE (LIBRIUM) capsule 5 mg  5 mg Oral QID Joron Velis B, MD      . FLUoxetine (PROZAC) capsule 20 mg  20 mg Oral Daily Kimba Lottes B, MD      . hydrOXYzine (ATARAX/VISTARIL) tablet 25 mg  25 mg Oral TID PRN Shaila Gilchrest B, MD      . magnesium hydroxide (MILK OF MAGNESIA) suspension 30 mL  30 mL Oral Daily PRN Clapacs, John T, MD      . methadone (DOLOPHINE) tablet 80 mg  80 mg Oral Daily Eugene Zeiders B, MD      . QUEtiapine (SEROQUEL) tablet 100 mg  100 mg Oral QHS Earvin Blazier B, MD      . traZODone (DESYREL) tablet 100 mg  100 mg Oral QHS PRN Clapacs, Jackquline Denmark, MD   100 mg at 07/08/18 2209   PTA Medications: Medications Prior to Admission  Medication Sig Dispense Refill Last Dose  . methadone (DOLOPHINE) 10 MG tablet Take 80 mg by mouth daily.       Musculoskeletal: Strength & Muscle Tone: within normal limits Gait & Station: normal Patient leans: N/A  Psychiatric Specialty Exam: I reviewed PE oerformed in the ER and agree with the findings. Physical Exam  Nursing note and vitals reviewed. Psychiatric: Thought content normal. Her affect is blunt. Her speech is delayed. She is withdrawn and actively hallucinating. Cognition and memory are impaired. She expresses impulsivity.    Review of Systems  Neurological: Negative.   Psychiatric/Behavioral: Positive for depression, hallucinations and substance abuse.  All other systems reviewed and are negative.   Blood pressure 104/68, pulse 68,  temperature 97.9 F (36.6 C), resp. rate 16, height 4\' 10"  (1.473 m), weight 49.9 kg, last menstrual period 06/17/2018, SpO2 99 %, unknown if currently breastfeeding.Body mass index is 22.99 kg/m.  See SRA                                                  Sleep:       Treatment Plan Summary: Daily contact with patient to assess and evaluate symptoms and progress in treatment and Medication management   Ms. Eliot FordBriggs is a  27 year old female with a history of substance abuse, mood insatibility and psychosis admitted for worsening depression and confusion.  #Mood -start Prozac 20 mg daily -Seroquel 100 mg nightly -Hydroxyzine 25 mg PRN -Trazodone 100 mg nightly  #Opioid dependence on Methadone -Methadone 80 mg daily  #Substance abuse -no alcohol but abusing Xanax and cannabis -start low dose Librium taper  #Disposition -discharge to home with her mother -follow up with [facility] and [facility]   Observation Level/Precautions:  15 minute checks  Laboratory:  CBC Chemistry Profile UDS UA  Psychotherapy:    Medications:    Consultations:    Discharge Concerns:    Estimated LOS:  Other:     Physician Treatment Plan for Primary Diagnosis: Severe recurrent major depression with psychotic features (HCC) Long Term Goal(s): Improvement in symptoms so as ready for discharge  Short Term Goals: Ability to identify changes in lifestyle to reduce recurrence of condition will improve, Ability to verbalize feelings will improve, Ability to disclose and discuss suicidal ideas, Ability to demonstrate self-control will improve, Ability to identify and develop effective coping behaviors will improve, Ability to maintain clinical measurements within normal limits will improve, Compliance with prescribed medications will improve and Ability to identify triggers associated with substance abuse/mental health issues will improve  Physician Treatment Plan for Secondary Diagnosis: Principal Problem:   Severe recurrent major depression with psychotic features (HCC) Active Problems:   Cocaine use disorder, moderate, dependence (HCC)   Opioid use disorder, mild, in sustained remission, on maintenance therapy (HCC)   Suicidal ideation   Tobacco use disorder  Long Term Goal(s): Improvement in symptoms so as ready for discharge  Short Term Goals: Ability to identify changes in lifestyle to reduce recurrence of condition  will improve, Ability to demonstrate self-control will improve and Ability to identify triggers associated with substance abuse/mental health issues will improve  I certify that inpatient services furnished can reasonably be expected to improve the patient's condition.    Kristine LineaJolanta Neytiri Asche, MD 12/28/20192:59 PM

## 2018-07-09 NOTE — Plan of Care (Signed)
Problem: Safety: Goal: Ability to remain free from injury will improve Outcome: Progressing   Problem: Coping: Goal: Coping ability will improve Outcome: Progressing   Problem: Medication: Goal: Compliance with prescribed medication regimen will improve Outcome: Progressing DAR Note: Pt presents with flat affect and depressed mood. Observed with intermittent mild confusion throughout this shift, trying to use her unit code to call her mother, left mother's number in her room and was Primary school teacherasking writer for paper just after handing her the paper for her mother. Denies SI, HI and AVH "no ma'am" verbally contracts for safety. Attended scheduled groups and participated in on and off unit activities. Support and encouragement offered to pt throughout this shift. Scheduled and PRN (Tylenol) medications given as ordered with verbal education and effects monitored. Q 15 minutes safety checks maintained without outburst or self harm gestures. Pt receptive to care. POC continues for safety and mood stability.

## 2018-07-09 NOTE — [facility] Group Notes (Signed)
LCSW Group Therapy Note  07/09/2018 1:15pm  Type of Therapy and Topic:  Group Therapy:  Cognitive Distortions  Participation Level:  Active   Description of Group:    Patients in this group will be introduced to the topic of cognitive distortions.  Patients will identify and describe cognitive distortions, describe the feelings these distortions create for them.  Patients will identify one or more situations in their personal life where they have cognitively distorted thinking and will verbalize challenging this cognitive distortion through positive thinking skills.  Patients will practice the skill of using positive affirmations to challenge cognitive distortions using affirmation cards.    Therapeutic Goals:  1. Patient will identify two or more cognitive distortions they have used 2. Patient will identify one or more emotions that stem from use of a cognitive distortion 3. Patient will demonstrate use of a positive affirmation to counter a cognitive distortion through discussion and/or role play. 4. Patient will describe one way cognitive distortions can be detrimental to wellness   Summary of Patient Progress: The patient reported that she feels "okay." Patients were introduced to the topic of cognitive distortions. The patient was able to identify and describe cognitive distortions, described the feelings these distortions create for  her.  The patient shared she uses "overgeneralizing" unhealthy thinking style. Patient identified a situation in her personal life where  she has cognitively distorted thinking and was able to verbalize and challenged this cognitive distortion through positive thinking skills. Patient was able to provide support and validation to other group members.     Therapeutic Modalities:   Cognitive Behavioral Therapy Motivational Interviewing   Cai Anfinson  CUEBAS-COLON, LCSW 07/09/2018 11:36 AM

## 2018-07-09 NOTE — Tx Team (Signed)
Initial Treatment Plan 07/09/2018 5:26 AM Tara Slipperctavia N Linsley QMV:[id number]    PATIENT STRESSORS: Financial difficulties Legal issue Substance abuse   PATIENT STRENGTHS: Average or above average intelligence Communication skills General fund of knowledge Motivation for treatment/growth   PATIENT IDENTIFIED PROBLEMS: Suicide ideation   Substance abuse  " I want to get off drugs"                    DISCHARGE CRITERIA:  Improved stabilization in mood, thinking, and/or behavior Motivation to continue treatment in a less acute level of care Reduction of life-threatening or endangering symptoms to within safe limits  PRELIMINARY DISCHARGE PLAN: Attend 12-step recovery group Outpatient therapy  PATIENT/FAMILY INVOLVEMENT: This treatment plan has been presented to and reviewed with the patient, Tara Raymond, The patient and family have been given the opportunity to ask questions and make suggestions.  Trula Orelubukola Abisola Shemaiah Round, RN 07/09/2018, 5:26 AM

## 2018-07-09 NOTE — Progress Notes (Addendum)
Admission Note:  [age] yr female who presents IVC in no acute distress for the treatment of SI,  Depression, and Substance Abuse. Patient appears flat and sad, at times tearful and restless during interview but eventually after some emotional support and reassurnce  She became calm and cooperative with admission process. Patient denies SI/HI/AVH  and contracts for safety upon admission. Patient  denies AVH,  Patient explained to writer that she experienced worsening depression and has a result decided to overdose with xanax, methadone and acetaminophen.  Patient  has Past medical Hx of  Depression, cocaine use, substance induced mood disorder, Opoid use disorder  Patient's skin was assessed and found to be clear of any abnormal marks, its warm to touch, it was also searched and no contraband found.  Plan of care and unit policies explained and understanding verbalized, consent was also obtained. Food and fluids offered, and fluids accepted.15 minutes safety checks maintained will continue to closely monitor,

## 2018-07-09 NOTE — [facility] Suicide Risk Assessment (Signed)
[facility]   Nursing information obtained from:  Patient Demographic factors:  Low socioeconomic status, Unemployed, Adolescent or young adult Current Mental Status:  NA Loss Factors:  Decrease in vocational status, Financial problems / change in socioeconomic status Historical Factors:  Prior suicide attempts, Impulsivity Risk Reduction Factors:  Positive therapeutic relationship, Positive coping skills or problem solving skills  Total Time spent with patient: 1 hour Principal Problem: Severe recurrent major depression without psychotic features (HCC) Diagnosis:  Principal Problem:   Severe recurrent major depression without psychotic features (HCC) Active Problems:   Cocaine use disorder, moderate, dependence (HCC)   Opioid use disorder, severe, dependence (HCC)   Suicidal ideation   Tobacco use disorder  Subjective Data: psychotic break  Continued Clinical Symptoms:  Alcohol Use Disorder Identification Test Final Score (AUDIT): 1 The "Alcohol Use Disorders Identification Test", Guidelines for Use in Primary Care, Second Edition.  World Science [employer] [facility](WHO). Score between 0-7:  no or low risk or alcohol related problems. Score between 8-15:  moderate risk of alcohol related problems. Score between 16-19:  high risk of alcohol related problems. Score 20 or above:  warrants further diagnostic evaluation for alcohol dependence and treatment.   CLINICAL FACTORS:   Depression:   Comorbid alcohol abuse/dependence Impulsivity Alcohol/Substance Abuse/Dependencies Currently Psychotic   Musculoskeletal: Strength & Muscle Tone: within normal limits Gait & Station: normal Patient leans: N/A  Psychiatric Specialty Exam: Physical Exam  Nursing note and vitals reviewed. Psychiatric: Her speech is normal. Thought content normal. Her affect is blunt. She is slowed, withdrawn and actively hallucinating. Cognition and memory are normal. She expresses  impulsivity. She exhibits a depressed mood.    Review of Systems  Neurological: Negative.   Psychiatric/Behavioral: Positive for depression, hallucinations and substance abuse.  All other systems reviewed and are negative.   Blood pressure 104/68, pulse 68, temperature 97.9 F (36.6 C), resp. rate 16, height 4\' 10"  (1.473 m), weight 49.9 kg, last menstrual period 06/17/2018, SpO2 99 %, unknown if currently breastfeeding.Body mass index is 22.99 kg/m.  General Appearance: Disheveled  Eye Contact:  Good  Speech:  Slow  Volume:  Decreased  Mood:  Depressed  Affect:  Flat  Thought Process:  Goal Directed and Descriptions of Associations: Intact  Orientation:  Full (Time, Place, and Person)  Thought Content:  Hallucinations: Auditory  Suicidal Thoughts:  No  Homicidal Thoughts:  No  Memory:  Immediate;   Fair Recent;   Fair Remote;   Fair  Judgement:  Poor  Insight:  Lacking  Psychomotor Activity:  Decreased  Concentration:  Concentration: Fair and Attention Span: Fair  Recall:  Poor  Fund of Knowledge:  Fair  Language:  Fair  Akathisia:  No  Handed:  Right  AIMS (if indicated):     Assets:  Communication Skills Desire for Improvement Financial Resources/Insurance Housing Physical Health Resilience Social Support  ADL's:  Intact  Cognition:  WNL  Sleep:         COGNITIVE FEATURES THAT CONTRIBUTE TO RISK:  None    SUICIDE RISK:   Minimal: No identifiable suicidal ideation.  Patients presenting with no risk factors but with morbid ruminations; may be classified as minimal risk based on the severity of the depressive symptoms  PLAN OF CARE: hospital admission, medication management, substance abuse counseling, discharge planning.  Tara Raymond is a 27 year old female with a history of substance abuse, mood insatibility and psychosis admitted for worsening depression and confusion.  #Mood -start Prozac 20 mg daily -  Seroquel 100 mg nightly -Hydroxyzine 25 mg  PRN -Trazodone 100 mg nightly  #Opioid dependence on Methadone -Methadone 80 mg daily  #Substance abuse -no alcohol but abusing Xanax and cannabis -start low dose Librium taper  #Disposition -discharge to home with her mother -[facility] with [facility]  I certify that inpatient services furnished can reasonably be expected to improve the patient's condition.   Kristine LineaJolanta Elayjah Chaney, MD 07/09/2018, 2:34 PM

## 2018-07-09 NOTE — Plan of Care (Signed)
  Problem: Education: Goal: Ability to make informed decisions regarding treatment will improve Outcome: Progressing  Patient able to make informed decision about her treatment plan.

## 2018-07-10 MED ORDER — FLUOXETINE HCL 20 MG PO CAPS: 20.0000 mg | ORAL_CAPSULE | Freq: Every day | ORAL | 3 refills | Status: DC

## 2018-07-10 MED ORDER — QUETIAPINE FUMARATE 200 MG PO TABS
300.0000 mg | ORAL_TABLET | Freq: Every day | ORAL | Status: DC
  Administered 2018-07-10: 300 mg via ORAL
  Filled 2018-07-10: qty 1

## 2018-07-10 MED ORDER — PANTOPRAZOLE SODIUM 40 MG PO TBEC
40.0000 mg | DELAYED_RELEASE_TABLET | Freq: Every day | ORAL | Status: DC
  Administered 2018-07-10 – 2018-07-11 (×2): 40 mg via ORAL
  Filled 2018-07-10 (×2): qty 1

## 2018-07-10 MED ORDER — TRAZODONE HCL 100 MG PO TABS: 100.0000 mg | ORAL_TABLET | Freq: Every evening | ORAL | 1 refills | Status: DC | PRN

## 2018-07-10 MED ORDER — QUETIAPINE FUMARATE 300 MG PO TABS: 300.0000 mg | ORAL_TABLET | Freq: Every day | ORAL | 1 refills | Status: DC

## 2018-07-10 MED ORDER — ENSURE ENLIVE PO LIQD
237.0000 mL | Freq: Three times a day (TID) | ORAL | Status: DC
  Administered 2018-07-10 – 2018-07-11 (×2): 237 mL via ORAL

## 2018-07-10 NOTE — Plan of Care (Signed)
Patient compliant with medication administration per MD orders and procedures on the unit. Patient stated she had a good day and said she was going to use reading as a coping mechanism.   Problem: Coping: Goal: Coping ability will improve Outcome: Progressing   Problem: Medication: Goal: Compliance with prescribed medication regimen will improve Outcome: Progressing

## 2018-07-10 NOTE — Progress Notes (Addendum)
[facility] MD Progress Note  07/10/2018 3:12 PM Tara Raymond  MRN:  [id number]  Subjective:    Tara Raymond feels better. She was restarted on Methadone. There are slight symptoms of benzo withdrawal. She os on minimal Librium taper. Sleep was interrupted. Otherwise, she tolerates medications well. No more sad thoughts or hallucinations. Wants to go home tomorrow.   Met with the mother who is supportive and appreciated progress the patient has made.  Principal Problem: Severe recurrent major depression with psychotic features ([place]) Diagnosis: Principal Problem:   Severe recurrent major depression with psychotic features ([place]) Active Problems:   Cocaine use disorder, moderate, dependence (HCC)   Opioid use disorder, mild, in sustained remission, on maintenance therapy (HCC)   Suicidal ideation   Tobacco use disorder  Total Time spent with patient: 20 minutes  Past Psychiatric History: psychotic depression  Past Medical History:  Past Medical History:  Diagnosis Date  . Anemia   . Cocaine use disorder ([place])   . Dyspnea    with pregnancy  . Mental disorder   . Opioid use disorder, moderate, dependence ([place])   . Substance induced mood disorder [facility])     Past Surgical History:  Procedure Laterality Date  . CESAREAN SECTION N/A 02/09/2016   Procedure: CESAREAN SECTION;  Surgeon: Gae Dry, MD;  Location: [facility] ORS;  Service: Obstetrics;  Laterality: N/A;  . CESAREAN SECTION N/A 04/20/2017   Procedure: CESAREAN SECTION;  Surgeon: Malachy Mood, MD;  Location: [facility] ORS;  Service: Obstetrics;  Laterality: N/A;   Family History:  Family History  Problem Relation Age of Onset  . Drug abuse Mother    Family Psychiatric  History: cousind with mental illness Social History:  Social History   Substance and Sexual Activity  Alcohol Use No     Social History   Substance and Sexual Activity  Drug Use Yes  . Frequency: 2.0 times per week  . Types: Cocaine, Marijuana,  Heroin   Comment: Been in detox    Social History   Socioeconomic History  . Marital status: Single    Spouse name: Not on file  . Number of children: Not on file  . Years of education: Not on file  . Highest education level: Not on file  Occupational History  . Not on file  Social Needs  . Financial resource strain: Not on file  . Food insecurity:    Worry: Not on file    Inability: Not on file  . Transportation needs:    Medical: Not on file    Non-medical: Not on file  Tobacco Use  . Smoking status: Current Every Day Smoker    Packs/day: 0.50    Types: Cigarettes  . Smokeless tobacco: Current User  Substance and Sexual Activity  . Alcohol use: No  . Drug use: Yes    Frequency: 2.0 times per week    Types: Cocaine, Marijuana, Heroin    Comment: Been in detox  . Sexual activity: Yes  Lifestyle  . Physical activity:    Days per week: Not on file    Minutes per session: Not on file  . Stress: Not on file  Relationships  . Social connections:    Talks on phone: Not on file    Gets together: Not on file    Attends religious service: Not on file    Active member of club or organization: Not on file    Attends meetings of clubs or organizations: Not on file  Relationship status: Not on file  Other Topics Concern  . Not on file  Social History Narrative  . Not on file   Additional Social History:                         Sleep: Fair  Appetite:  Fair  Current Medications: Current Facility-Administered Medications  Medication Dose Route Frequency Provider Last Rate Last Dose  . acetaminophen (TYLENOL) tablet 650 mg  650 mg Oral Q6H PRN Clapacs, Madie Reno, MD   650 mg at 07/09/18 0830  . alum & mag hydroxide-simeth (MAALOX/MYLANTA) 200-200-20 MG/5ML suspension 30 mL  30 mL Oral Q4H PRN Clapacs, John T, MD      . chlordiazePOXIDE (LIBRIUM) capsule 5 mg  5 mg Oral QID Pucilowska, Jolanta B, MD   5 mg at 07/10/18 1236  . feeding supplement (ENSURE ENLIVE)  (ENSURE ENLIVE) liquid 237 mL  237 mL Oral TID BM Pucilowska, Jolanta B, MD      . FLUoxetine (PROZAC) capsule 20 mg  20 mg Oral Daily Pucilowska, Jolanta B, MD   20 mg at 07/10/18 0809  . hydrOXYzine (ATARAX/VISTARIL) tablet 25 mg  25 mg Oral TID PRN Pucilowska, Jolanta B, MD      . magnesium hydroxide (MILK OF MAGNESIA) suspension 30 mL  30 mL Oral Daily PRN Clapacs, Madie Reno, MD   30 mL at 07/10/18 0808  . methadone (DOLOPHINE) tablet 80 mg  80 mg Oral Daily Pucilowska, Jolanta B, MD   80 mg at 07/10/18 0809  . pantoprazole (PROTONIX) EC tablet 40 mg  40 mg Oral Daily Pucilowska, Jolanta B, MD   40 mg at 07/10/18 1447  . QUEtiapine (SEROQUEL) tablet 300 mg  300 mg Oral QHS Pucilowska, Jolanta B, MD      . traZODone (DESYREL) tablet 100 mg  100 mg Oral QHS PRN Clapacs, Madie Reno, MD   100 mg at 07/08/18 2209    Lab Results:  Results for orders placed or performed during the hospital encounter of 07/08/18 (from the past 48 hour(s))  Hemoglobin A1c     Status: None   Collection Time: 07/09/18  7:16 AM  Result Value Ref Range   Hgb A1c MFr Bld 4.8 4.8 - 5.6 %    Comment: (NOTE) Pre diabetes:          5.7%-6.4% Diabetes:              >6.4% Glycemic control for   <7.0% adults with diabetes    Mean Plasma Glucose 91.06 mg/dL    Comment: Performed at [facility], [place] [address], [place], [place] [address]  Lipid panel     Status: Abnormal   Collection Time: 07/09/18  7:16 AM  Result Value Ref Range   Cholesterol 215 (H) 0 - 200 mg/dL   Triglycerides 130 <150 mg/dL   HDL 41 >40 mg/dL   Total CHOL/HDL Ratio 5.2 RATIO   VLDL 26 0 - 40 mg/dL   LDL Cholesterol 148 (H) 0 - 99 mg/dL    Comment:        Total Cholesterol/HDL:CHD Risk Coronary Heart Disease Risk Table                     Men   Women  1/2 Average Risk   3.4   3.3  Average Risk       5.0   4.4  2 X Average Risk   9.6  7.1  3 X Average Risk  23.4   11.0        Use the calculated Patient Ratio above and the CHD Risk  Table to determine the patient's CHD Risk.        ATP III CLASSIFICATION (LDL):  <100     mg/dL   Optimal  100-129  mg/dL   Near or Above                    Optimal  130-159  mg/dL   Borderline  160-189  mg/dL   High  >190     mg/dL   Very High Performed at [facility], [address] [place], [place] [address]   TSH     Status: None   Collection Time: 07/09/18  7:16 AM  Result Value Ref Range   TSH 1.413 0.350 - 4.500 uIU/mL    Comment: Performed by a 3rd Generation assay with a functional sensitivity of <=0.01 uIU/mL. Performed at [facility], [address] [place], [place] [address]     Blood Alcohol level:  Lab Results  Component Value Date   [facility] <10 07/08/2018   ETH <10 09/62/8366    Metabolic Disorder Labs: Lab Results  Component Value Date   HGBA1C 4.8 07/09/2018   MPG 91.06 07/09/2018   No results found for: PROLACTIN Lab Results  Component Value Date   CHOL 215 (H) 07/09/2018   TRIG 130 07/09/2018   HDL 41 07/09/2018   CHOLHDL 5.2 07/09/2018   VLDL 26 07/09/2018   LDLCALC 148 (H) 07/09/2018    Physical Findings: AIMS: Facial and Oral Movements Muscles of Facial Expression: None, normal Lips and Perioral Area: None, normal Jaw: None, normal Tongue: None, normal,Extremity Movements Upper (arms, wrists, hands, fingers): None, normal Lower (legs, knees, ankles, toes): None, normal, Trunk Movements Neck, shoulders, hips: None, normal, Overall Severity Severity of abnormal movements (highest score from questions above): None, normal Incapacitation due to abnormal movements: None, normal Patient's awareness of abnormal movements (rate only patient's report): No Awareness, Dental Status Current problems with teeth and/or dentures?: No Does patient usually wear dentures?: No  CIWA:  CIWA-Ar Total: 4 COWS:  COWS Total Score: 0  Musculoskeletal: Strength & Muscle Tone: within normal limits Gait & Station: normal Patient leans:  N/A  Psychiatric Specialty Exam: Physical Exam  Nursing note and vitals reviewed. Psychiatric: Her speech is normal. Thought content normal. Her affect is blunt. She is withdrawn. Cognition and memory are normal. She expresses impulsivity.    Review of Systems  Neurological: Negative.   Psychiatric/Behavioral: Negative.   All other systems reviewed and are negative.   Blood pressure 120/73, pulse 74, temperature 98.4 F (36.9 C), temperature source Oral, resp. rate 18, height 4' 10"  (1.473 m), weight 49.9 kg, last menstrual period 06/17/2018, SpO2 99 %, unknown if currently breastfeeding.Body mass index is 22.99 kg/m.  General Appearance: Casual  Eye Contact:  Good  Speech:  Clear and Coherent  Volume:  Normal  Mood:  Depressed  Affect:  Blunt  Thought Process:  Goal Directed and Descriptions of Associations: Intact  Orientation:  Full (Time, Place, and Person)  Thought Content:  WDL  Suicidal Thoughts:  No  Homicidal Thoughts:  No  Memory:  Immediate;   Fair Recent;   Fair Remote;   Fair  Judgement:  Impaired  Insight:  Shallow  Psychomotor Activity:  Decreased  Concentration:  Concentration: Fair and Attention Span: Fair  Recall:  Fair  Fund of Knowledge:  Fair  Language:  Fair  Akathisia:  No  Handed:  Right  AIMS (if indicated):     Assets:  Communication Skills Desire for Improvement Financial Resources/Insurance Housing Physical Health Resilience Social Support  ADL's:  Intact  Cognition:  WNL  Sleep:  Number of Hours: 5.15     Treatment Plan Summary: Daily contact with patient to assess and evaluate symptoms and progress in treatment and Medication management   Tara Raymond is a 27 year old female with a history of substance abuse, mood insatibility and psychosis admitted for worsening depression and confusion.  #Mood, improved, psychosis resolved -Prozac 20 mg daily -increase Seroquel to 300 mg nightly -Hydroxyzine 25 mg PRN -Trazodone 100 mg  nightly  #Opioid dependence on Methadone -Methadone 80 mg daily  #Substance abuse -no alcohol but abusing Xanax and cannabis -continue low dose Librium taper  #Por appetite -Ensure TID -Protonix 40 mg daily  #Smoking cessation -nicotine patch was availale  #Labs -lipid panel shows elevated Chol, TSH and A1C are normal -EKG reviewed, sinus rhythm with QTc 487 -pregnancy test is negative  #Disposition -discharge to home with her mother -[facility] with [facility]  Orson Slick, MD 07/10/2018, 3:12 PM

## 2018-07-10 NOTE — Discharge Summary (Signed)
Physician Discharge Summary Note  Patient:  Tara Raymond is an 27 y.o., female MRN:  [id number] DOB:  1991-07-05 Patient phone:  [phone] (home)  Patient address:   [address] [place] [id number],  Total Time spent with patient: 20 minutes plus 15 min for care cordiation and documentation  Date of Admission:  07/08/2018 Date of Discharge: 07/12/2019  Reason for Admission:  Suicidal ideation.  History of Present Illness:   Identifying data. Tara Raymond is a 27 year old female with a history of psychotic depression.  Chief complaint. "I don't know what has happened."  History of present illness. Information was obtained from the patient and the chart. The patient was brought to the ER for disorganized behavior with hallucinations. The patient does not remember exactly but believes that her symptoms started around Thanksgiving and have gotten worse when she was stressed ou over Christmas holidays. She reports depressed mood, sadness, anhedonia, feeling of worthlessness, crying spells, low energy and concentration. Denies suicidal thinking but her mother reports suicidal thoughts. She report some paranoia, nightmares and flashbacks of PTSD. No panic attacks or OCD. She has a history of opioid abuse and has been on methadone maintenance therapy with success. She was positive for cannabis and benzodiazepine. She buya Xanax off the street.  Past psychiatric history. She was tried on antidepressants and Seroquel in thepast.  Family psychiatric history. Cousins with metal illness.  Social history. Lives with her mother, has Medicaid but no income. Unemployed.   Principal Problem: Severe recurrent major depression with psychotic features [facility](HCC) Discharge Diagnoses: Principal Problem:   Severe recurrent major depression with psychotic features (HCC) Active Problems:   Cocaine use disorder, moderate, dependence (HCC)   Opioid use disorder, mild, in sustained  remission, on maintenance therapy (HCC)   Suicidal ideation   Tobacco use disorder  Past Medical History:  Past Medical History:  Diagnosis Date  . Anemia   . Cocaine use disorder (HCC)   . Dyspnea    with pregnancy  . Mental disorder   . Opioid use disorder, moderate, dependence (HCC)   . Substance induced mood disorder [facility](HCC)     Past Surgical History:  Procedure Laterality Date  . CESAREAN SECTION N/A 02/09/2016   Procedure: CESAREAN SECTION;  Surgeon: Nadara Mustardobert P Harris, MD;  Location: [facility] ORS;  Service: Obstetrics;  Laterality: N/A;  . CESAREAN SECTION N/A 04/20/2017   Procedure: CESAREAN SECTION;  Surgeon: Vena AustriaStaebler, Andreas, MD;  Location: [facility] ORS;  Service: Obstetrics;  Laterality: N/A;   Family History:  Family History  Problem Relation Age of Onset  . Drug abuse Mother     Social History:  Social History   Substance and Sexual Activity  Alcohol Use No     Social History   Substance and Sexual Activity  Drug Use Yes  . Frequency: 2.0 times per week  . Types: Cocaine, Marijuana, Heroin   Comment: Been in detox    Social History   Socioeconomic History  . Marital status: Single    Spouse name: Not on file  . Number of children: Not on file  . Years of education: Not on file  . Highest education level: Not on file  Occupational History  . Not on file  Social Needs  . Financial resource strain: Not on file  . Food insecurity:    Worry: Not on file    Inability: Not on file  . Transportation needs:    Medical: Not on file    Non-medical: Not on  file  Tobacco Use  . Smoking status: Current Every Day Smoker    Packs/day: 0.50    Types: Cigarettes  . Smokeless tobacco: Current User  Substance and Sexual Activity  . Alcohol use: No  . Drug use: Yes    Frequency: 2.0 times per week    Types: Cocaine, Marijuana, Heroin    Comment: Been in detox  . Sexual activity: Yes  Lifestyle  . Physical activity:    Days per week: Not on file    Minutes per  session: Not on file  . Stress: Not on file  Relationships  . Social connections:    Talks on phone: Not on file    Gets together: Not on file    Attends religious service: Not on file    Active member of club or organization: Not on file    Attends meetings of clubs or organizations: Not on file    Relationship status: Not on file  Other Topics Concern  . Not on file  Social History Narrative  . Not on file    Hospital Course:    Tara Raymond is a [age] year old female with a history of substance abuse, mood insatibility and psychosis admitted for worsening depression and confusion. She accepted medications and tolerated them well. At the time of discharge, the patient is no longer suicidal or psychotic. She is able to contract for safety. She is forward thinking and optimistic about the future.  #Mood, improved, psychosis resolved -Prozac 20 mg daily -Seroquel to 300 mg nightly -Trazodone 100 mg nightly PRN  #Opioid dependence on Methadone -Methadone 80 mg daily, no Rx given  #Substance abuse -positive for benzos and cannabis, no alcohol  -completed Librium taper, VS are stable  #Por apetite -Ensure TID  #Smoking cessation -nicotine patch was availale  #Labs -lipid panel shows elevated Chol, TSH and A1C are normal -EKG reviewed, sinus rhythm with QTc 487 -pregnancy test is negative  #Disposition -discharge to home with her mother -follow up with [facility] and [facility]  Physical Findings: AIMS: Facial and Oral Movements Muscles of Facial Expression: None, normal Lips and Perioral Area: None, normal Jaw: None, normal Tongue: None, normal,Extremity Movements Upper (arms, wrists, hands, fingers): None, normal Lower (legs, knees, ankles, toes): None, normal, Trunk Movements Neck, shoulders, hips: None, normal, Overall Severity Severity of abnormal movements (highest score from questions above): None, normal Incapacitation due to abnormal movements: None,  normal Patient's awareness of abnormal movements (rate only patient's report): No Awareness, Dental Status Current problems with teeth and/or dentures?: No Does patient usually wear dentures?: No  CIWA:  CIWA-Ar Total: 4 COWS:  COWS Total Score: 0  Musculoskeletal: Strength & Muscle Tone: within normal limits Gait & Station: normal Patient leans: N/A  Psychiatric Specialty Exam: Physical Exam  Nursing note and vitals reviewed. Psychiatric: She has a normal mood and affect. Her speech is normal and behavior is normal. Thought content normal. Cognition and memory are normal. She expresses impulsivity.    Review of Systems  Neurological: Negative.   Psychiatric/Behavioral: Positive for substance abuse.  All other systems reviewed and are negative.   Blood pressure 100/72, pulse 74, temperature 98.3 F (36.8 C), temperature source Oral, resp. rate 18, height 4\' 10"  (1.473 m), weight 49.9 kg, last menstrual period 06/17/2018, SpO2 98 %, unknown if currently breastfeeding.Body mass index is 22.99 kg/m.  General Appearance: Casual  Eye Contact:  Good  Speech:  Clear and Coherent  Volume:  Normal  Mood:  Euthymic  Affect:  Appropriate  Thought Process:  Goal Directed and Descriptions of Associations: Intact  Orientation:  Full (Time, Place, and Person)  Thought Content:  WDL  Suicidal Thoughts:  No  Homicidal Thoughts:  No  Memory:  Immediate;   Fair Recent;   Fair Remote;   Fair  Judgement:  Impaired  Insight:  Lacking  Psychomotor Activity:  Normal  Concentration:  Concentration: Fair and Attention Span: Fair  Recall:  [employer] of Knowledge:  Fair  Language:  Fair  Akathisia:  No  Handed:  Right  AIMS (if indicated):     Assets:  Communication Skills Desire for Improvement Housing Physical Health Resilience Social Support  ADL's:  Intact  Cognition:  WNL  Sleep:  Number of Hours: 6     Have you used any form of tobacco in the last 30 days? (Cigarettes,  Smokeless Tobacco, Cigars, and/or Pipes): Yes  Has this patient used any form of tobacco in the last 30 days? (Cigarettes, Smokeless Tobacco, Cigars, and/or Pipes) Yes, Yes, A prescription for an FDA-approved tobacco cessation medication was offered at discharge and the patient refused  Blood Alcohol level:  Lab Results  Component Value Date   ETH <10 07/08/2018   ETH <10 04/02/2018    Metabolic Disorder Labs:  Lab Results  Component Value Date   HGBA1C 4.8 07/09/2018   MPG 91.06 07/09/2018   No results found for: PROLACTIN Lab Results  Component Value Date   CHOL 215 (H) 07/09/2018   TRIG 130 07/09/2018   HDL 41 07/09/2018   CHOLHDL 5.2 07/09/2018   VLDL 26 07/09/2018   LDLCALC 148 (H) 07/09/2018    See Psychiatric Specialty Exam and Suicide Risk Assessment completed by Attending Physician prior to discharge.  Discharge destination:  Home  Is patient on multiple antipsychotic therapies at discharge:  No   Has Patient had three or more failed trials of antipsychotic monotherapy by history:  No  Recommended Plan for Multiple Antipsychotic Therapies: NA  Discharge Instructions    Diet - low sodium heart healthy   Complete by:  As directed    Increase activity slowly   Complete by:  As directed      Allergies as of 07/11/2018      Reactions   Naproxen Hives      Medication List    TAKE these medications     Indication  FLUoxetine 20 MG capsule Commonly known as:  PROZAC Take 1 capsule (20 mg total) by mouth daily.  Indication:  Depression   methadone 10 MG tablet Commonly known as:  DOLOPHINE Take 80 mg by mouth daily.  Indication:  Opioid Dependence   QUEtiapine 300 MG tablet Commonly known as:  SEROQUEL Take 1 tablet (300 mg total) by mouth at bedtime.  Indication:  Major Depressive Disorder   traZODone 100 MG tablet Commonly known as:  DESYREL Take 1 tablet (100 mg total) by mouth at bedtime as needed for sleep.  Indication:  Trouble Sleeping         Follow-up recommendations:  Activity:  as tolerated Diet:  low sodium heart healthy Other:  keep follow up appointments  Comments:    Signed: Kristine Linea, MD 07/11/2018, 9:21 AM

## 2018-07-10 NOTE — Plan of Care (Signed)
Patient verbalizes understanding of her condition as well as her discharge needs and has not voiced any further questions or concerns at this time. Patient has the ability to identify the changes that need to be made in her lifestyle to reduce recurrence of her disease. Patient also has the ability to identify the resources available to help her meet her health-care needs. Patient has been free from any complications thus far. Patient has been free from injury thus far. Patient has been in compliance with her prescribed therapeutic regimen and all questions/concerns have been addressed and answered at this time. Patient denies SI/HI/AVH. Patient rated her depression and anxiety a "10/10" stating "me being in here" is why she's feeling this way. Patient remains safe on the unit at this time.   Problem: Education: Goal: Knowledge of disease or condition will improve Outcome: Progressing Goal: Understanding of discharge needs will improve Outcome: Progressing   Problem: Health Behavior/Discharge Planning: Goal: Ability to identify changes in lifestyle to reduce recurrence of condition will improve Outcome: Progressing Goal: Identification of resources available to assist in meeting health care needs will improve Outcome: Progressing   Problem: Physical Regulation: Goal: Complications related to the disease process, condition or treatment will be avoided or minimized Outcome: Progressing   Problem: Safety: Goal: Ability to remain free from injury will improve Outcome: Progressing   Problem: Education: Goal: Ability to make informed decisions regarding treatment will improve Outcome: Progressing   Problem: Coping: Goal: Coping ability will improve Outcome: Progressing   Problem: Health Behavior/Discharge Planning: Goal: Identification of resources available to assist in meeting health care needs will improve Outcome: Progressing   Problem: Medication: Goal: Compliance with prescribed  medication regimen will improve Outcome: Progressing   Problem: Self-Concept: Goal: Ability to disclose and discuss suicidal ideas will improve Outcome: Progressing Goal: Will verbalize positive feelings about self Outcome: Progressing

## 2018-07-10 NOTE — Progress Notes (Signed)
D- Patient alert and oriented. Patient presents in a pleasant mood on assessment stating that she slept "alright" last night and her only complaint was of back pain, rating her pain level a "10/10". Patient also rated her depression/anxiety a "10/10" stating that "me being in here" is why she is feeling this way. Patient denies SI, HI, AVH, at this time. Patient has no stated goals for today.  A- Scheduled medications administered to patient, per MD orders. Support and encouragement provided.  Routine safety checks conducted every 15 minutes.  Patient informed to notify staff with problems or concerns.  R- No adverse drug reactions noted. Patient contracts for safety at this time. Patient compliant with medications and treatment plan. Patient receptive, calm, and cooperative. Patient interacts well with others on the unit.  Patient remains safe at this time.

## 2018-07-10 NOTE — [facility] Suicide Risk Assessment (Signed)
[facility]   Principal Problem: Severe recurrent major depression with psychotic features [facility](HCC) Discharge Diagnoses: Principal Problem:   Severe recurrent major depression with psychotic features (HCC) Active Problems:   Cocaine use disorder, moderate, dependence (HCC)   Opioid use disorder, mild, in sustained remission, on maintenance therapy (HCC)   Suicidal ideation   Tobacco use disorder   Total Time spent with patient: 20 minutes  Musculoskeletal: Strength & Muscle Tone: within normal limits Gait & Station: normal Patient leans: N/A  Psychiatric Specialty Exam: Review of Systems  Neurological: Negative.   Psychiatric/Behavioral: Positive for substance abuse.  All other systems reviewed and are negative.   Blood pressure 100/72, pulse 74, temperature 98.3 F (36.8 C), temperature source Oral, resp. rate 18, height 4\' 10"  (1.473 m), weight 49.9 kg, last menstrual period 06/17/2018, SpO2 98 %, unknown if currently breastfeeding.Body mass index is 22.99 kg/m.  General Appearance: Casual  Eye Contact::  Good  Speech:  Clear and Coherent409  Volume:  Normal  Mood:  Euthymic  Affect:  Appropriate  Thought Process:  Goal Directed and Descriptions of Associations: Intact  Orientation:  Full (Time, Place, and Person)  Thought Content:  WDL  Suicidal Thoughts:  No  Homicidal Thoughts:  No  Memory:  Immediate;   Fair Recent;   Fair Remote;   Fair  Judgement:  Poor  Insight:  Lacking  Psychomotor Activity:  Normal  Concentration:  Fair  Recall:  [employer]  Fund of Knowledge:Fair  Language: Fair  Akathisia:  No  Handed:  Right  AIMS (if indicated):     Assets:  Communication Skills Desire for Improvement Housing Physical Health Resilience Social Support  Sleep:  Number of Hours: 6  Cognition: WNL  ADL's:  Intact   Mental Status Per Nursing Assessment::   On Admission:  NA  Demographic Factors:  Adolescent or young adult and  Unemployed  Loss Factors: NA  Historical Factors: Impulsivity  Risk Reduction Factors:   Sense of responsibility to family, Living with another person, especially a relative and Positive social support  Continued Clinical Symptoms:  Depression:   Comorbid alcohol abuse/dependence Impulsivity Alcohol/Substance Abuse/Dependencies  Cognitive Features That Contribute To Risk:  None    Suicide Risk:  Minimal: No identifiable suicidal ideation.  Patients presenting with no risk factors but with morbid ruminations; may be classified as minimal risk based on the severity of the depressive symptoms    Plan Of Care/Follow-up recommendations:  Activity:  as tolerated Diet:  regular Other:  keep follow up appointments  Kristine LineaJolanta Keshayla Schrum, MD 07/11/2018, 9:21 AM
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
## 2018-07-10 NOTE — Progress Notes (Signed)
Patient states thet she is generally doing well  With her medicines, depression symptoms have improved but she still feels down at times, she is sleeping better ans restful , denies any SI/HI/AVH contract for safety, encouraged patient to engage in activities to help with her  Coping mechanisms, patient acknowledged, 15 minute safety rounding continued no distress noted.

## 2018-07-10 NOTE — Plan of Care (Signed)
Patient is doing well in the unit, sleep is  long and restful without interruptions, appetite has improved , no suicide ideations , mood is fair and affect is improving .   Problem: Education: Goal: Knowledge of disease or condition will improve Outcome: Progressing Goal: Understanding of discharge needs will improve Outcome: Progressing   Problem: Health Behavior/Discharge Planning: Goal: Ability to identify changes in lifestyle to reduce recurrence of condition will improve Outcome: Progressing Goal: Identification of resources available to assist in meeting health care needs will improve Outcome: Progressing   Problem: Physical Regulation: Goal: Complications related to the disease process, condition or treatment will be avoided or minimized Outcome: Progressing   Problem: Safety: Goal: Ability to remain free from injury will improve Outcome: Progressing   Problem: Education: Goal: Ability to make informed decisions regarding treatment will improve Outcome: Progressing   Problem: Coping: Goal: Coping ability will improve Outcome: Progressing   Problem: Health Behavior/Discharge Planning: Goal: Identification of resources available to assist in meeting health care needs will improve Outcome: Progressing   Problem: Medication: Goal: Compliance with prescribed medication regimen will improve Outcome: Progressing   Problem: Self-Concept: Goal: Ability to disclose and discuss suicidal ideas will improve Outcome: Progressing Goal: Will verbalize positive feelings about self Outcome: Progressing

## 2018-07-10 NOTE — [facility] Group Notes (Signed)
LCSW Group Therapy Note 07/10/2018 1:15pm  Type of Therapy and Topic: Group Therapy: Feelings Around Returning Home & Establishing a Supportive Framework and Supporting Oneself When Supports Not Available  Participation Level: Active  Description of Group:  Patients first processed thoughts and feelings about upcoming discharge. These included fears of upcoming changes, lack of change, new living environments, judgements and expectations from others and overall stigma of mental health issues. The group then discussed the definition of a supportive framework, what that looks and feels like, and how do to discern it from an unhealthy non-supportive network. The group identified different types of supports as well as what to do when your family/friends are less than helpful or unavailable  Therapeutic Goals  1. Patient will identify one healthy supportive network that they can use at discharge. 2. Patient will identify one factor of a supportive framework and how to tell it from an unhealthy network. 3. Patient able to identify one coping skill to use when they do not have positive supports from others. 4. Patient will demonstrate ability to communicate their needs through discussion and/or role plays.  Summary of Patient Progress:  The patient reports she feels "alright." Pt engaged during group session. As patients processed their anxiety about discharge and described healthy supports patient shared she is ready to be discharge.  Patients identified at least one self-care tool they were willing to use after discharge.   Therapeutic Modalities Cognitive Behavioral Therapy Motivational Interviewing   Kendryck Lacroix  CUEBAS-COLON, LCSW 07/10/2018 8:47 AM

## 2018-07-10 NOTE — Progress Notes (Signed)
D - Patient was in the day room upon arrival to the unit. Patient was pleasant during assessment and medication administration. Patient denies SI/HI/AVH and stated that her anxiety was 7/10 and her depression was 8/10. Patient stated that she had a good day and was reading as a coping mechanism.   A - Patient compliant with medication administration per MD orders and procedures on the unit. Patient given education. Patient given support and encouragement. Patient informed to let staff know if there are any issues or problems on the unit.   R - Patient being monitored Q 15 minutes for safety per unit protocol. Verbally contracts for safety with this Clinical research associatewriter. Patient remains safe on the unit at this time.

## 2018-07-11 NOTE — Progress Notes (Signed)
  [facility] :  Will you be returning to the same living situation after discharge:  Yes,  with mother At discharge, do you have transportation home?: Yes,  mother Do you have the ability to pay for your medications: No. Medication Management app completed and faxed.  Release of information consent forms completed and in the chart;  Patient's signature needed at discharge.  Patient to Follow up at: Follow-up Information    [facility] Go on 07/15/2018.   Why:  Please meet Unk PintoHarvey Bryant, Peer Support Services, on Friday, 07/15/17, at 7:15am for your intake appt.  Please bring photo ID and your hospital discharge paperwork.  Contact information: [address] [place] [address] [phone]        [facility]. Go on 07/12/2018.   Why:  Please attend your regular methadone dosing appt each day between 5am-10am. Contact information: [address] [place], [place] [address] P: [phone] F: [phone]          Next level of care provider has access to [facility] Link:no  Safety Planning and Suicide Prevention discussed: Yes,  with mother  Have you used any form of tobacco in the last 30 days? (Cigarettes, Smokeless Tobacco, Cigars, and/or Pipes): Yes  Has patient been referred to the Quitline?: Patient refused referral  Patient has been referred for addiction treatment: Yes  Lorri FrederickWierda, Xavyer Steenson Jon, LCSW 07/11/2018, 10:32 AM

## 2018-07-11 NOTE — Tx Team (Signed)
Interdisciplinary Treatment and Diagnostic Plan Update  07/11/2018 Time of Session: 10:50AM Judeth Gilles MRN: [id number]  Principal Diagnosis: Severe recurrent major depression with psychotic features [facility])  Secondary Diagnoses: Principal Problem:   Severe recurrent major depression with psychotic features (HCC) Active Problems:   Cocaine use disorder, moderate, dependence (HCC)   Opioid use disorder, mild, in sustained remission, on maintenance therapy (HCC)   Suicidal ideation   Tobacco use disorder   Current Medications:  Current Facility-Administered Medications  Medication Dose Route Frequency Provider Last Rate Last Dose  . acetaminophen (TYLENOL) tablet 650 mg  650 mg Oral Q6H PRN Clapacs, Jackquline Denmark, MD   650 mg at 07/09/18 0830  . alum & mag hydroxide-simeth (MAALOX/MYLANTA) 200-200-20 MG/5ML suspension 30 mL  30 mL Oral Q4H PRN Clapacs, John T, MD      . chlordiazePOXIDE (LIBRIUM) capsule 5 mg  5 mg Oral QID Pucilowska, Jolanta B, MD   5 mg at 07/11/18 0831  . feeding supplement (ENSURE ENLIVE) (ENSURE ENLIVE) liquid 237 mL  237 mL Oral TID BM Pucilowska, Jolanta B, MD   237 mL at 07/10/18 2114  . FLUoxetine (PROZAC) capsule 20 mg  20 mg Oral Daily Pucilowska, Jolanta B, MD   20 mg at 07/11/18 0831  . hydrOXYzine (ATARAX/VISTARIL) tablet 25 mg  25 mg Oral TID PRN Pucilowska, Jolanta B, MD      . magnesium hydroxide (MILK OF MAGNESIA) suspension 30 mL  30 mL Oral Daily PRN Clapacs, Jackquline Denmark, MD   30 mL at 07/10/18 0808  . methadone (DOLOPHINE) tablet 80 mg  80 mg Oral Daily Pucilowska, Jolanta B, MD   80 mg at 07/11/18 0831  . pantoprazole (PROTONIX) EC tablet 40 mg  40 mg Oral Daily Pucilowska, Jolanta B, MD   40 mg at 07/11/18 0832  . QUEtiapine (SEROQUEL) tablet 300 mg  300 mg Oral QHS Pucilowska, Jolanta B, MD   300 mg at 07/10/18 2112  . traZODone (DESYREL) tablet 100 mg  100 mg Oral QHS PRN Clapacs, Jackquline Denmark, MD   100 mg at 07/10/18 2112   PTA Medications: Medications  Prior to Admission  Medication Sig Dispense Refill Last Dose  . methadone (DOLOPHINE) 10 MG tablet Take 80 mg by mouth daily.       Patient Stressors: Paediatric nurse issue Substance abuse  Patient Strengths: Average or above average intelligence Communication skills General fund of knowledge Motivation for treatment/growth  Treatment Modalities: Medication Management, Group therapy, Case management,  1 to 1 session with clinician, Psychoeducation, Recreational therapy.   Physician Treatment Plan for Primary Diagnosis: Severe recurrent major depression with psychotic features (HCC) Long Term Goal(s): Improvement in symptoms so as ready for discharge Improvement in symptoms so as ready for discharge   Short Term Goals: Ability to identify changes in lifestyle to reduce recurrence of condition will improve Ability to verbalize feelings will improve Ability to disclose and discuss suicidal ideas Ability to demonstrate self-control will improve Ability to identify and develop effective coping behaviors will improve Ability to maintain clinical measurements within normal limits will improve Compliance with prescribed medications will improve Ability to identify triggers associated with substance abuse/mental health issues will improve Ability to identify changes in lifestyle to reduce recurrence of condition will improve Ability to demonstrate self-control will improve Ability to identify triggers associated with substance abuse/mental health issues will improve  Medication Management: Evaluate patient's response, side effects, and tolerance of medication regimen.  Therapeutic Interventions: 1 to 1 sessions, Unit  Group sessions and Medication administration.  Evaluation of Outcomes: Adequate for Discharge  Physician Treatment Plan for Secondary Diagnosis: Principal Problem:   Severe recurrent major depression with psychotic features (HCC) Active Problems:   Cocaine  use disorder, moderate, dependence (HCC)   Opioid use disorder, mild, in sustained remission, on maintenance therapy (HCC)   Suicidal ideation   Tobacco use disorder  Long Term Goal(s): Improvement in symptoms so as ready for discharge Improvement in symptoms so as ready for discharge   Short Term Goals: Ability to identify changes in lifestyle to reduce recurrence of condition will improve Ability to verbalize feelings will improve Ability to disclose and discuss suicidal ideas Ability to demonstrate self-control will improve Ability to identify and develop effective coping behaviors will improve Ability to maintain clinical measurements within normal limits will improve Compliance with prescribed medications will improve Ability to identify triggers associated with substance abuse/mental health issues will improve Ability to identify changes in lifestyle to reduce recurrence of condition will improve Ability to demonstrate self-control will improve Ability to identify triggers associated with substance abuse/mental health issues will improve     Medication Management: Evaluate patient's response, side effects, and tolerance of medication regimen.  Therapeutic Interventions: 1 to 1 sessions, Unit Group sessions and Medication administration.  Evaluation of Outcomes: Adequate for Discharge   RN Treatment Plan for Primary Diagnosis: Severe recurrent major depression with psychotic features (HCC) Long Term Goal(s): Knowledge of disease and therapeutic regimen to maintain health will improve  Short Term Goals: Ability to demonstrate self-control, Ability to verbalize feelings will improve, Ability to identify and develop effective coping behaviors will improve and Compliance with prescribed medications will improve  Medication Management: RN will administer medications as ordered by provider, will assess and evaluate patient's response and provide education to patient for prescribed  medication. RN will report any adverse and/or side effects to prescribing provider.  Therapeutic Interventions: 1 on 1 counseling sessions, Psychoeducation, Medication administration, Evaluate responses to treatment, Monitor vital signs and CBGs as ordered, Perform/monitor CIWA, COWS, AIMS and Fall Risk screenings as ordered, Perform wound care treatments as ordered.  Evaluation of Outcomes: Adequate for Discharge   LCSW Treatment Plan for Primary Diagnosis: Severe recurrent major depression with psychotic features (HCC) Long Term Goal(s): Safe transition to appropriate next level of care at discharge, Engage patient in therapeutic group addressing interpersonal concerns.  Short Term Goals: Engage patient in aftercare planning with referrals and resources, Increase social support, Identify triggers associated with mental health/substance abuse issues and Increase skills for wellness and recovery  Therapeutic Interventions: Assess for all discharge needs, 1 to 1 time with Social worker, Explore available resources and support systems, Assess for adequacy in community support network, Educate family and significant other(s) on suicide prevention, Complete Psychosocial Assessment, Interpersonal group therapy.  Evaluation of Outcomes: Adequate for Discharge   Progress in Treatment: Attending groups: Yes. Participating in groups: Yes. Taking medication as prescribed: Yes. Toleration medication: Yes. Family/Significant other contact made: Yes, individual(s) contacted:  mother, Sherlon HandingJoyce Benfer Patient understands diagnosis: Yes. Discussing patient identified problems/goals with staff: Yes. Medical problems stabilized or resolved: Yes. Denies suicidal/homicidal ideation: Yes. Issues/concerns per patient self-inventory: No. Other: none  New problem(s) identified: No, Describe:  none  New Short Term/Long Term Goal(s):   Patient Goals:  "to be stable"  Discharge Plan or Barriers:   Reason for  Continuation of Hospitalization: Anxiety Depression Medication stabilization  Estimated Length of Stay:  Attendees: Patient: Don BroachOctavia Stratton 07/11/2018  Physician: Dr. Jennet MaduroPucilowska, MD  07/11/2018   Nursing: Ty, RN 07/11/2018   RN Care Manager: 07/11/2018   Social Worker: Penni HomansMichaela Nahom Carfagno, LCSW SebekaGreg Weirda, LCSW 07/11/2018   Recreational Therapist: Garret ReddishShay Outlaw 07/11/2018   Other:  07/11/2018   Other:  07/11/2018   Other: 07/11/2018     Scribe for Treatment Team: Harden MoMichaela J Diamonte Stavely, LCSW 07/11/2018 11:59 AM

## 2018-07-11 NOTE — Progress Notes (Signed)
Patient ID: Tara GuilesOctavia Nicole Raymond, female   DOB: 1991/03/30, 27 y.o.   MRN: [id number]   Discharge Note:  Patient denies SI/HI/AVH at this time. Discharge instructions, AVS, prescriptions, and transition record gone over with patient. Patient agrees to comply with medication management, follow-up visit, and outpatient therapy. Patient belongings returned to patient. Patient questions and concerns addressed and answered. Patient ambulatory off unit. Patient discharged to home with mother.

## 2018-07-11 NOTE — [facility] Suicide Risk Assessment (Signed)
[facility] INPATIENT:  Family/Significant Other Suicide Prevention Education  Suicide Prevention Education:  Education Completed; Sherlon HandingJoyce Snuffer, mother, [phone], has been identified by the patient as the family member/significant other with whom the patient will be residing, and identified as the person(s) who will aid the patient in the event of a mental health crisis (suicidal ideations/suicide attempt).  With written consent from the patient, the family member/significant other has been provided the following suicide prevention education, prior to the and/or following the discharge of the patient.  The suicide prevention education provided includes the following:  Suicide risk factors  Suicide prevention and interventions  National Suicide Hotline telephone number  [facility] assessment telephone number  [facility] Emergency Assistance 911  [facility] and/or Residential Mobile Crisis Unit telephone number  Request made of family/significant other to:  Remove weapons (e.g., guns, rifles, knives), all items previously/currently identified as safety concern.  No guns in the home, per mother.    Remove drugs/medications (over-the-counter, prescriptions, illicit drugs), all items previously/currently identified as a safety concern.  The family member/significant other verbalizes understanding of the suicide prevention education information provided.  The family member/significant other agrees to remove the items of safety concern listed above.  Lorri FrederickWierda, Zebastian Carico Jon, LCSW 07/11/2018, 10:31 AM

## 2018-07-11 NOTE — Progress Notes (Signed)
Recreation Therapy Notes  Date: 07/11/2018  Time: 9:30 am  Location: Craft Room  Behavioral response: Appropriate   Intervention Topic: Coping Skills  Discussion/Intervention:  Group content on today was focused on coping skills. The group defined what coping skills are and when they can be used. Individuals described how they normally cope with thing and the coping skills they normally use. Patients expressed why it is important to cope with things and how not coping with things can affect you. The group participated in the intervention "My coping box" and made coping boxes while adding coping skills they could use in the future to the box. Clinical Observations/Feedback:  Patient came to group and defined coping skills as ways to deal with what is going on. She identified her coping skills as talking and deep breathing. Participant stated coping is important because it helps deal with everyday life. Individual was social with peers and staff while participating in the intervention. Patient was pulled from group by peers support and never returned.  Tara Raymond LRT/CTRS         Satvik Parco 07/11/2018 11:55 AM

## 2018-07-11 NOTE — Progress Notes (Signed)
D- Patient alert and oriented. Patient presents in a pleasant mood on assessment stating that she slept "better" last night. Patient stated that she had generalized pain all over, rating her pain level a "9/10", in which she received her scheduled methadone. Patient rated her depression a "6/10" stating that "it's getting better and I'm going home today". Patient denies SI, HI, AVH, as well as any signs/symptoms of anxiety at this time. Patient had no stated goals for today.  A- Scheduled medications administered to patient, per MD orders. Support and encouragement provided.  Routine safety checks conducted every 15 minutes.  Patient informed to notify staff with problems or concerns.  R- No adverse drug reactions noted. Patient contracts for safety at this time. Patient compliant with medications and treatment plan. Patient receptive, calm, and cooperative. Patient interacts well with others on the unit.  Patient remains safe at this time.

## 2018-08-19 ENCOUNTER — Emergency Department
Admission: EM | Admit: 2018-08-19 | Discharge: 2018-08-19 | Discharge status: Against Medical Advice | Payer: Medicaid Other

## 2018-08-19 NOTE — ED Triage Notes (Signed)
Pt called from [facility] to triage, no response 

## 2018-08-31 ENCOUNTER — Emergency Department
Admission: EM | Admit: 2018-08-31 | Discharge: 2018-09-01 | Disposition: A | Discharge status: Home / Self Care | Payer: Medicaid Other | Attending: Emergency Medicine | Admitting: Emergency Medicine

## 2018-08-31 ENCOUNTER — Other Ambulatory Visit: Payer: Self-pay

## 2018-08-31 DIAGNOSIS — F17210 Nicotine dependence, cigarettes, uncomplicated: Secondary | ICD-10-CM | POA: Insufficient documentation

## 2018-08-31 DIAGNOSIS — F329 Major depressive disorder, single episode, unspecified: Secondary | ICD-10-CM | POA: Insufficient documentation

## 2018-08-31 DIAGNOSIS — T401X4A Poisoning by heroin, undetermined, initial encounter: Secondary | ICD-10-CM | POA: Insufficient documentation

## 2018-08-31 DIAGNOSIS — R52 Pain, unspecified: Secondary | ICD-10-CM | POA: Insufficient documentation

## 2018-08-31 DIAGNOSIS — Z046 Encounter for general psychiatric examination, requested by authority: Secondary | ICD-10-CM | POA: Insufficient documentation

## 2018-08-31 DIAGNOSIS — T40601A Poisoning by unspecified narcotics, accidental (unintentional), initial encounter: Secondary | ICD-10-CM

## 2018-08-31 DIAGNOSIS — F1994 Other psychoactive substance use, unspecified with psychoactive substance-induced mood disorder: Secondary | ICD-10-CM | POA: Diagnosis present

## 2018-08-31 DIAGNOSIS — R451 Restlessness and agitation: Secondary | ICD-10-CM | POA: Insufficient documentation

## 2018-08-31 DIAGNOSIS — F1420 Cocaine dependence, uncomplicated: Secondary | ICD-10-CM | POA: Diagnosis present

## 2018-08-31 DIAGNOSIS — F322 Major depressive disorder, single episode, severe without psychotic features: Secondary | ICD-10-CM | POA: Diagnosis present

## 2018-08-31 LAB — COMPREHENSIVE METABOLIC PANEL
ALT: 46 U/L — ABNORMAL HIGH (ref 0–44)
ANION GAP: 7 (ref 5–15)
AST: 48 U/L — ABNORMAL HIGH (ref 15–41)
Albumin: 3.9 g/dL (ref 3.5–5.0)
Alkaline Phosphatase: 51 U/L (ref 38–126)
BUN: 16 mg/dL (ref 6–20)
CALCIUM: 8.6 mg/dL — AB (ref 8.9–10.3)
CO2: 24 mmol/L (ref 22–32)
Chloride: 107 mmol/L (ref 98–111)
Creatinine, Ser: 0.77 mg/dL (ref 0.44–1.00)
GFR calc Af Amer: >60 mL/min (ref >60)
GFR calc non Af Amer: >60 mL/min (ref >60)
Glucose, Bld: 92 mg/dL (ref 70–99)
Potassium: 3.9 mmol/L (ref 3.5–5.1)
Sodium: 138 mmol/L (ref 135–145)
Total Bilirubin: 0.4 mg/dL (ref 0.3–1.2)
Total Protein: 6.8 g/dL (ref 6.5–8.1)

## 2018-08-31 LAB — ETHANOL: Alcohol, Ethyl (B): <10 mg/dL (ref <10)

## 2018-08-31 LAB — CBC
HCT: 33.7 % — ABNORMAL LOW (ref 36.0–46.0)
Hemoglobin: 10.7 g/dL — ABNORMAL LOW (ref 12.0–15.0)
MCH: 28.4 pg (ref 26.0–34.0)
MCHC: 31.8 g/dL (ref 30.0–36.0)
MCV: 89.4 fL (ref 80.0–100.0)
Platelets: 246 10*3/uL (ref 150–400)
RBC: 3.77 MIL/uL — ABNORMAL LOW (ref 3.87–5.11)
RDW: 13.9 % (ref 11.5–15.5)
WBC: 6.7 10*3/uL (ref 4.0–10.5)
nRBC: 0 % (ref 0.0–0.2)

## 2018-08-31 LAB — SALICYLATE LEVEL

## 2018-08-31 LAB — ACETAMINOPHEN LEVEL: Acetaminophen (Tylenol), Serum: <10 ug/mL — ABNORMAL LOW (ref 10–30)

## 2018-08-31 NOTE — ED Notes (Signed)
Pt. Currently sleeping in room.  Pt. Finished eating snack.

## 2018-08-31 NOTE — ED Notes (Addendum)
Pt dressed out at this time by Sula Soda, and this RN. Pt belongings placed in pt belonging bags. House coat, black underwear, burgundy tank top, watch, metal bracelet, hair band, one ring with pink stone. Pt now in burgundy scrubs.

## 2018-08-31 NOTE — ED Notes (Signed)
Patients mother is Lewellyn Standley [phone]

## 2018-08-31 NOTE — ED Provider Notes (Addendum)
[facility] Emergency Department Provider Note  ____________________________________________  Time seen: Approximately 8:39 PM  I have reviewed the triage vital signs and the nursing notes.   HISTORY  Chief Complaint Drug Overdose    HPI Tara Raymond is a 28 y.o. female the history of polysubstance abuse and substance induced mood disorder presenting for heroin overdose.  The patient reports that she was using heroin and then does not remember what happened.  Per report, her mother found her unresponsive with agonal breathing and called EMS.  The patient was given Narcan with good success, return to normal mental status, and stable vital signs.  The patient denies intentional harm; however, I have spoken with the police officer who is at the scene, who reports that mom was concerned about possible intentional overdose.  Apparently, they both have an appointment to be seen in the methadone clinic tomorrow, and the mother told the officer that "she wants to avoid all that."  Patient has no medical complaints at this time.  Past Medical History:  Diagnosis Date  . Anemia   . Cocaine use disorder (HCC)   . Dyspnea    with pregnancy  . Mental disorder   . Opioid use disorder, moderate, dependence (HCC)   . Substance induced mood disorder [facility])     Patient Active Problem List   Diagnosis Date Noted  . Tobacco use disorder 07/09/2018  . Suicidal ideation 07/08/2018  . Overdose of benzodiazepine 07/08/2018  . Severe recurrent major depression with psychotic features (HCC) 07/08/2018  . Alcohol use disorder, moderate, dependence (HCC) 04/03/2018  . Substance abuse (HCC) 04/02/2018  . Encounter for maternal care for low transverse scar from previous cesarean delivery 04/20/2017  . Indication for care in labor or delivery 04/17/2017  . Anemia associated with acute blood loss 02/13/2016  . Postpartum care following cesarean delivery 02/12/2016  . Major  depression, single episode 02/12/2016  . Postoperative anemia due to acute blood loss 02/12/2016  . Adjustment disorder with depressed mood 02/11/2016  . Substance abuse affecting pregnancy in third trimester, antepartum 02/09/2016  . Labor and delivery, indication for care 02/03/2016  . Substance induced mood disorder (HCC) 01/28/2016  . Severe major depression, single episode, without psychotic features (HCC) 01/28/2016  . Cocaine use disorder, moderate, dependence (HCC) 01/28/2016  . Opioid use disorder, mild, in sustained remission, on maintenance therapy (HCC) 01/28/2016  . Opiate withdrawal (HCC) 01/28/2016    Past Surgical History:  Procedure Laterality Date  . CESAREAN SECTION N/A 02/09/2016   Procedure: CESAREAN SECTION;  Surgeon: Nadara Mustard, MD;  Location: [facility] ORS;  Service: Obstetrics;  Laterality: N/A;  . CESAREAN SECTION N/A 04/20/2017   Procedure: CESAREAN SECTION;  Surgeon: Vena Austria, MD;  Location: [facility] ORS;  Service: Obstetrics;  Laterality: N/A;    Current Outpatient Rx  . Order #: [id number] Class: Print  . Order #: [id number] Class: Historical Med  . Order #: [id number] Class: Print  . Order #: [id number] Class: Print    Allergies Naproxen  Family History  Problem Relation Age of Onset  . Drug abuse Mother     Social History Social History   Tobacco Use  . Smoking status: Current Every Day Smoker    Packs/day: 0.50    Types: Cigarettes  . Smokeless tobacco: Current User  Substance Use Topics  . Alcohol use: Yes  . Drug use: Yes    Frequency: 2.0 times per week    Types: Cocaine, Marijuana, Heroin    Comment: heroin  Review of Systems Constitutional: No fever/chills.  Said of unresponsiveness, now resolved. Eyes: No visual changes. ENT: No sore throat. No congestion or rhinorrhea. Cardiovascular: Denies chest pain. Denies palpitations. Respiratory: Agonal breathing. Gastrointestinal: No abdominal pain.  No nausea, no vomiting.  No  diarrhea.  No constipation. Genitourinary: Negative for dysuria. Musculoskeletal: Negative for back pain. Skin: Negative for rash. Neurological: Negative for headaches. No focal numbness, tingling or weakness.     ____________________________________________   PHYSICAL EXAM:  VITAL SIGNS: ED Triage Vitals  Enc Vitals Group     BP 08/31/18 2017 (!) 91/46     Pulse Rate 08/31/18 2017 98     Resp 08/31/18 2017 (!) 23     Temp 08/31/18 2017 98.4 F (36.9 C)     Temp Source 08/31/18 2017 Oral     SpO2 08/31/18 2009 98 %     Weight 08/31/18 2018 120 lb (54.4 kg)     Height 08/31/18 2018 4\' 9"  (1.448 m)     Head Circumference --      Peak Flow --      Pain Score 08/31/18 2018 0     Pain Loc --      Pain Edu? --      Excl. in GC? --     Constitutional: Alert and oriented.  Answers questions appropriately.  The patient is ambulatory, alert, and answering questions appropriately.  GCS is 15. Eyes: Conjunctivae are normal.  EOMI. No scleral icterus. Head: Atraumatic. Nose: No congestion/rhinnorhea. Mouth/Throat: Mucous membranes are moist.  Neck: No stridor.  Supple.   Cardiovascular: Normal rate, regular rhythm. No murmurs, rubs or gallops.  Respiratory: Normal respiratory effort.  No accessory muscle use or retractions. Lungs CTAB.  No wheezes, rales or ronchi. Musculoskeletal: No LE edema.  Neurologic:  A&Ox3.  Speech is clear.  Face and smile are symmetric.  EOMI.  Moves all extremities well. Skin:  Skin is warm, dry and intact. No rash noted. Psychiatric: The patient is mildly agitated but able to be calmed with verbal reassurance.  She denies SI, HI or loose Nations on my exam.  ____________________________________________   LABS (all labs ordered are listed, but only abnormal results are displayed)  Labs Reviewed  COMPREHENSIVE METABOLIC PANEL  ETHANOL  SALICYLATE LEVEL  ACETAMINOPHEN LEVEL  CBC  URINE DRUG SCREEN, QUALITATIVE ([facility] ONLY)  CBG MONITORING, ED   POC URINE PREG, ED   ____________________________________________  EKG  ED ECG REPORT I, Anne-Caroline Sharma CovertNorman, the attending physician, personally viewed and interpreted this ECG.   Date: 08/31/2018  EKG Time: 2011  Rate: 80  Rhythm: normal sinus rhythm  Axis: normal  Intervals:none  ST&T Change: No STEMI  ____________________________________________  RADIOLOGY  No results found.  ____________________________________________   PROCEDURES  Procedure(s) performed: None  Procedures  Critical Care performed: No ____________________________________________   INITIAL IMPRESSION / ASSESSMENT AND PLAN / ED COURSE  Pertinent labs & imaging results that were available during my care of the patient were reviewed by me and considered in my medical decision making (see chart for details).  28 y.o. female with a history of polysubstance abuse presenting for heroin overdose, responsive to Narcan.  Overall, the patient is mildly hypotensive at 91/46, but this may be due to her small stature.  We will give her oral hydration, and recheck her blood pressure.  She has does not have any other signs that would be concerning for severe hypokalemia, dehydration.  We will get all the basic laboratory studies, and the  patient has been placed under IVC until we are able to fully elucidate whether her overdose was intentional or accidental.  A TTS consult has been placed.  The patient will be observed in the behavioral side of the emergency department and I anticipate medical clearance by 10 PM.  ----------------------------------------- 9:56 PM on 08/31/2018 -----------------------------------------  Reevaluated the patient who continues to have stable vital signs, and is mentating normally.  At this time, the patient is medically cleared for psychiatric disposition.  ____________________________________________  FINAL CLINICAL IMPRESSION(S) / ED DIAGNOSES  Final diagnoses:   Diacetylmorphine overdose, undetermined intent, initial encounter [facility])         NEW MEDICATIONS STARTED DURING THIS VISIT:  New Prescriptions   No medications on file      Rockne Menghini, MD 08/31/18 2044    Rockne Menghini, MD 08/31/18 2156

## 2018-08-31 NOTE — ED Triage Notes (Signed)
Pt arrives via ems from home where mother called ems. Ems states that pt admitted to heroin use. When ems arrived to scene, pt was on floor with agonal breathing, nonresponsive to pain at that time. With pin point pupils. Ems states pt was given 5mg  nasal narcan. Pt a&o x 4 on arrival, being argumentative and disrespectful to staff about care. PD present in room at this time

## 2018-08-31 NOTE — ED Notes (Signed)
Pt. Will not wake up enough to talk to TTS.

## 2018-08-31 NOTE — ED Notes (Signed)
Pt states she is not suicidal or homicidal at this time. Was not attempting to take life when she overdosed.

## 2018-08-31 NOTE — ED Notes (Signed)
Pt. Requested and was given meal tray and additional blankets.  Patients bed adjusted to comfort.  Pt. Also given tv remote to watch tv.  Pt. Calm and cooperative at this time.

## 2018-08-31 NOTE — ED Notes (Signed)
Pt sitting on toilet at this, arguing with staff about situation

## 2018-09-01 DIAGNOSIS — T40601S Poisoning by unspecified narcotics, accidental (unintentional), sequela: Secondary | ICD-10-CM

## 2018-09-01 DIAGNOSIS — F1994 Other psychoactive substance use, unspecified with psychoactive substance-induced mood disorder: Secondary | ICD-10-CM

## 2018-09-01 DIAGNOSIS — F1420 Cocaine dependence, uncomplicated: Secondary | ICD-10-CM

## 2018-09-01 DIAGNOSIS — T40601A Poisoning by unspecified narcotics, accidental (unintentional), initial encounter: Secondary | ICD-10-CM

## 2018-09-01 DIAGNOSIS — F1310 Sedative, hypnotic or anxiolytic abuse, uncomplicated: Secondary | ICD-10-CM

## 2018-09-01 DIAGNOSIS — F322 Major depressive disorder, single episode, severe without psychotic features: Secondary | ICD-10-CM

## 2018-09-01 LAB — URINE DRUG SCREEN, QUALITATIVE (ARMC ONLY)
Amphetamines, Ur Screen: NOT DETECTED
BENZODIAZEPINE, UR SCRN: POSITIVE — AB
Barbiturates, Ur Screen: NOT DETECTED
Cannabinoid 50 Ng, Ur ~~LOC~~: POSITIVE — AB
Cocaine Metabolite,Ur ~~LOC~~: NOT DETECTED
MDMA (Ecstasy)Ur Screen: NOT DETECTED
METHADONE SCREEN, URINE: NOT DETECTED
Opiate, Ur Screen: POSITIVE — AB
Phencyclidine (PCP) Ur S: NOT DETECTED
Tricyclic, Ur Screen: POSITIVE — AB

## 2018-09-01 MED ORDER — ACETAMINOPHEN 500 MG PO TABS
1000.0000 mg | ORAL_TABLET | Freq: Once | ORAL | Status: AC
  Administered 2018-09-01: 1000 mg via ORAL

## 2018-09-01 MED ORDER — ACETAMINOPHEN 325 MG PO TABS
650.0000 mg | ORAL_TABLET | Freq: Once | ORAL | Status: DC
  Filled 2018-09-01: qty 2

## 2018-09-01 MED ORDER — ACETAMINOPHEN 500 MG PO TABS
ORAL_TABLET | ORAL | Status: AC
  Filled 2018-09-01: qty 2

## 2018-09-01 NOTE — BH Assessment (Signed)
TTS attempted assessment however, pt was unable to stay conscious for the interview. Pt not yet appropriate for assessment.

## 2018-09-01 NOTE — ED Notes (Signed)
Pt. Woke up and stated "I hurt all over".  MD informed, orders given.

## 2018-09-01 NOTE — BH Assessment (Signed)
Pt. Loud and argumentative.  In concluding assessment and after I gave her a phone to use was stating "I'm about to get sedated." "What do I need to do to get sedated, how do I get sedated." When I asked her why she was asking that, she said they told her that earlier when she was acting up.  I inquired about her desire for treatment and explained the plan of treatment.  She told me my voice was bothering her, and as I was leaving, stated "Get the fuck out of my room".  After I left, she through her food tray out of the room onto the floor and closed the door.  She then got up and threw her cup out of the room.    Tara Raymond Kathalene Frames, PhD, [facility], LCAS 09/01/2018 9:47 AM

## 2018-09-01 NOTE — ED Notes (Signed)
Dr. Maurer and TTS at bedside. Maintained on 15 minute checks and observation by security camera for safety. 

## 2018-09-01 NOTE — Consult Note (Signed)
[facility] Face-to-Face Psychiatry Consult   Reason for Consult: Opiate overdose Referring Physician: Dr. Alphonzo Lemmings Patient Identification: Tara Raymond MRN:  [id number] Principal Diagnosis: Opiate overdose [facility]) Diagnosis:  Principal Problem:   Opiate overdose (HCC) Active Problems:   Substance induced mood disorder (HCC)   Severe major depression, single episode, without psychotic features (HCC)   Cocaine use disorder, moderate, dependence (HCC)   Total Time spent with patient: 1 hour Patient was seen, chart is reviewed. Last hospital admission:  07/09/2019 -07/12/2019 with recommended changes: #Mood, improved, psychosis resolved -Prozac 20 mg daily -Seroquelto 300mg  nightly -Trazodone 100 mg nightly PRN #Opioid dependence on Methadone -Methadone 80 mg daily, no Rx given    Subjective: "I do not know with a slip to me.  I am not going to hang out with those people anymore."  HPI: Tara Raymond is a 28 y.o. female patient with history of polysubstance abuse and substance induced mood disorder presenting for heroin overdose.  The patient reports that she was using heroin and then does not remember what happened.  Per report, her mother found her unresponsive with agonal breathing and called EMS.  The patient was given Narcan with good success, return to normal mental status, and stable vital signs.  The patient denies intentional harm; however, I have spoken with the police officer who is at the scene, who reports that mom was concerned about possible intentional overdose.  Apparently, they both have an appointment to be seen in the methadone clinic tomorrow, and the mother told the officer that "she wants to avoid all that."  Patient has no medical complaints at this time.  Patient is seen with TTS, Tara Raymond, please see her notes for further evaluation and patient interactions.  On evaluation, patient is agitated.  She denies overdose, and blames her friends for giving  her something.  She denies that she has an addiction problem and reports that she has been tapering appropriately in methadone clinic.  Patient reports that her mood has been improved since her last psychiatric hospitalization at [facility] from July 08, 2018 until July 11, 2018.  Patient reports that she has been compliant with medication since discharge.  Patient currently has somatic complaints of withdrawal symptoms since she has not been dosed her methadone today.  She is requesting discharge and orders that she can go to methadone clinic and continuing treatment.  Patient gets frustrated with the time it takes for evaluation, but calms after she is reassured that she will be able to be discharged.  Patient denies any suicidal ideation, plan or intent.  She denies any homicidal ideation.  She is denying any auditory or visual hallucinations.  Attempt to contact mother for collateral to express her concerns, and mother does not want to speak to this Clinical research associate.  Patient is able to call another family member for a ride home.  Past Psychiatric History: Patient has been seen several times before by mental health but usually in the context of opiate abuse and withdrawal.  Last admission at [facility] December 27 through July 11, 2018.  Unclear if she is ever been on any psychiatric medicine in the past.  Patient denies ever having tried to kill herself in the past.  Previous suicidal ideation usually just in the context of intoxication.  Risk to Self:  Yes in the context of polysubstance abuse Risk to Others:  Denies Prior Inpatient Therapy:  Yes, last at [facility] December 27 through July 11, 2018 Prior Outpatient Therapy:  Questionable compliance with outpatient psychiatry, patient states she is in compliance with methadone clinic.  Past Medical History:  Past Medical History:  Diagnosis Date  . Anemia   . Cocaine use disorder (HCC)   . Dyspnea    with pregnancy  . Mental  disorder   . Opioid use disorder, moderate, dependence (HCC)   . Substance induced mood disorder [facility])     Past Surgical History:  Procedure Laterality Date  . CESAREAN SECTION N/A 02/09/2016   Procedure: CESAREAN SECTION;  Surgeon: Nadara Mustard, MD;  Location: [facility] ORS;  Service: Obstetrics;  Laterality: N/A;  . CESAREAN SECTION N/A 04/20/2017   Procedure: CESAREAN SECTION;  Surgeon: Vena Austria, MD;  Location: [facility] ORS;  Service: Obstetrics;  Laterality: N/A;   Family History:  Family History  Problem Relation Age of Onset  . Drug abuse Mother    Family Psychiatric  History: Positive for substance abuse  Social History:  Social History   Substance and Sexual Activity  Alcohol Use Yes     Social History   Substance and Sexual Activity  Drug Use Yes  . Frequency: 2.0 times per week  . Types: Cocaine, Marijuana, Heroin   Comment: heroin    Social History   Socioeconomic History  . Marital status: Single    Spouse name: Not on file  . Number of children: Not on file  . Years of education: Not on file  . Highest education level: Not on file  Occupational History  . Not on file  Social Needs  . Financial resource strain: Not on file  . Food insecurity:    Worry: Not on file    Inability: Not on file  . Transportation needs:    Medical: Not on file    Non-medical: Not on file  Tobacco Use  . Smoking status: Current Every Day Smoker    Packs/day: 0.50    Types: Cigarettes  . Smokeless tobacco: Current User  Substance and Sexual Activity  . Alcohol use: Yes  . Drug use: Yes    Frequency: 2.0 times per week    Types: Cocaine, Marijuana, Heroin    Comment: heroin  . Sexual activity: Yes  Lifestyle  . Physical activity:    Days per week: Not on file    Minutes per session: Not on file  . Stress: Not on file  Relationships  . Social connections:    Talks on phone: Not on file    Gets together: Not on file    Attends religious service: Not on file     Active member of club or organization: Not on file    Attends meetings of clubs or organizations: Not on file    Relationship status: Not on file  Other Topics Concern  . Not on file  Social History Narrative  . Not on file   Additional Social History:  Patient is living with her mother.  She has a couple of children living but neither of them are in her custody.    Substance abuse history: History of opiate abuse now attending the methadone clinic and says that since taking her methadone she is not getting back on the narcotics although she does take Xanax off the street and smokes marijuana.  Denies alcohol abuse.     Allergies:   Allergies  Allergen Reactions  . Naproxen Hives    Labs:  Results for orders placed or performed during the hospital encounter of 08/31/18 (from the past 48 hour(s))  Comprehensive metabolic panel     Status: Abnormal   Collection Time: 08/31/18  8:14 PM  Result Value Ref Range   Sodium 138 135 - 145 mmol/L   Potassium 3.9 3.5 - 5.1 mmol/L    Comment: HEMOLYSIS AT THIS LEVEL MAY AFFECT RESULT   Chloride 107 98 - 111 mmol/L   CO2 24 22 - 32 mmol/L   Glucose, Bld 92 70 - 99 mg/dL   BUN 16 6 - 20 mg/dL   Creatinine, Ser 1.38 0.44 - 1.00 mg/dL   Calcium 8.6 (L) 8.9 - 10.3 mg/dL   Total Protein 6.8 6.5 - 8.1 g/dL   Albumin 3.9 3.5 - 5.0 g/dL   AST 48 (H) 15 - 41 U/L    Comment: HEMOLYSIS AT THIS LEVEL MAY AFFECT RESULT   ALT 46 (H) 0 - 44 U/L   Alkaline Phosphatase 51 38 - 126 U/L   Total Bilirubin 0.4 0.3 - 1.2 mg/dL    Comment: HEMOLYSIS AT THIS LEVEL MAY AFFECT RESULT   GFR calc non Af Amer >60 >60 mL/min   GFR calc Af Amer >60 >60 mL/min   Anion gap 7 5 - 15    Comment: Performed at [facility], [address] [place], [place] [address]  Ethanol     Status: None   Collection Time: 08/31/18  8:14 PM  Result Value Ref Range   Alcohol, Ethyl (B) <10 <10 mg/dL    Comment: (NOTE) Lowest detectable limit for serum alcohol is 10  mg/dL. For medical purposes only. Performed at [facility], [address] [place], [place] [address]   Salicylate level     Status: None   Collection Time: 08/31/18  8:14 PM  Result Value Ref Range   Salicylate Lvl <7.0 2.8 - 30.0 mg/dL    Comment: Performed at [facility], [address] [place], [place] [address]  Acetaminophen level     Status: Abnormal   Collection Time: 08/31/18  8:14 PM  Result Value Ref Range   Acetaminophen (Tylenol), Serum <10 (L) 10 - 30 ug/mL    Comment: (NOTE) Therapeutic concentrations vary significantly. A range of 10-30 ug/mL  may be an effective concentration for many patients. However, some  are best treated at concentrations outside of this range. Acetaminophen concentrations >150 ug/mL at 4 hours after ingestion  and >50 ug/mL at 12 hours after ingestion are often associated with  toxic reactions. Performed at [facility], [address] [place], [place] [address]   cbc     Status: Abnormal   Collection Time: 08/31/18  8:14 PM  Result Value Ref Range   WBC 6.7 4.0 - 10.5 K/uL   RBC 3.77 (L) 3.87 - 5.11 MIL/uL   Hemoglobin 10.7 (L) 12.0 - 15.0 g/dL   HCT 57.4 (L) 93.5 - 52.1 %   MCV 89.4 80.0 - 100.0 fL   MCH 28.4 26.0 - 34.0 pg   MCHC 31.8 30.0 - 36.0 g/dL   RDW 74.7 15.9 - 53.9 %   Platelets 246 150 - 400 K/uL   nRBC 0.0 0.0 - 0.2 %    Comment: Performed at [facility], [address] [place], [place] [address]    Current Facility-Administered Medications  Medication Dose Route Frequency Provider Last Rate Last Dose  . acetaminophen (TYLENOL) tablet 650 mg  650 mg Oral Once Jeanmarie Plant, MD       Current Outpatient Medications  Medication Sig Dispense Refill  .  FLUoxetine (PROZAC) 20 MG capsule Take 1 capsule (20 mg total) by mouth daily. 30 capsule 3  . methadone (DOLOPHINE) 10 MG tablet Take 80 mg by mouth daily.    . QUEtiapine (SEROQUEL) 300 MG tablet Take 1 tablet (300 mg  total) by mouth at bedtime. 30 tablet 1  . traZODone (DESYREL) 100 MG tablet Take 1 tablet (100 mg total) by mouth at bedtime as needed for sleep. 30 tablet 1    Musculoskeletal: Strength & Muscle Tone: within normal limits Gait & Station: normal Patient leans: N/A  Psychiatric Specialty Exam: Physical Exam  Nursing note and vitals reviewed. Constitutional: She appears well-developed and well-nourished.  HENT:  Head: Normocephalic and atraumatic.  Eyes: Pupils are equal, round, and reactive to light. Conjunctivae are normal.  Neck: Normal range of motion.  Cardiovascular: Regular rhythm and normal heart sounds.  Respiratory: No respiratory distress.  GI: Soft.  Musculoskeletal: Normal range of motion.  Neurological: She is alert.  Skin: Skin is warm and dry.  Psychiatric: Her affect is angry. She is agitated. Thought content is not paranoid. She expresses impulsivity. She does not exhibit a depressed mood. She expresses no homicidal and no suicidal ideation.    Review of Systems  Constitutional: Negative.   HENT: Negative.   Eyes: Negative.   Respiratory: Negative.   Cardiovascular: Negative.   Gastrointestinal: Negative.   Musculoskeletal: Negative.   Skin: Negative.   Neurological: Negative.   Psychiatric/Behavioral: Positive for substance abuse ( ). Negative for depression, hallucinations, memory loss and suicidal ideas. The patient has insomnia. The patient is not nervous/anxious.     Blood pressure 118/78, pulse 74, temperature 98.4 F (36.9 C), temperature source Oral, resp. rate 18, height 4\' 9"  (1.448 m), weight 54.4 kg, last menstrual period 08/22/2018, SpO2 97 %, unknown if currently breastfeeding.Body mass index is 25.97 kg/m.  General Appearance: Disheveled  Eye Contact:  Fair  Speech:  Clear and Coherent  Volume:  Increased  Mood:  Angry  Affect:  Congruent  Thought Process:  Coherent  Orientation:  Full (Time, Place, and Person)  Thought Content:   Logical  Suicidal Thoughts:  No  Homicidal Thoughts:  No  Memory:  Immediate;   Fair Recent;   Poor Remote;   Fair  Judgement:  Fair  Insight:  Fair  Psychomotor Activity:  Restlessness  Concentration:  Concentration: Fair  Recall:  Poor  Fund of Knowledge:  Fair  Language:  Fair  Akathisia:  No  Handed:  Right  AIMS (if indicated):     Assets:  Housing Physical Health Social Support  ADL's:  Intact  Cognition:  Impaired,  Mild  Sleep:   Adequate overnight     Treatment Plan Summary: Tara Raymond is a 28 y.o. female with an extensive history of polysubstance abuse and depression.  She presented to the emergency department after an accidental overdose.  Patient is adamantly denying suicidal and homicidal ideation.  She denies auditory or visual hallucinations.  She is requesting to be discharged in order to get her daily dose of methadone.  Family members are agreeable to picking up patient and ensuring her safety upon discharge. Medication management per outpatient psychiatry and methadone clinic.  Disposition: Patient does not meet criteria for psychiatric inpatient admission. Supportive therapy provided about ongoing stressors. Discussed crisis plan, support from social network, calling 911, coming to the Emergency Department, and calling Suicide Hotline. Patient is able to contract for safety.  Tara CraftSHEILA M Jaamal Farooqui, MD 09/01/2018 9:07 AM

## 2018-09-01 NOTE — ED Notes (Signed)
Pt. States "I did not accidentally overdose on heroin, I thought it was cocaine".  Pt. States I am not suicidal or homicidal.

## 2018-09-01 NOTE — ED Notes (Signed)
Pt observed laying in bed. RN entered room and presented patient with her discharge papers.  Pt signed hard copy of paperwork.  Refused departure VS. Pt had called for a ride at 1030. Maintained on 15 minute checks and observation by security camera for safety.

## 2018-09-01 NOTE — ED Notes (Signed)
Pt threw breakfast tray into hall.   ?

## 2018-09-01 NOTE — ED Notes (Signed)
Pt got up and slammed her door shut. Pt banging her fists on her bed. Maintained on 15 minute checks and observation by security camera for safety.

## 2018-09-01 NOTE — ED Notes (Signed)
Pt yelling and cursing, wanting more medication for pain and to be discharged.  Staff attempted to explain to patent she needs to calm down and speak with psychiatrist.  Maintained on 15 minute checks and observation by security camera for safety.

## 2018-09-01 NOTE — BH Assessment (Signed)
Patient's mother called on the nurse line.  TTS telephoned mother Jeryn Rayborn) as directed by Dr. Wilder Glade.  Mother was argumentative and stating she didn't have time to talk.  She was not interested in TTS gaining collateral information stating she just wanted to talk to her daughter. Mother hung up on TTS.  TTS delivered the patient phone to the patient and gave her the phone number for her mother.  Patient was calm and cooperative initially then stated she was hungry, and "I know you don't give a fuck, but I'm hungry, and your wig is not cute!" Patient continue to talk with her mother on the patient/white phone at this time.    Starla Link Kathalene Frames, PhD, [facility], LCAS 09/01/2018 9:57 AM
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
## 2018-09-01 NOTE — ED Notes (Signed)
Pt dressing for discharge. Maintained on 15 minute checks and observation by security camera for safety. 

## 2018-09-01 NOTE — ED Notes (Signed)
Pt refused Tylenol because "it doesn't help."  "I'm hurting all over because I fell out of my bed."    Maintained on 15 minute checks and observation by security camera for safety.

## 2018-09-01 NOTE — ED Notes (Signed)
Pt given breakfast tray. Pt asking for pain medicine, Amy,RN made aware.

## 2018-09-01 NOTE — ED Provider Notes (Addendum)
Emergency Medicine Observation Re-evaluation Note  Tara Raymond is a 28 y.o. female, seen on rounds today.  Pt initially presented to the ED for complaints of Drug Overdose Currently, the patient is in nad.  Physical Exam  BP 118/78 (BP Location: Right Arm)   Pulse 74   Temp 98.4 F (36.9 C) (Oral)   Resp 18   Ht 4\' 9"  (1.448 m)   Wt 54.4 kg   LMP 08/22/2018   SpO2 97%   BMI 25.97 kg/m  Physical Exam  ED Course / MDM  MCE:YEMV   I have reviewed the labs performed to date as well as medications administered while in observation.  Recent changes in the last 24 hours include  Pt now awake, denies hi or si. However, was ivc for possible family concerns. She does not want to be here. . Plan  Current plan is for  dispo per psych. Patient is under full IVC at this time.   Jeanmarie Plant, MD 09/01/18 [phone]   ----------------------------------------- 9:53 AM on 09/01/2018 -----------------------------------------  Seen in no acute distress, eager to go home, evaluated by psychiatry they have rescinded her IVC family has no ongoing concerns apparently after discussion with them and we will discharge.   Jeanmarie Plant, MD 09/01/18 [phone]

## 2018-09-01 NOTE — ED Notes (Signed)
Pt discharged to lobby.  Refused VS. Pt denies SI/HI.  Discharge paperwork reviewed with patient. Patient signed hard copy of discharge paperwork. All belongings returned to patient.

## 2018-09-02 ENCOUNTER — Emergency Department
Admission: EM | Admit: 2018-09-02 | Discharge: 2018-09-03 | Disposition: A | Discharge status: Other Acute Inpatient Hospital | Payer: No Typology Code available for payment source | Attending: Emergency Medicine | Admitting: Emergency Medicine

## 2018-09-02 DIAGNOSIS — F333 Major depressive disorder, recurrent, severe with psychotic symptoms: Secondary | ICD-10-CM | POA: Insufficient documentation

## 2018-09-02 DIAGNOSIS — F17210 Nicotine dependence, cigarettes, uncomplicated: Secondary | ICD-10-CM | POA: Insufficient documentation

## 2018-09-02 DIAGNOSIS — Z79899 Other long term (current) drug therapy: Secondary | ICD-10-CM | POA: Insufficient documentation

## 2018-09-02 DIAGNOSIS — F329 Major depressive disorder, single episode, unspecified: Secondary | ICD-10-CM

## 2018-09-02 DIAGNOSIS — F32A Depression, unspecified: Secondary | ICD-10-CM

## 2018-09-02 LAB — ACETAMINOPHEN LEVEL: Acetaminophen (Tylenol), Serum: <10 ug/mL — ABNORMAL LOW (ref 10–30)

## 2018-09-02 LAB — CBC WITH DIFFERENTIAL/PLATELET
Abs Immature Granulocytes: 0.03 10*3/uL (ref 0.00–0.07)
Basophils Absolute: 0 10*3/uL (ref 0.0–0.1)
Basophils Relative: 0 %
Eosinophils Absolute: 0 10*3/uL (ref 0.0–0.5)
Eosinophils Relative: 0 %
HCT: 37.8 % (ref 36.0–46.0)
Hemoglobin: 12.1 g/dL (ref 12.0–15.0)
Immature Granulocytes: 0 %
LYMPHS PCT: 21 %
Lymphs Abs: 1.9 10*3/uL (ref 0.7–4.0)
MCH: 28 pg (ref 26.0–34.0)
MCHC: 32 g/dL (ref 30.0–36.0)
MCV: 87.5 fL (ref 80.0–100.0)
Monocytes Absolute: 0.5 10*3/uL (ref 0.1–1.0)
Monocytes Relative: 6 %
Neutro Abs: 6.5 10*3/uL (ref 1.7–7.7)
Neutrophils Relative %: 73 %
Platelets: 251 10*3/uL (ref 150–400)
RBC: 4.32 MIL/uL (ref 3.87–5.11)
RDW: 13.6 % (ref 11.5–15.5)
WBC: 8.9 10*3/uL (ref 4.0–10.5)
nRBC: 0 % (ref 0.0–0.2)

## 2018-09-02 LAB — COMPREHENSIVE METABOLIC PANEL
ALT: 43 U/L (ref 0–44)
ANION GAP: 7 (ref 5–15)
AST: 41 U/L (ref 15–41)
Albumin: 4.4 g/dL (ref 3.5–5.0)
Alkaline Phosphatase: 54 U/L (ref 38–126)
BUN: 15 mg/dL (ref 6–20)
CALCIUM: 8.5 mg/dL — AB (ref 8.9–10.3)
CO2: 28 mmol/L (ref 22–32)
Chloride: 101 mmol/L (ref 98–111)
Creatinine, Ser: 0.51 mg/dL (ref 0.44–1.00)
GFR calc Af Amer: >60 mL/min (ref >60)
GFR calc non Af Amer: >60 mL/min (ref >60)
Glucose, Bld: 107 mg/dL — ABNORMAL HIGH (ref 70–99)
Potassium: 3.2 mmol/L — ABNORMAL LOW (ref 3.5–5.1)
Sodium: 136 mmol/L (ref 135–145)
Total Bilirubin: 0.5 mg/dL (ref 0.3–1.2)
Total Protein: 7.6 g/dL (ref 6.5–8.1)

## 2018-09-02 LAB — ETHANOL: Alcohol, Ethyl (B): <10 mg/dL (ref <10)

## 2018-09-02 LAB — SALICYLATE LEVEL: Salicylate Lvl: <7 mg/dL (ref 2.8–30.0)

## 2018-09-02 MED ORDER — HALOPERIDOL LACTATE 5 MG/ML IJ SOLN
2.0000 mg | Freq: Once | INTRAMUSCULAR | Status: AC
  Administered 2018-09-02: 2 mg via INTRAMUSCULAR
  Filled 2018-09-02: qty 1

## 2018-09-02 MED ORDER — LORAZEPAM 2 MG/ML IJ SOLN
2.0000 mg | Freq: Once | INTRAMUSCULAR | Status: AC
  Administered 2018-09-02: 2 mg via INTRAMUSCULAR
  Filled 2018-09-02: qty 1

## 2018-09-02 NOTE — ED Provider Notes (Addendum)
[facility] Emergency Department Provider Note   ____________________________________________   None    (approximate)  I have reviewed the triage vital signs and the nursing notes.   HISTORY  Chief Complaint Suicidal   HPI Tara Raymond is a 28 y.o. female patient comes in under IVC for suicidal ideation.  In the emergency room she is combative and wrestling with the officers.  She does agree to lay still after few minutes.  She says she wants a shot to make her calm down.  I will give her 2 of Ativan and 2 of Haldol IM.  She does not want any pills.   Past Medical History:  Diagnosis Date  . Anemia   . Cocaine use disorder (HCC)   . Dyspnea    with pregnancy  . Mental disorder   . Opioid use disorder, moderate, dependence (HCC)   . Substance induced mood disorder [facility])     Patient Active Problem List   Diagnosis Date Noted  . Opiate overdose (HCC) 09/01/2018  . Tobacco use disorder 07/09/2018  . Suicidal ideation 07/08/2018  . Overdose of benzodiazepine 07/08/2018  . Severe recurrent major depression with psychotic features (HCC) 07/08/2018  . Alcohol use disorder, moderate, dependence (HCC) 04/03/2018  . Substance abuse (HCC) 04/02/2018  . Encounter for maternal care for low transverse scar from previous cesarean delivery 04/20/2017  . Indication for care in labor or delivery 04/17/2017  . Anemia associated with acute blood loss 02/13/2016  . Postpartum care following cesarean delivery 02/12/2016  . Major depression, single episode 02/12/2016  . Postoperative anemia due to acute blood loss 02/12/2016  . Adjustment disorder with depressed mood 02/11/2016  . Substance abuse affecting pregnancy in third trimester, antepartum 02/09/2016  . Labor and delivery, indication for care 02/03/2016  . Substance induced mood disorder (HCC) 01/28/2016  . Severe major depression, single episode, without psychotic features (HCC) 01/28/2016  .  Cocaine use disorder, moderate, dependence (HCC) 01/28/2016  . Opioid use disorder, mild, in sustained remission, on maintenance therapy (HCC) 01/28/2016  . Opiate withdrawal (HCC) 01/28/2016    Past Surgical History:  Procedure Laterality Date  . CESAREAN SECTION N/A 02/09/2016   Procedure: CESAREAN SECTION;  Surgeon: Nadara Mustard, MD;  Location: [facility] ORS;  Service: Obstetrics;  Laterality: N/A;  . CESAREAN SECTION N/A 04/20/2017   Procedure: CESAREAN SECTION;  Surgeon: Vena Austria, MD;  Location: [facility] ORS;  Service: Obstetrics;  Laterality: N/A;    Prior to Admission medications   Medication Sig Start Date End Date Taking? Authorizing Provider  FLUoxetine (PROZAC) 20 MG capsule Take 1 capsule (20 mg total) by mouth daily. 07/11/18   Pucilowska, Jolanta B, MD  methadone (DOLOPHINE) 10 MG tablet Take 80 mg by mouth daily.    [provider]  QUEtiapine (SEROQUEL) 300 MG tablet Take 1 tablet (300 mg total) by mouth at bedtime. 07/10/18   Pucilowska, Braulio Conte B, MD  traZODone (DESYREL) 100 MG tablet Take 1 tablet (100 mg total) by mouth at bedtime as needed for sleep. 07/10/18   Pucilowska, Ellin Goodie, MD    Allergies Naproxen  Family History  Problem Relation Age of Onset  . Drug abuse Mother     Social History Social History   Tobacco Use  . Smoking status: Current Every Day Smoker    Packs/day: 0.50    Types: Cigarettes  . Smokeless tobacco: Current User  Substance Use Topics  . Alcohol use: Yes  . Drug use: Yes  Frequency: 2.0 times per week    Types: Cocaine, Marijuana, Heroin    Comment: heroin    Review of Systems  Constitutional: No fever/chills Eyes: No visual changes. ENT: No sore throat. Cardiovascular: Denies chest pain. Respiratory: Denies shortness of breath. Gastrointestinal: No abdominal pain.  No nausea, no vomiting.  No diarrhea.  No constipation. Genitourinary: Negative for dysuria. Musculoskeletal: Negative for back pain. Skin:  Negative for rash. Neurological: Negative for headaches, focal weakness  ____________________________________________   PHYSICAL EXAM:  VITAL SIGNS: ED Triage Vitals  Enc Vitals Group     BP      Pulse      Resp      Temp      Temp src      SpO2      Weight      Height      Head Circumference      Peak Flow      Pain Score      Pain Loc      Pain Edu?      Excl. in GC?     Constitutional: Alert and oriented.  Very dramatic in her actions Eyes: Conjunctivae are normal.  Head: Atraumatic. Nose: No congestion/rhinnorhea. Mouth/Throat: Mucous membranes are moist.  Oropharynx non-erythematous. Neck: No stridor. Cardiovascular: Normal rate, regular rhythm. Grossly normal heart sounds.  Good peripheral circulation. Respiratory: Normal respiratory effort.  No retractions. Lungs CTAB. Gastrointestinal: Soft and nontender. No distention. No abdominal bruits. No CVA tenderness. Musculoskeletal: No lower extremity tenderness nor edema.   Neurologic:  Normal speech and language. No gross focal neurologic deficits are appreciated.  Skin:  Skin is warm, dry and intact. No rash noted.   ____________________________________________   LABS (all labs ordered are listed, but only abnormal results are displayed)  Labs Reviewed  ACETAMINOPHEN LEVEL - Abnormal; Notable for the following components:      Result Value   Acetaminophen (Tylenol), Serum <10 (*)    All other components within normal limits  COMPREHENSIVE METABOLIC PANEL - Abnormal; Notable for the following components:   Potassium 3.2 (*)    Glucose, Bld 107 (*)    Calcium 8.5 (*)    All other components within normal limits  ETHANOL  SALICYLATE LEVEL  CBC WITH DIFFERENTIAL/PLATELET  PREGNANCY, URINE  URINALYSIS, COMPLETE (UACMP) WITH MICROSCOPIC  URINE DRUG SCREEN, QUALITATIVE ([facility] ONLY)   ____________________________________________  EKG   ____________________________________________  RADIOLOGY  ED MD  interpretation:    Official radiology report(s): No results found.  ____________________________________________   PROCEDURES  Procedure(s) performed:   Procedures  Critical Care performed:   ____________________________________________   INITIAL IMPRESSION / ASSESSMENT AND PLAN / ED COURSE Patient awaiting psychiatry.  She is resting peacefully now still somewhat sedated.  Probably have to have psychiatry see her in the morning.  She does have a history of psychotic depression.  Probably this is what is going on now.         ____________________________________________   FINAL CLINICAL IMPRESSION(S) / ED DIAGNOSES  Final diagnoses:  Depression, unspecified depression type     ED Discharge Orders    None       Note:  This document was prepared using Dragon voice recognition software and may include unintentional dictation errors.    Arnaldo Natal, MD 09/02/18 [id number]    Arnaldo Natal, MD 09/02/18 [phone]

## 2018-09-02 NOTE — ED Triage Notes (Signed)
She arrives today via [employer] from home  Pt called 911 for assistance  She verbalizes upon arrival that she wishes to die and she wishes to overdose on pills  Restlessness - anxiety present upon arrival

## 2018-09-02 NOTE — ED Notes (Signed)
Pt dressed out by 2 RN's.  Belongings include: Purse Red wig Multi colored pants Yellow shirt  Pink bra  Pink underwear

## 2018-09-02 NOTE — ED Notes (Signed)
Pt uncooperative and yelling upon arrival to ER with [employer].   EDP at bedside. Pt cooperative with assessment.  IM medications ordered to help patient relax and remain calm.  Medications administered as ordered with patient cooperation. Maintained on 15 minute checks and observation by security camera for safety.

## 2018-09-03 ENCOUNTER — Other Ambulatory Visit: Payer: Self-pay

## 2018-09-03 ENCOUNTER — Encounter: Payer: Self-pay | Admitting: Psychiatry

## 2018-09-03 ENCOUNTER — Inpatient Hospital Stay
Admission: AD | Admit: 2018-09-03 | Discharge: 2018-09-09 | DRG: 885 | Disposition: A | Discharge status: Home / Self Care | Payer: No Typology Code available for payment source | Source: Intra-hospital | Attending: Psychiatry | Admitting: Psychiatry

## 2018-09-03 DIAGNOSIS — F1120 Opioid dependence, uncomplicated: Secondary | ICD-10-CM | POA: Diagnosis present

## 2018-09-03 DIAGNOSIS — F322 Major depressive disorder, single episode, severe without psychotic features: Principal | ICD-10-CM | POA: Diagnosis present

## 2018-09-03 DIAGNOSIS — F419 Anxiety disorder, unspecified: Secondary | ICD-10-CM | POA: Diagnosis present

## 2018-09-03 DIAGNOSIS — G4700 Insomnia, unspecified: Secondary | ICD-10-CM | POA: Diagnosis present

## 2018-09-03 DIAGNOSIS — Z79899 Other long term (current) drug therapy: Secondary | ICD-10-CM

## 2018-09-03 DIAGNOSIS — F13239 Sedative, hypnotic or anxiolytic dependence with withdrawal, unspecified: Secondary | ICD-10-CM | POA: Diagnosis present

## 2018-09-03 DIAGNOSIS — F17210 Nicotine dependence, cigarettes, uncomplicated: Secondary | ICD-10-CM | POA: Diagnosis present

## 2018-09-03 DIAGNOSIS — R45851 Suicidal ideations: Secondary | ICD-10-CM | POA: Diagnosis present

## 2018-09-03 DIAGNOSIS — F329 Major depressive disorder, single episode, unspecified: Secondary | ICD-10-CM | POA: Diagnosis not present

## 2018-09-03 DIAGNOSIS — F1310 Sedative, hypnotic or anxiolytic abuse, uncomplicated: Secondary | ICD-10-CM

## 2018-09-03 DIAGNOSIS — F1124 Opioid dependence with opioid-induced mood disorder: Secondary | ICD-10-CM | POA: Diagnosis present

## 2018-09-03 DIAGNOSIS — F1994 Other psychoactive substance use, unspecified with psychoactive substance-induced mood disorder: Secondary | ICD-10-CM | POA: Diagnosis not present

## 2018-09-03 DIAGNOSIS — F32A Depression, unspecified: Secondary | ICD-10-CM | POA: Insufficient documentation

## 2018-09-03 DIAGNOSIS — Z813 Family history of other psychoactive substance abuse and dependence: Secondary | ICD-10-CM

## 2018-09-03 DIAGNOSIS — N390 Urinary tract infection, site not specified: Secondary | ICD-10-CM | POA: Diagnosis present

## 2018-09-03 LAB — PREGNANCY, URINE: Preg Test, Ur: NEGATIVE

## 2018-09-03 LAB — URINALYSIS, COMPLETE (UACMP) WITH MICROSCOPIC
Bilirubin Urine: NEGATIVE
Glucose, UA: NEGATIVE mg/dL
Hgb urine dipstick: NEGATIVE
Ketones, ur: 20 mg/dL — AB
Nitrite: POSITIVE — AB
Protein, ur: 30 mg/dL — AB
Specific Gravity, Urine: 1.026 (ref 1.005–1.030)
WBC, UA: >50 WBC/hpf — ABNORMAL HIGH (ref 0–5)
pH: 6 (ref 5.0–8.0)

## 2018-09-03 LAB — URINE DRUG SCREEN, QUALITATIVE (ARMC ONLY)
Amphetamines, Ur Screen: NOT DETECTED
Barbiturates, Ur Screen: NOT DETECTED
Benzodiazepine, Ur Scrn: POSITIVE — AB
CANNABINOID 50 NG, UR ~~LOC~~: POSITIVE — AB
Cocaine Metabolite,Ur ~~LOC~~: NOT DETECTED
MDMA (Ecstasy)Ur Screen: NOT DETECTED
Methadone Scn, Ur: NOT DETECTED
Opiate, Ur Screen: POSITIVE — AB
Phencyclidine (PCP) Ur S: NOT DETECTED
Tricyclic, Ur Screen: NOT DETECTED

## 2018-09-03 MED ORDER — ACETAMINOPHEN 500 MG PO TABS
1000.0000 mg | ORAL_TABLET | Freq: Once | ORAL | Status: AC
  Administered 2018-09-03: 1000 mg via ORAL
  Filled 2018-09-03: qty 2

## 2018-09-03 MED ORDER — ALUM & MAG HYDROXIDE-SIMETH 200-200-20 MG/5ML PO SUSP: 30.0000 mL | ORAL | Status: DC | PRN

## 2018-09-03 MED ORDER — ACETAMINOPHEN 325 MG PO TABS
650.0000 mg | ORAL_TABLET | Freq: Four times a day (QID) | ORAL | Status: DC | PRN
  Administered 2018-09-03 – 2018-09-09 (×5): 650 mg via ORAL
  Filled 2018-09-03 (×5): qty 2

## 2018-09-03 MED ORDER — NICOTINE 14 MG/24HR TD PT24
14.0000 mg | MEDICATED_PATCH | Freq: Every day | TRANSDERMAL | Status: DC
  Administered 2018-09-04 – 2018-09-09 (×6): 14 mg via TRANSDERMAL
  Filled 2018-09-03 (×6): qty 1

## 2018-09-03 MED ORDER — HYDROXYZINE HCL 25 MG PO TABS
50.0000 mg | ORAL_TABLET | Freq: Once | ORAL | Status: AC
  Administered 2018-09-03: 50 mg via ORAL
  Filled 2018-09-03: qty 2

## 2018-09-03 MED ORDER — MAGNESIUM HYDROXIDE 400 MG/5ML PO SUSP
30.0000 mL | Freq: Every day | ORAL | Status: DC | PRN
  Administered 2018-09-04 – 2018-09-06 (×2): 30 mL via ORAL
  Filled 2018-09-03 (×3): qty 30

## 2018-09-03 MED ORDER — ACETAMINOPHEN 500 MG PO TABS
1000.0000 mg | ORAL_TABLET | Freq: Once | ORAL | Status: AC
  Administered 2018-09-03: 1000 mg via ORAL

## 2018-09-03 MED ORDER — TRAZODONE HCL 100 MG PO TABS
100.0000 mg | ORAL_TABLET | Freq: Every evening | ORAL | Status: DC | PRN
  Administered 2018-09-03 – 2018-09-08 (×5): 100 mg via ORAL
  Filled 2018-09-03 (×5): qty 1

## 2018-09-03 MED ORDER — HYDROXYZINE HCL 50 MG PO TABS
50.0000 mg | ORAL_TABLET | Freq: Four times a day (QID) | ORAL | Status: DC | PRN
  Administered 2018-09-03 – 2018-09-07 (×7): 50 mg via ORAL
  Filled 2018-09-03 (×7): qty 1

## 2018-09-03 MED ORDER — METHADONE HCL 10 MG PO TABS
60.0000 mg | ORAL_TABLET | Freq: Every day | ORAL | Status: DC
  Administered 2018-09-03 – 2018-09-07 (×5): 60 mg via ORAL
  Filled 2018-09-03 (×5): qty 6

## 2018-09-03 MED ORDER — ACETAMINOPHEN 500 MG PO TABS
ORAL_TABLET | ORAL | Status: AC
  Filled 2018-09-03: qty 2

## 2018-09-03 NOTE — ED Notes (Signed)
Pt awake and stating that she is "anxious and in pain all over."  Pt states, "I'm withdrawing from a whole bunch of drugs."  Infromed pt that she still needed to give a urine sample.  Pt rates pain a 7/10.  ED MD made aware.  New orders received.

## 2018-09-03 NOTE — ED Provider Notes (Signed)
-----------------------------------------   9:57 AM on 09/03/2018 -----------------------------------------  Patient has been seen and evaluated by psychiatry they will be admitting to their service once a bed becomes available.   Minna Antis, MD 09/03/18 [phone]

## 2018-09-03 NOTE — ED Notes (Signed)
Psychiatric Doctor evaluating patient at this time, patient is calm and cooperative.

## 2018-09-03 NOTE — Progress Notes (Signed)
D: Patient sitting in dayroom. Drowsy. Reporting pain 10/10 in her head and asking for sleep medication even though patient appears to be dozing off. Reports chronic passive SI but verbally contracts for safety. Calm and cooperative. A: Continue to monitor for safety.  R: Safety maintained

## 2018-09-03 NOTE — BHH Group Notes (Signed)
LCSW Group Therapy Note   09/03/2018 1:15pm   Type of Therapy and Topic:  Group Therapy:  Trust and Honesty  Participation Level:  Active  Description of Group:    In this group patients will be asked to explore the value of being honest.  Patients will be guided to discuss their thoughts, feelings, and behaviors related to honesty and trusting in others. Patients will process together how trust and honesty relate to forming relationships with peers, family members, and self. Each patient will be challenged to identify and express feelings of being vulnerable. Patients will discuss reasons why people are dishonest and identify alternative outcomes if one was truthful (to self or others). This group will be process-oriented, with patients participating in exploration of their own experiences, giving and receiving support, and processing challenge from other group members.   Therapeutic Goals: 1. Patient will identify why honesty is important to relationships and how honesty overall affects relationships.  2. Patient will identify a situation where they lied or were lied too and the  feelings, thought process, and behaviors surrounding the situation 3. Patient will identify the meaning of being vulnerable, how that feels, and how that correlates to being honest with self and others. 4. Patient will identify situations where they could have told the truth, but instead lied and explain reasons of dishonesty.   Summary of Patient Progress: The patient reported that she feels "okay." The patient was able to explore the value of being honest.  Patient discussed thoughts, feelings, and behaviors related to honesty and trusting in others. The patient processed together with other group members how trust and honesty relate to forming relationships with peers, family members, and self. Pt actively and appropriately engaged in the group. Patient was able to provide support and validation to other group members.  Patient practiced active listening when interacting with the facilitator and other group members.    Therapeutic Modalities:   Cognitive Behavioral Therapy Solution Focused Therapy Motivational Interviewing Brief Therapy  Tara Raymond  CUEBAS-COLON, LCSW 09/03/2018 12:30 PM

## 2018-09-03 NOTE — Progress Notes (Addendum)
Patient refused to leave search room. Patient complied after multiple redirections. Patient was escorted for saftey to patient's room without issue.

## 2018-09-03 NOTE — ED Notes (Signed)
Pt. Woke up and requested meal tray, pt. Given meal tray and drink.  Pt. Also requested additional blanket and remote for tv.  Pt. Given remote and blanket.

## 2018-09-03 NOTE — Consult Note (Signed)
[facility] Face-to-Face Psychiatry Consult   Reason for Consult: Opiate overdose, SI Referring Physician:  Patient Identification: Tara Raymond MRN:  [id number] Principal Diagnosis: <principal problem not specified> Diagnosis:  Active Problems:   * No active hospital problems. *   Total Time spent with patient: 1 hour Patient was seen, chart is reviewed. 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 Pt now reporting suicidal intent of heroin overdose the day prior, also voicing suicidal statement.  Per TTS note - Tara Raymond is an 28 y.o. female. Patient well known to the service with multiple admissions, most presentation  occurring on 09/02/18 . Please refer to most recent assessment for complete psychosocial history. Pt presents today as oriented X4 and with primary complaints of chronic SI. Patient recently discharged for what at the time was considered an unintentional overdose of heroin. At this time the patient states that she's been experiencing suicidal ideations for several months and that thisprevious incident  was actually intentional. Patients responses are extremely inconsistent as she later sates that she's neverhad a plan to harm herself. Patient unable to engage in assessment without frequent loss of consciousness, as she was  continuously verbally prompted to response to questions asked. Patient does share does  share that she has been noncompliant with her psychiatric Medications. She states "I told the doctor I didn't feel right on them." Patient states that on today she called the police  because I just feel like a waste of space." She admits todaily heroin use although she also states that she's currently active in substance abuse treatment at [facility]. Pt. denies any suicidal  intent. Pt. denies the presence of any auditory or visual hallucinations at this time. Patient denies any other medical complaints.   Pt lying in bed, anxious, admits intent to not live anymore, hopeless, helpless. Reports stressors of her children "taken away" due to her substance use.  , endorses helplessness, hopelessness, Pt lives with her mother. Pt reports she goes to methadone clinic in [place], taking  methadone. Admits susing heroin  And xanax yesterday. Denies AVH. Reports anxiety, shaking and body ache,.   Past Psychiatric History: Patient has been seen several times before by mental health but usually in the context of opiate abuse and withdrawal.  Last admission at [facility] December 27 through July 11, 2018.    Patient denies ever having tried to kill herself in the past.  Previous suicidal ideation . Marland Kitchen Tx with prozac, seroquel, methadone maintenance  Risk to Self: Suicidal Ideation: Yes-Currently Present Suicidal Intent: No-Not Currently/Within Last 6 Months Is patient at risk for suicide?: Yes Suicidal Plan?: No-Not Currently/Within Last 6 Months Access to Means: Yes Specify Access to Suicidal Means: OD What has been your use of drugs/alcohol within the last 12 months?: Heroin, THC How many times?: 1 Other Self Harm Risks: SA Triggers for Past Attempts: None known Intentional Self Injurious Behavior: NoneYes  Risk to Others: Homicidal Ideation: No Thoughts  of Harm to Others: No Current Homicidal Intent: No Current Homicidal Plan: No Access to Homicidal Means: No Identified Victim:  None History of harm to others?: No Assessment of Violence: None Noted Does patient have access to weapons?: No Criminal Charges Pending?: No Does patient have a court date: NoDenies Prior Inpatient Therapy: Prior Inpatient Therapy: Yes Prior Therapy Dates: 06/2018 Prior Therapy Facilty/Provider(s): [facility] Reason for Treatment: DepressionYes, last at [facility] December 27 through July 11, 2018 Prior Outpatient Therapy: Prior Outpatient Therapy: Yes Prior Therapy Dates: Current Prior Therapy Facilty/Provider(s): [facility] Reason for Treatment: Methadone Treatment Does patient have an ACCT team?: No Does patient have Intensive In-House Services?  : No Does patient have [facility] services? : No Does patient have P4CC services?: NoQuestionable compliance with outpatient psychiatry, patient states she is in compliance with methadone clinic.  Past Medical History:  Past Medical History:  Diagnosis Date  . Anemia   . Cocaine use disorder (HCC)   . Dyspnea    with pregnancy  . Mental disorder   . Opioid use disorder, moderate, dependence (HCC)   . Substance induced mood disorder [facility])     Past Surgical History:  Procedure Laterality Date  . CESAREAN SECTION N/A 02/09/2016   Procedure: CESAREAN SECTION;  Surgeon: Nadara Mustard, MD;  Location: [facility] ORS;  Service: Obstetrics;  Laterality: N/A;  . CESAREAN SECTION N/A 04/20/2017   Procedure: CESAREAN SECTION;  Surgeon: Vena Austria, MD;  Location: [facility] ORS;  Service: Obstetrics;  Laterality: N/A;   Family History:  Family History  Problem Relation Age of Onset  . Drug abuse Mother    Family Psychiatric  History: Positive for substance abuse  Social History:  Social History   Substance and Sexual Activity  Alcohol Use Yes     Social History   Substance and Sexual Activity  Drug Use Yes  . Frequency: 2.0 times per week  . Types: Cocaine, Marijuana, Heroin   Comment: heroin    Social History   Socioeconomic History   . Marital status: Single    Spouse name: Not on file  . Number of children: Not on file  . Years of education: Not on file  . Highest education level: Not on file  Occupational History  . Not on file  Social Needs  . Financial resource strain: Not on file  . Food insecurity:    Worry: Not on file    Inability: Not on file  . Transportation needs:    Medical: Not on file    Non-medical: Not on file  Tobacco Use  . Smoking status: Current Every Day Smoker    Packs/day: 0.50    Types: Cigarettes  . Smokeless tobacco: Current User  Substance and Sexual Activity  . Alcohol use: Yes  . Drug use: Yes    Frequency: 2.0 times per week    Types: Cocaine, Marijuana, Heroin    Comment: heroin  . Sexual activity: Yes  Lifestyle  . Physical activity:    Days per week: Not on file    Minutes per session: Not on file  . Stress: Not on file  Relationships  . Social connections:    Talks on phone: Not on file    Gets together: Not on file    Attends religious service: Not on file    Active member of club or organization: Not on file    Attends meetings of clubs or organizations: Not on file    Relationship status: Not on file  Other  Topics Concern  . Not on file  Social History Narrative  . Not on file   Additional Social History:  Patient is living with her mother.  She has a couple of children living but neither of them are in her custody.    Substance abuse history: History of opiate abuse now attending the methadone clinic and says that since taking her methadone she is not getting back on the narcotics although she does take Xanax off the street and smokes marijuana.  Denies alcohol abuse.     Allergies:   Allergies  Allergen Reactions  . Naproxen Hives    Labs:  Results for orders placed or performed during the hospital encounter of 09/02/18 (from the past 48 hour(s))  Acetaminophen level     Status: Abnormal   Collection Time: 09/02/18  5:48 PM  Result Value Ref  Range   Acetaminophen (Tylenol), Serum <10 (L) 10 - 30 ug/mL    Comment: (NOTE) Therapeutic concentrations vary significantly. A range of 10-30 ug/mL  may be an effective concentration for many patients. However, some  are best treated at concentrations outside of this range. Acetaminophen concentrations >150 ug/mL at 4 hours after ingestion  and >50 ug/mL at 12 hours after ingestion are often associated with  toxic reactions. Performed at [facility], [address] [place], [place] [address]   Ethanol     Status: None   Collection Time: 09/02/18  5:48 PM  Result Value Ref Range   Alcohol, Ethyl (B) <10 <10 mg/dL    Comment: (NOTE) Lowest detectable limit for serum alcohol is 10 mg/dL. For medical purposes only. Performed at [facility], [address] [place], [place] [address]   Salicylate level     Status: None   Collection Time: 09/02/18  5:48 PM  Result Value Ref Range   Salicylate Lvl <7.0 2.8 - 30.0 mg/dL    Comment: Performed at [facility], [address] [place], [place] [address]  CBC with Differential     Status: None   Collection Time: 09/02/18  5:48 PM  Result Value Ref Range   WBC 8.9 4.0 - 10.5 K/uL   RBC 4.32 3.87 - 5.11 MIL/uL   Hemoglobin 12.1 12.0 - 15.0 g/dL   HCT 87.6 81.1 - 57.2 %   MCV 87.5 80.0 - 100.0 fL   MCH 28.0 26.0 - 34.0 pg   MCHC 32.0 30.0 - 36.0 g/dL   RDW 62.0 35.5 - 97.4 %   Platelets 251 150 - 400 K/uL   nRBC 0.0 0.0 - 0.2 %   Neutrophils Relative % 73 %   Neutro Abs 6.5 1.7 - 7.7 K/uL   Lymphocytes Relative 21 %   Lymphs Abs 1.9 0.7 - 4.0 K/uL   Monocytes Relative 6 %   Monocytes Absolute 0.5 0.1 - 1.0 K/uL   Eosinophils Relative 0 %   Eosinophils Absolute 0.0 0.0 - 0.5 K/uL   Basophils Relative 0 %   Basophils Absolute 0.0 0.0 - 0.1 K/uL   Immature Granulocytes 0 %   Abs Immature Granulocytes 0.03 0.00 - 0.07 K/uL    Comment: Performed at [facility], [address]  [place], [place] [address]  Comprehensive metabolic panel     Status: Abnormal   Collection Time: 09/02/18  8:08 PM  Result Value Ref Range   Sodium 136 135 - 145 mmol/L   Potassium 3.2 (L) 3.5 - 5.1 mmol/L   Chloride 101 98 - 111 mmol/L  CO2 28 22 - 32 mmol/L   Glucose, Bld 107 (H) 70 - 99 mg/dL   BUN 15 6 - 20 mg/dL   Creatinine, Ser 1.61 0.44 - 1.00 mg/dL   Calcium 8.5 (L) 8.9 - 10.3 mg/dL   Total Protein 7.6 6.5 - 8.1 g/dL   Albumin 4.4 3.5 - 5.0 g/dL   AST 41 15 - 41 U/L   ALT 43 0 - 44 U/L   Alkaline Phosphatase 54 38 - 126 U/L   Total Bilirubin 0.5 0.3 - 1.2 mg/dL   GFR calc non Af Amer >60 >60 mL/min   GFR calc Af Amer >60 >60 mL/min   Anion gap 7 5 - 15    Comment: Performed at [facility], [address] [place], [place] [address]    No current facility-administered medications for this encounter.    Current Outpatient Medications  Medication Sig Dispense Refill  . FLUoxetine (PROZAC) 20 MG capsule Take 1 capsule (20 mg total) by mouth daily. 30 capsule 3  . methadone (DOLOPHINE) 10 MG tablet Take 80 mg by mouth daily.    . QUEtiapine (SEROQUEL) 300 MG tablet Take 1 tablet (300 mg total) by mouth at bedtime. 30 tablet 1  . traZODone (DESYREL) 100 MG tablet Take 1 tablet (100 mg total) by mouth at bedtime as needed for sleep. 30 tablet 1    Musculoskeletal: Strength & Muscle Tone: within normal limits Gait & Station: normal Patient leans: N/A  Psychiatric Specialty Exam: Physical Exam  Nursing note and vitals reviewed. Constitutional: She appears well-developed and well-nourished.  HENT:  Head: Normocephalic and atraumatic.  Eyes: Pupils are equal, round, and reactive to light. Conjunctivae are normal.  Neck: Normal range of motion.  Cardiovascular: Regular rhythm and normal heart sounds.  Respiratory: No respiratory distress.  GI: Soft.  Musculoskeletal: Normal range of motion.  Neurological: She is alert.  Skin: Skin is warm and dry.   Psychiatric: Her affect is angry. She is agitated. Thought content is not paranoid. She expresses impulsivity. She does not exhibit a depressed mood. She expresses no homicidal and no suicidal ideation.    Review of Systems  Constitutional: Negative.   HENT: Negative.   Eyes: Negative.   Respiratory: Negative.   Cardiovascular: Negative.   Gastrointestinal: Negative.   Musculoskeletal: Negative.   Skin: Negative.   Neurological: Negative.   Psychiatric/Behavioral: Positive for substance abuse ( ). Negative for depression, hallucinations, memory loss and suicidal ideas. The patient has insomnia. The patient is not nervous/anxious.     Blood pressure 102/66, pulse 98, temperature 98.1 F (36.7 C), temperature source Oral, resp. rate 18, height  (1.448 m), weight 54 kg, last menstrual period 08/22/2018, SpO2 98 %, unknown if currently breastfeeding.Body mass index is 25.76 kg/m.  General Appearance: Disheveled  Eye Contact:  Fair  Speech:  Clear and Coherent  Volume:  Increased  Mood:  Depressed, anxious  Affect:  Congruent  Thought Process:  Coherent  Orientation:  Full (Time, Place, and Person)  Thought Content:  Hopeless, helpless  Suicidal Thoughts:  Yes, no plan  Homicidal Thoughts:  No  Memory:  Immediate;   Fair Recent;   Poor Remote;   Fair  Judgement:  Fair  Insight:  Fair  Psychomotor Activity:  Restlessness  Concentration:  Concentration: Fair  Recall:  Poor  Fund of Knowledge:  Fair  Language:  Fair  Akathisia:  No  Handed:  Right  AIMS (if indicated):     Assets:  Housing Physical Health Social Support  ADL's:  Intact  Cognition:  Impaired,  Mild  Sleep:   Adequate overnight     Treatment Plan Summary: Tara Raymond is a 89[address][age] Raymond y.o. female with an extensive history of polysubstance abuse and depression, sustenance use disorder.   She presented to the emergency department after an  Overdose on heroin,     Plan- psych hospitalization for  safety.  Cont methadone maintenance , pt states she is taking 100mg  daily and last dose on 2/20, will order 60 mg and verify dose. Cont current meds- prozac, seroquel.  IVC status.    Beverly SessionsJagannath Kenyada Dosch, MD 09/03/2018 9:46 AM

## 2018-09-03 NOTE — ED Notes (Signed)
Pt. Woke up requesting pain medication for sore back, pt. Also requested sleep aid.  MD informed, orders issued.

## 2018-09-03 NOTE — Tx Team (Signed)
Initial Treatment Plan 09/03/2018 10:59 AM Lajoyce Corners Rosalva Ferron FXO:[id number]    PATIENT STRESSORS: Financial difficulties Health problems Occupational concerns Substance abuse   PATIENT STRENGTHS: Ability for insight Communication skills   PATIENT IDENTIFIED PROBLEMS: Suicidal Ideations  Ineffective Coping Skills  Unstable Mood                 DISCHARGE CRITERIA:  Ability to meet basic life and health needs Adequate post-discharge living arrangements Verbal commitment to aftercare and medication compliance  PRELIMINARY DISCHARGE PLAN: Attend aftercare/continuing care group Attend 12-step recovery group Outpatient therapy  PATIENT/FAMILY INVOLVEMENT: This treatment plan has been presented to and reviewed with the patient, Arayia Chhabra.  The patient and family have been given the opportunity to ask questions and make suggestions.  Berkley Harvey, RN 09/03/2018, 10:59 AM

## 2018-09-03 NOTE — Progress Notes (Signed)
Patient visibly agitated and anxious at this time. Pt. Also expressing that she is irritable, agitated, and anxious. Pt. Offered scheduled medication and PRN anxiety medication that she verbalizes will help. Pt. Given medications with no complications. Pt. Complaint with administration. Will continue to monitor.

## 2018-09-03 NOTE — Progress Notes (Signed)
D: Received patient from [facility] ED. Patient skin assessment completed with Aurther Loft RN, skin is intact, no contraband found. Patient is not a fall risk.  Patient reports passive SI without a plan. Patient verbalizes safety agreement.   A: Patient oriented to unit/room/call light. Patient was offered support and encouragement. Patient was encourage to attend groups, participate in unit activities and continue with plan of care. Q x 15 minute observation checks were completed for safety.   R: Patient has no complaints at this time. Patient is receptive to treatment and safety maintained on unit.

## 2018-09-03 NOTE — ED Notes (Signed)
Breakfast tray placed in patient room.  Hand hygiene encouraged.

## 2018-09-03 NOTE — ED Notes (Signed)
Pt urine sample tubed to lab.

## 2018-09-03 NOTE — Plan of Care (Signed)
D: Patient sitting in dayroom. Drowsy. Reporting pain 10/10 in her head and asking for sleep medication even though patient appears to be dozing off. Reports chronic passive SI but verbally contracts for safety. Calm and cooperative. A: Continue to monitor for safety.  R: Safety maintained 

## 2018-09-03 NOTE — BH Assessment (Signed)
Patient is to be admitted to [facility] by Dr. Joseph Art.  Attending Physician will be Dr. Toni Amend.   Patient has been assigned to room 304, by [facility] Charge Nurse Lorenda Hatchet.   ER staff is aware of the admission:  [facility] ER Secretary    Dr. Lenard Lance, ER MD   Prudencio Burly Patient's Nurse   Vikki Ports Patient Access.

## 2018-09-03 NOTE — ED Provider Notes (Signed)
-----------------------------------------   6:28 AM on 09/03/2018 -----------------------------------------   Blood pressure 102/66, pulse 98, temperature 98.1 F (36.7 C), temperature source Oral, resp. rate 18, height 4\' 9"  (1.448 m), weight 54 kg, last menstrual period 08/22/2018, SpO2 98 %, unknown if currently breastfeeding.  The patient is sleeping.  There have been no acute events since the last update.  Awaiting disposition plan from Behavioral Medicine team.   Irean Hong, MD 09/03/18 [phone]
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
## 2018-09-03 NOTE — Progress Notes (Signed)
Received nursing hand off report for patient from Nurse Rhea. Patient's chart reviewed. Patient has admission orders sign and held. Patient is appropriate for unit and is pending transfer from ED to BMU.

## 2018-09-04 DIAGNOSIS — F1994 Other psychoactive substance use, unspecified with psychoactive substance-induced mood disorder: Secondary | ICD-10-CM

## 2018-09-04 DIAGNOSIS — F1120 Opioid dependence, uncomplicated: Secondary | ICD-10-CM

## 2018-09-04 LAB — RAPID HIV SCREEN (HIV 1/2 AB+AG)
HIV 1/2 Antibodies: NONREACTIVE
HIV-1 P24 ANTIGEN - HIV24: NONREACTIVE

## 2018-09-04 MED ORDER — CEPHALEXIN 500 MG PO CAPS
500.0000 mg | ORAL_CAPSULE | Freq: Two times a day (BID) | ORAL | Status: DC
  Administered 2018-09-04 – 2018-09-09 (×11): 500 mg via ORAL
  Filled 2018-09-04 (×11): qty 1

## 2018-09-04 NOTE — Plan of Care (Signed)
D: Patient sitting in the dayroom watching TV. Quiet, calm and cooperative. Mood is sad. Affect is flat. Medication compliant. Voices no complaints. Denies SI, HI, and AV hallucinations. Verbally contracting for safety. A: Continue to monitor for safety and offer support. R: Safety maintained.

## 2018-09-04 NOTE — BHH Suicide Risk Assessment (Signed)
[facility] Admission Suicide Risk Assessment   Nursing information obtained from:  Patient, Review of record Demographic factors:  Low socioeconomic status, Unemployed, Living alone, Adolescent or young adult Current Mental Status:  Self-harm thoughts Loss Factors:  Financial problems / change in socioeconomic status Historical Factors:  Prior suicide attempts, Impulsivity Risk Reduction Factors:  NA  Total Time spent with patient: 1 hour Principal Problem: Substance induced mood disorder (HCC) Diagnosis:  Principal Problem:   Substance induced mood disorder (HCC) Active Problems:   Opioid use disorder, severe, dependence (HCC)  Subjective Data: see HPI  Continued Clinical Symptoms:  Alcohol Use Disorder Identification Test Final Score (AUDIT): 19 The "Alcohol Use Disorders Identification Test", Guidelines for Use in Primary Care, Second Edition.  World Science writer [facility]). Score between 0-7:  no or low risk or alcohol related problems. Score between 8-15:  moderate risk of alcohol related problems. Score between 16-19:  high risk of alcohol related problems. Score 20 or above:  warrants further diagnostic evaluation for alcohol dependence and treatment.   CLINICAL FACTORS:   Depression:   Anhedonia   Musculoskeletal: See HPI  Psychiatric Specialty Exam:  See HPI   COGNITIVE FEATURES THAT CONTRIBUTE TO RISK:  Closed-mindedness    SUICIDE RISK:   Mild:  Suicidal ideation of limited frequency, intensity, duration, and specificity.  There are no identifiable plans, no associated intent, mild dysphoria and related symptoms, good self-control (both objective and subjective assessment), few other risk factors, and identifiable protective factors, including available and accessible social support.  PLAN OF CARE: see HPI  I certify that inpatient services furnished can reasonably be expected to improve the patient's condition.   Tiffancy Moger, MD 09/04/2018, 11:35 AM

## 2018-09-04 NOTE — Progress Notes (Signed)
D: Patient denies SI, HI and AV hallucinations. Requested and received prn medication for sleep with good results. Interacting appropriately with staff and peers. Continues to be disheveled. A: Continue to monitor for safety and offer support. R: Safety maintained.

## 2018-09-04 NOTE — Progress Notes (Signed)
This writer has received this patient assignment at this time. Will continue care starting at this time.  

## 2018-09-04 NOTE — BHH Group Notes (Addendum)
LCSW Group Therapy Note 09/04/2018 1:15pm  Type of Therapy and Topic: Group Therapy: Feelings Around Returning Home & Establishing a Supportive Framework and Supporting Oneself When Supports Not Available  Participation Level: Active  Description of Group:  Patients first processed thoughts and feelings about upcoming discharge. These included fears of upcoming changes, lack of change, new living environments, judgements and expectations from others and overall stigma of mental health issues. The group then discussed the definition of a supportive framework, what that looks and feels like, and how do to discern it from an unhealthy non-supportive network. The group identified different types of supports as well as what to do when your family/friends are less than helpful or unavailable  Therapeutic Goals  1. Patient will identify one healthy supportive network that they can use at discharge. 2. Patient will identify one factor of a supportive framework and how to tell it from an unhealthy network. 3. Patient able to identify one coping skill to use when they do not have positive supports from others. 4. Patient will demonstrate ability to communicate their needs through discussion and/or role plays.  Summary of Patient Progress:  The patient reported that she feels "okay." Pt engaged during group session. As patients processed their anxiety about discharge and described healthy supports patient shared she is not sure if she is ready to be discharge. She stated, "I am not myself yet."  Patients identified at least one self-care tool they were willing to use after discharge.   Therapeutic Modalities Cognitive Behavioral Therapy Motivational Interviewing   Johnnye Sima, LCSW 09/04/2018 12:19 PM

## 2018-09-04 NOTE — Plan of Care (Signed)
Pt. Denies si/hi/avh, can contract for safety. Pt. Monitored for safety.    Problem: Self-Concept: Goal: Ability to disclose and discuss suicidal ideas will improve Outcome: Progressing

## 2018-09-04 NOTE — Progress Notes (Signed)
D- Patient alert and oriented. Patient presents in a pleasant mood on assessment stating that she slept ok last night, but had complaints of anxiety and constipation. Patient did request medication from this Clinical research associate. Patient rated her pain a "10/10" in her back, stating that "it's all over". Patient rated her anxiety a "10/10" stating "everything" is making her feel this way, "I hope this works out for me". Patient denies SI, HI, AVH, and depression at this time. Patient had no stated goals for today.  A- Scheduled medications administered to patient, per MD orders. Support and encouragement provided.  Routine safety checks conducted every 15 minutes.  Patient informed to notify staff with problems or concerns.  R- No adverse drug reactions noted. Patient contracts for safety at this time. Patient compliant with medications and treatment plan. Patient receptive, calm, and cooperative. Patient interacts well with others on the unit.  Patient remains safe at this time.

## 2018-09-04 NOTE — Progress Notes (Addendum)
Patient instructed on how to obtain urine sample requested by the provider. Pt. Verbalizes understanding and reports she will comply when able to. Lab reports they need an additional sample.

## 2018-09-04 NOTE — H&P (Addendum)
Psychiatric Admission Assessment Adult  Patient Identification: Tara Raymond MRN:  [id number] Date of Evaluation:  09/04/2018 Chief Complaint:  depression Principal Diagnosis: Substance induced mood disorder (HCC) Diagnosis:  Principal Problem:   Substance induced mood disorder (HCC) Active Problems:   Opioid use disorder, severe, dependence (HCC)  History of Present Illness: 27yo AAF with opioid use disorder, severe and SIMD, presented to ED after she overdose on heroine, and later admitted that was a suicidal attempt.  She was found unresponsive with agonal breathing by her mom, and responded to Narcan by EMS.  Of note, pt has had multiple admissions in the past with similar presentation.   Pt is seen today on the unit.  She reports feeling better and stated that she doesn't want to die anymore. She said that she has been going to an [facility], [facility] for methadone and was able to stay off heroin for a while, but she missed her dose of methadone for 3 days, which trigger her relapse on heroine.  She said that she feels very "guilty" about the relapse, thus, did the overdose intentionally.    She admitted feeling depressed, but is not on any antidepressant.  She admitted that she has not been following with her outpatient psychiatrist and therapist.  She did not see her counselor in OTP either.   Per records, Last hospital admission:  07/09/2019 -07/12/2019 with recommended changes: #Mood, improved, psychosis resolved -Prozac 20 mg daily -Seroquelto  nightly -Trazodone 100 mg nightlyPRN  Currently, she feels comfortable with methadone  daily and no withdrawal from BDZ.   Associated Signs/Symptoms: Depression Symptoms:  depressed mood, (Hypo) Manic Symptoms:  not at this time Anxiety Symptoms:  Social Anxiety, Psychotic Symptoms:  denied PTSD Symptoms: Negative Total Time spent with patient: 45 minutes  Past Psychiatric History: Patient has been  seen several times before by mental health but usually in the context of opiate abuse and withdrawal.  Last admission at [facility] December 27 through July 11, 2018.    Patient denies ever having tried to kill herself in the past.  Previous suicidal ideation . Marland Kitchen Tx with prozac, seroquel, methadone maintenance  Is the patient at risk to self? Yes.    Has the patient been a risk to self in the past 6 months? Yes.    Has the patient been a risk to self within the distant past? Yes.    Is the patient a risk to others? No.  Has the patient been a risk to others in the past 6 months? No.  Has the patient been a risk to others within the distant past? No.   Prior Inpatient Therapy:   Prior Outpatient Therapy:    Alcohol Screening: 1. How often do you have a drink containing alcohol?: 4 or more times a week 2. How many drinks containing alcohol do you have on a typical day when you are drinking?: 3 or 4 3. How often do you have six or more drinks on one occasion?: Monthly AUDIT-C Score: 7 4. How often during the last year have you found that you were not able to stop drinking once you had started?: Monthly 5. How often during the last year have you failed to do what was normally expected from you becasue of drinking?: Monthly 6. How often during the last year have you needed a first drink in the morning to get yourself going after a heavy drinking session?: Never 7. How often during the last year have you had  a feeling of guilt of remorse after drinking?: Monthly 8. How often during the last year have you been unable to remember what happened the night before because you had been drinking?: Monthly 9. Have you or someone else been injured as a result of your drinking?: No 10. Has a relative or friend or a doctor or another health worker been concerned about your drinking or suggested you cut down?: Yes, during the last year Alcohol Use Disorder Identification Test Final Score (AUDIT): 19 Alcohol Brief  Interventions/Follow-up: Continued Monitoring, Medication Offered/Prescribed Substance Abuse History in the last 12 months:  Yes.   Consequences of Substance Abuse: Family Consequences:  mom found her unresponsive Previous Psychotropic Medications: Yes  Psychological Evaluations: Yes  Past Medical History:  Past Medical History:  Diagnosis Date  . Anemia   . Cocaine use disorder (HCC)   . Dyspnea    with pregnancy  . Mental disorder   . Opioid use disorder, moderate, dependence (HCC)   . Substance induced mood disorder [facility])     Past Surgical History:  Procedure Laterality Date  . CESAREAN SECTION N/A 02/09/2016   Procedure: CESAREAN SECTION;  Surgeon: Nadara Mustard, MD;  Location: [facility] ORS;  Service: Obstetrics;  Laterality: N/A;  . CESAREAN SECTION N/A 04/20/2017   Procedure: CESAREAN SECTION;  Surgeon: Vena Austria, MD;  Location: [facility] ORS;  Service: Obstetrics;  Laterality: N/A;   Family History:  Family History  Problem Relation Age of Onset  . Drug abuse Mother    Family Psychiatric  History:  Tobacco Screening: Have you used any form of tobacco in the last 30 days? (Cigarettes, Smokeless Tobacco, Cigars, and/or Pipes): Yes Tobacco use, Select all that apply: 5 or more cigarettes per day Are you interested in Tobacco Cessation Medications?: Yes, will notify MD for an order Counseled patient on smoking cessation including recognizing danger situations, developing coping skills and basic information about quitting provided: Yes Social History:  Social History   Substance and Sexual Activity  Alcohol Use Yes     Social History   Substance and Sexual Activity  Drug Use Yes  . Frequency: 2.0 times per week  . Types: Cocaine, Marijuana, Heroin   Comment: heroin    Additional Social History: Patient is living with her mother.  She has a couple of children living but neither of them are in her custody.  Substance abuse history: History of opiate abuse now  attending the methadone clinic and says that since taking her methadone she is not getting back on the narcotics although she does take Xanax off the street and smokes marijuana.  Denies alcohol abuse.  Allergies:   Allergies  Allergen Reactions  . Naproxen Hives   Lab Results:  Results for orders placed or performed during the hospital encounter of 09/02/18 (from the past 48 hour(s))  Acetaminophen level     Status: Abnormal   Collection Time: 09/02/18  5:48 PM  Result Value Ref Range   Acetaminophen (Tylenol), Serum <10 (L) 10 - 30 ug/mL    Comment: (NOTE) Therapeutic concentrations vary significantly. A range of 10-30 ug/mL  may be an effective concentration for many patients. However, some  are best treated at concentrations outside of this range. Acetaminophen concentrations >150 ug/mL at 4 hours after ingestion  and >50 ug/mL at 12 hours after ingestion are often associated with  toxic reactions. Performed at [facility], [address], [place], [place] [address]   Ethanol     Status: None  Collection Time: 09/02/18  5:48 PM  Result Value Ref Range   Alcohol, Ethyl (B) <10 <10 mg/dL    Comment: (NOTE) Lowest detectable limit for serum alcohol is 10 mg/dL. For medical purposes only. Performed at [facility], [address] [place], [place] [address]   Salicylate level     Status: None   Collection Time: 09/02/18  5:48 PM  Result Value Ref Range   Salicylate Lvl <7.0 2.8 - 30.0 mg/dL    Comment: Performed at [facility], [address] [place], [place] [address]  CBC with Differential     Status: None   Collection Time: 09/02/18  5:48 PM  Result Value Ref Range   WBC 8.9 4.0 - 10.5 K/uL   RBC 4.32 3.87 - 5.11 MIL/uL   Hemoglobin 12.1 12.0 - 15.0 g/dL   HCT 09.8 11.9 - 14.7 %   MCV 87.5 80.0 - 100.0 fL   MCH 28.0 26.0 - 34.0 pg   MCHC 32.0 30.0 - 36.0 g/dL   RDW 82.9 56.2 - 13.0 %   Platelets 251 150 - 400 K/uL   nRBC 0.0 0.0 -  0.2 %   Neutrophils Relative % 73 %   Neutro Abs 6.5 1.7 - 7.7 K/uL   Lymphocytes Relative 21 %   Lymphs Abs 1.9 0.7 - 4.0 K/uL   Monocytes Relative 6 %   Monocytes Absolute 0.5 0.1 - 1.0 K/uL   Eosinophils Relative 0 %   Eosinophils Absolute 0.0 0.0 - 0.5 K/uL   Basophils Relative 0 %   Basophils Absolute 0.0 0.0 - 0.1 K/uL   Immature Granulocytes 0 %   Abs Immature Granulocytes 0.03 0.00 - 0.07 K/uL    Comment: Performed at [facility], [address] [place], [place] [address]  Pregnancy, urine     Status: None   Collection Time: 09/02/18  8:07 PM  Result Value Ref Range   Preg Test, Ur NEGATIVE NEGATIVE    Comment: Performed at [facility], [address] [place], [place] [address]  Urinalysis, Complete w Microscopic     Status: Abnormal   Collection Time: 09/02/18  8:07 PM  Result Value Ref Range   Color, Urine YELLOW (A) YELLOW   APPearance CLOUDY (A) CLEAR   Specific Gravity, Urine 1.026 1.005 - 1.030   pH 6.0 5.0 - 8.0   Glucose, UA NEGATIVE NEGATIVE mg/dL   Hgb urine dipstick NEGATIVE NEGATIVE   Bilirubin Urine NEGATIVE NEGATIVE   Ketones, ur 20 (A) NEGATIVE mg/dL   Protein, ur 30 (A) NEGATIVE mg/dL   Nitrite POSITIVE (A) NEGATIVE   Leukocytes,Ua MODERATE (A) NEGATIVE   RBC / HPF 6-10 0 - 5 RBC/hpf   WBC, UA >50 (H) 0 - 5 WBC/hpf   Bacteria, UA MANY (A) NONE SEEN   Squamous Epithelial / LPF 6-10 0 - 5   WBC Clumps PRESENT    Mucus PRESENT     Comment: Performed at [facility], [address], [place], [place] [address]  Urine Drug Screen, Qualitative     Status: Abnormal   Collection Time: 09/02/18  8:07 PM  Result Value Ref Range   Tricyclic, Ur Screen NONE DETECTED NONE DETECTED   Amphetamines, Ur Screen NONE DETECTED NONE DETECTED   MDMA (Ecstasy)Ur Screen NONE DETECTED NONE DETECTED   Cocaine Metabolite,Ur [place] NONE DETECTED NONE DETECTED   Opiate, Ur Screen POSITIVE (A) NONE DETECTED   Phencyclidine (PCP) Ur S NONE  DETECTED NONE DETECTED  Cannabinoid 50 Ng, Ur [place] POSITIVE (A) NONE DETECTED   Barbiturates, Ur Screen NONE DETECTED NONE DETECTED   Benzodiazepine, Ur Scrn POSITIVE (A) NONE DETECTED   Methadone Scn, Ur NONE DETECTED NONE DETECTED    Comment: (NOTE) Tricyclics + metabolites, urine    Cutoff 1000 ng/mL Amphetamines + metabolites, urine  Cutoff 1000 ng/mL MDMA (Ecstasy), urine              Cutoff 500 ng/mL Cocaine Metabolite, urine          Cutoff 300 ng/mL Opiate + metabolites, urine        Cutoff 300 ng/mL Phencyclidine (PCP), urine         Cutoff 25 ng/mL Cannabinoid, urine                 Cutoff 50 ng/mL Barbiturates + metabolites, urine  Cutoff 200 ng/mL Benzodiazepine, urine              Cutoff 200 ng/mL Methadone, urine                   Cutoff 300 ng/mL The urine drug screen provides only a preliminary, unconfirmed analytical test result and should not be used for non-medical purposes. Clinical consideration and professional judgment should be applied to any positive drug screen result due to possible interfering substances. A more specific alternate chemical method must be used in order to obtain a confirmed analytical result. Gas chromatography / mass spectrometry (GC/MS) is the preferred confirmat ory method. Performed at [facility], [address] [place], [place] [address]   Comprehensive metabolic panel     Status: Abnormal   Collection Time: 09/02/18  8:08 PM  Result Value Ref Range   Sodium 136 135 - 145 mmol/L   Potassium 3.2 (L) 3.5 - 5.1 mmol/L   Chloride 101 98 - 111 mmol/L   CO2 28 22 - 32 mmol/L   Glucose, Bld 107 (H) 70 - 99 mg/dL   BUN 15 6 - 20 mg/dL   Creatinine, Ser 6.04 0.44 - 1.00 mg/dL   Calcium 8.5 (L) 8.9 - 10.3 mg/dL   Total Protein 7.6 6.5 - 8.1 g/dL   Albumin 4.4 3.5 - 5.0 g/dL   AST 41 15 - 41 U/L   ALT 43 0 - 44 U/L   Alkaline Phosphatase 54 38 - 126 U/L   Total Bilirubin 0.5 0.3 - 1.2 mg/dL   GFR calc non Af Amer >60 >60  mL/min   GFR calc Af Amer >60 >60 mL/min   Anion gap 7 5 - 15    Comment: Performed at [facility], [address] [place], [place] [address]    Blood Alcohol level:  Lab Results  Component Value Date   [facility] <10 09/02/2018   ETH <10 08/31/2018    Metabolic Disorder Labs:  Lab Results  Component Value Date   HGBA1C 4.8 07/09/2018   MPG 91.06 07/09/2018   No results found for: PROLACTIN Lab Results  Component Value Date   CHOL 215 (H) 07/09/2018   TRIG 130 07/09/2018   HDL 41 07/09/2018   CHOLHDL 5.2 07/09/2018   VLDL 26 07/09/2018   LDLCALC 148 (H) 07/09/2018    Current Medications: Current Facility-Administered Medications  Medication Dose Route Frequency Provider Last Rate Last Dose  . acetaminophen (TYLENOL) tablet 650 mg  650 mg Oral Q6H PRN Beverly Sessions, MD   650 mg at 09/03/18 1934  . alum & mag hydroxide-simeth (MAALOX/MYLANTA) 200-200-20 MG/5ML  suspension 30 mL  30 mL Oral Q4H PRN Beverly SessionsSubedi, Jagannath, MD      . hydrOXYzine (ATARAX/VISTARIL) tablet 50 mg  50 mg Oral Q6H PRN Beverly SessionsSubedi, Jagannath, MD   50 mg at 09/04/18 0846  . magnesium hydroxide (MILK OF MAGNESIA) suspension 30 mL  30 mL Oral Daily PRN Beverly SessionsSubedi, Jagannath, MD   30 mL at 09/04/18 0846  . methadone (DOLOPHINE) tablet 60 mg  60 mg Oral Daily Beverly SessionsSubedi, Jagannath, MD   60 mg at 09/04/18 0845  . nicotine (NICODERM CQ - dosed in mg/24 hours) patch 14 mg  14 mg Transdermal Daily Clapacs, Jackquline DenmarkJohn T, MD   14 mg at 09/04/18 0851  . traZODone (DESYREL) tablet 100 mg  100 mg Oral QHS PRN Beverly SessionsSubedi, Jagannath, MD   100 mg at 09/03/18 2101   PTA Medications: Medications Prior to Admission  Medication Sig Dispense Refill Last Dose  . FLUoxetine (PROZAC) 20 MG capsule Take 1 capsule (20 mg total) by mouth daily. 30 capsule 3   . methadone (DOLOPHINE) 10 MG tablet Take 80 mg by mouth daily.     . QUEtiapine (SEROQUEL) 300 MG tablet Take 1 tablet (300 mg total) by mouth at bedtime. 30 tablet 1   . traZODone  (DESYREL) 100 MG tablet Take 1 tablet (100 mg total) by mouth at bedtime as needed for sleep. 30 tablet 1     Musculoskeletal: Strength & Muscle Tone: within normal limits Gait & Station: normal Patient leans: N/A  Psychiatric Specialty Exam: Physical Exam  ROS  Blood pressure 112/79, pulse 65, temperature 98.6 F (37 C), temperature source Oral, resp. rate 16, height 5' 1.42" (1.56 m), weight 51.3 kg, last menstrual period 08/22/2018, SpO2 100 %, unknown if currently breastfeeding.Body mass index is 21.06 kg/m.  General Appearance: Disheveled  Eye Contact:  Minimal  Speech:  Normal Rate  Volume:  Decreased  Mood:  Depressed  Affect:  Blunt  Thought Process:  Goal Directed  Orientation:  Full (Time, Place, and Person)  Thought Content:  Logical  Suicidal Thoughts:  No  Homicidal Thoughts:  No  Memory:  Immediate;   Good Recent;   Fair Remote;   Fair  Judgement:  Fair  Insight:  Fair  Psychomotor Activity:  Normal  Concentration:  Concentration: Fair and Attention Span: Fair  Recall:  FiservFair  Fund of Knowledge:  Fair  Language:  Fair  Akathisia:  Negative  Handed:    AIMS (if indicated):     Assets:  Physical Health  ADL's:  Intact  Cognition:  WNL  Sleep:       Treatment Plan Summary: Daily contact with patient to assess and evaluate symptoms and progress in treatment and Medication management   Plan:   # Opioid use disorder, severe -- continue Methadone 60mg  daily ( pt claimed that her last dose was 100mg  on 2/20, however, need to verify with OTP).  -- continue to recommend OTP followuup.   # SIMD, substance induced mood disorder. -- restart Prozac 20mg  daily -- restart seroquel 100mg  qhs.  -- provide trazdone 100mg  qhs prn.   # Benzo use disorder -- currently, no withdrawal Sx.  -- AVOID BDZ given that she is on methadone.   # UTI -- start Cephalexin 500mg  BID for 7 days  # STD workup --  Bacterial Testing from Urine sample for gonorrhea and chlamydia  is ordered, adjust the antibiotic accordingly, when results are available.  --  Hep B and C and HIV testing were ordered.  Please check the results.   # Labs -- CBC and Chem 7 are [facility].  -- pregnancy test: negative.   # Dispo -- defer to primary team.    Observation Level/Precautions:  15 minute checks  Laboratory:  CBC Chemistry Profile Folic Acid  Psychotherapy:    Medications:    Consultations:    Discharge Concerns:    Estimated LOS:  Other:     Physician Treatment Plan for Primary Diagnosis: <principal problem not specified> Long Term Goal(s): Improvement in symptoms so as ready for discharge  Short Term Goals: Ability to identify changes in lifestyle to reduce recurrence of condition will improve, Ability to verbalize feelings will improve, Ability to disclose and discuss suicidal ideas, Ability to demonstrate self-control will improve, Ability to identify and develop effective coping behaviors will improve, Ability to maintain clinical measurements within normal limits will improve, Compliance with prescribed medications will improve and Ability to identify triggers associated with substance abuse/mental health issues will improve  Physician Treatment Plan for Secondary Diagnosis: Active Problems:   Substance induced mood disorder (HCC)  Long Term Goal(s): Improvement in symptoms so as ready for discharge  Short Term Goals: Ability to identify changes in lifestyle to reduce recurrence of condition will improve, Ability to verbalize feelings will improve, Ability to disclose and discuss suicidal ideas, Ability to demonstrate self-control will improve, Ability to identify and develop effective coping behaviors will improve, Ability to maintain clinical measurements within normal limits will improve, Compliance with prescribed medications will improve and Ability to identify triggers associated with substance abuse/mental health issues will improve  I certify that inpatient  services furnished can reasonably be expected to improve the patient's condition.    Jovonte Commins, MD 2/23/202010:45 AM

## 2018-09-05 DIAGNOSIS — N390 Urinary tract infection, site not specified: Secondary | ICD-10-CM

## 2018-09-05 DIAGNOSIS — F322 Major depressive disorder, single episode, severe without psychotic features: Principal | ICD-10-CM

## 2018-09-05 DIAGNOSIS — F1310 Sedative, hypnotic or anxiolytic abuse, uncomplicated: Secondary | ICD-10-CM

## 2018-09-05 MED ORDER — TRAZODONE HCL 100 MG PO TABS
100.0000 mg | ORAL_TABLET | Freq: Once | ORAL | Status: AC
  Administered 2018-09-06: 100 mg via ORAL
  Filled 2018-09-05: qty 1

## 2018-09-05 MED ORDER — ONDANSETRON HCL 4 MG PO TABS
4.0000 mg | ORAL_TABLET | Freq: Three times a day (TID) | ORAL | Status: DC | PRN
  Filled 2018-09-05: qty 1

## 2018-09-05 MED ORDER — METHOCARBAMOL 750 MG PO TABS
750.0000 mg | ORAL_TABLET | Freq: Three times a day (TID) | ORAL | Status: DC | PRN
  Administered 2018-09-05: 750 mg via ORAL
  Filled 2018-09-05: qty 1

## 2018-09-05 MED ORDER — CHLORDIAZEPOXIDE HCL 25 MG PO CAPS
25.0000 mg | ORAL_CAPSULE | Freq: Three times a day (TID) | ORAL | Status: DC
  Administered 2018-09-05 – 2018-09-06 (×3): 25 mg via ORAL
  Filled 2018-09-05 (×3): qty 1

## 2018-09-05 NOTE — Progress Notes (Signed)
CSW sent referrals for [facility], [facility], and [facility].  Iris Pert, MSW, LCSW Clinical Social Work 09/05/2018 12:19 PM

## 2018-09-05 NOTE — Plan of Care (Signed)
Patient is alert and oriented X 3, denies SI, HI and AVH. Patient very tearful this morning complaining of depression 8/10, hopelessness  8/10 and anxiety. Patient complains of trouble sleeping at night. Patient is disheveled with poor hygiene, runny nose and irritability, slight tremors. Safety checks Q 15 minutes.

## 2018-09-05 NOTE — [facility] Group Notes (Signed)
Overcoming Obstacles  09/05/2018 1PM  Type of Therapy and Topic:  Group Therapy:  Overcoming Obstacles  Participation Level:  Active    Description of Group:    In this group patients will be encouraged to explore what they see as obstacles to their own wellness and recovery. They will be guided to discuss their thoughts, feelings, and behaviors related to these obstacles. The group will process together ways to cope with barriers, with attention given to specific choices patients can make. Each patient will be challenged to identify changes they are motivated to make in order to overcome their obstacles. This group will be process-oriented, with patients participating in exploration of their own experiences as well as giving and receiving support and challenge from other group members.   Therapeutic Goals: 1. Patient will identify personal and current obstacles as they relate to admission. 2. Patient will identify barriers that currently interfere with their wellness or overcoming obstacles.  3. Patient will identify feelings, thought process and behaviors related to these barriers. 4. Patient will identify two changes they are willing to make to overcome these obstacles:      Summary of Patient Progress  Actively and appropriately engaged in the group. Patient was able to provide support and validation to other group members.Patient practiced active listening when interacting with the facilitator and other group members. Patient demonstrated good insight and discussed the constant changes that have occurred in her life that have led to obstacles she is trying to overcome.     Therapeutic Modalities:   Cognitive Behavioral Therapy Solution Focused Therapy Motivational Interviewing Relapse Prevention Therapy    Lowella Dandy, MSW, LCSW 09/05/2018 2:37 PM

## 2018-09-05 NOTE — Progress Notes (Signed)
[facility] MD Progress Note  09/05/2018 12:38 PM Tara Raymond  MRN:  [id number] Subjective: Follow-up for this patient with a history of substance abuse and depression.  Came to the hospital after being found unconscious overdosed on heroin.  Patient tells me today she thinks that she was having thoughts about wanting to die although she cannot remember what her specific intent was.  She says she has been taking a lot of Xanax and a lot of heroin recently.  Patient complains of physical discomfort.  Says she is feeling sick to her stomach today.  Still feels hopeless and down although has no suicidal intent here in the hospital.  Complains of aches and pains all over.  Overall perhaps mild withdrawal symptoms although she is on methadone which was continued in the hospital. Principal Problem: Severe major depression, single episode, without psychotic features (HCC) Diagnosis: Principal Problem:   Severe major depression, single episode, without psychotic features (HCC) Active Problems:   Substance induced mood disorder (HCC)   Opioid use disorder, severe, dependence (HCC)   Benzodiazepine abuse (HCC)   Urinary tract infection  Total Time spent with patient: 30 minutes  Past Psychiatric History: Past history of substance abuse treatment although limited experience with sobriety.  Prior suicidality and mood instability mostly linked to substance use  Past Medical History:  Past Medical History:  Diagnosis Date  . Anemia   . Cocaine use disorder (HCC)   . Dyspnea    with pregnancy  . Mental disorder   . Opioid use disorder, moderate, dependence (HCC)   . Substance induced mood disorder [facility])     Past Surgical History:  Procedure Laterality Date  . CESAREAN SECTION N/A 02/09/2016   Procedure: CESAREAN SECTION;  Surgeon: Nadara Mustard, MD;  Location: [facility] ORS;  Service: Obstetrics;  Laterality: N/A;  . CESAREAN SECTION N/A 04/20/2017   Procedure: CESAREAN SECTION;  Surgeon: Vena Austria, MD;  Location: [facility] ORS;  Service: Obstetrics;  Laterality: N/A;   Family History:  Family History  Problem Relation Age of Onset  . Drug abuse Mother    Family Psychiatric  History: None reported Social History:  Social History   Substance and Sexual Activity  Alcohol Use Yes     Social History   Substance and Sexual Activity  Drug Use Yes  . Frequency: 2.0 times per week  . Types: Cocaine, Marijuana, Heroin   Comment: heroin    Social History   Socioeconomic History  . Marital status: Single    Spouse name: Not on file  . Number of children: Not on file  . Years of education: Not on file  . Highest education level: Not on file  Occupational History  . Not on file  Social Needs  . Financial resource strain: Not on file  . Food insecurity:    Worry: Not on file    Inability: Not on file  . Transportation needs:    Medical: Not on file    Non-medical: Not on file  Tobacco Use  . Smoking status: Current Every Day Smoker    Packs/day: 0.50    Types: Cigarettes  . Smokeless tobacco: Current User  Substance and Sexual Activity  . Alcohol use: Yes  . Drug use: Yes    Frequency: 2.0 times per week    Types: Cocaine, Marijuana, Heroin    Comment: heroin  . Sexual activity: Yes  Lifestyle  . Physical activity:    Days per week: Not on file  Minutes per session: Not on file  . Stress: Not on file  Relationships  . Social connections:    Talks on phone: Not on file    Gets together: Not on file    Attends religious service: Not on file    Active member of club or organization: Not on file    Attends meetings of clubs or organizations: Not on file    Relationship status: Not on file  Other Topics Concern  . Not on file  Social History Narrative  . Not on file   Additional Social History:                         Sleep: Fair  Appetite:  Fair  Current Medications: Current Facility-Administered Medications  Medication Dose Route  Frequency Provider Last Rate Last Dose  . acetaminophen (TYLENOL) tablet 650 mg  650 mg Oral Q6H PRN Beverly Sessions, MD   650 mg at 09/04/18 1158  . alum & mag hydroxide-simeth (MAALOX/MYLANTA) 200-200-20 MG/5ML suspension 30 mL  30 mL Oral Q4H PRN Beverly Sessions, MD      . cephALEXin (KEFLEX) capsule 500 mg  500 mg Oral Q12H He, Jun, MD   500 mg at 09/05/18 0808  . chlordiazePOXIDE (LIBRIUM) capsule 25 mg  25 mg Oral TID ,  T, MD   25 mg at 09/05/18 1212  . hydrOXYzine (ATARAX/VISTARIL) tablet 50 mg  50 mg Oral Q6H PRN Beverly Sessions, MD   50 mg at 09/05/18 0810  . magnesium hydroxide (MILK OF MAGNESIA) suspension 30 mL  30 mL Oral Daily PRN Beverly Sessions, MD   30 mL at 09/04/18 0846  . methadone (DOLOPHINE) tablet 60 mg  60 mg Oral Daily Beverly Sessions, MD   60 mg at 09/05/18 0807  . methocarbamol (ROBAXIN) tablet 750 mg  750 mg Oral Q8H PRN ,  T, MD      . nicotine (NICODERM CQ - dosed in mg/24 hours) patch 14 mg  14 mg Transdermal Daily , Jackquline Denmark, MD   14 mg at 09/05/18 0806  . ondansetron (ZOFRAN) tablet 4 mg  4 mg Oral Q8H PRN , Jackquline Denmark, MD      . traZODone (DESYREL) tablet 100 mg  100 mg Oral QHS PRN Beverly Sessions, MD   100 mg at 09/04/18 2110    Lab Results:  Results for orders placed or performed during the hospital encounter of 09/03/18 (from the past 48 hour(s))  Rapid HIV screen [facility] L&D dept ONLY)     Status: None   Collection Time: 09/04/18 11:57 AM  Result Value Ref Range   HIV-1 P24 Antigen - HIV24 NON REACTIVE NON REACTIVE   HIV 1/2 Antibodies NON REACTIVE NON REACTIVE   Interpretation (HIV Ag Ab)      A non reactive test result means that HIV 1 or HIV 2 antibodies and HIV 1 p24 antigen were not detected in the specimen.    Comment: Performed at [facility], [address] [place], [place] [address]    Blood Alcohol level:  Lab Results  Component Value Date   [facility] <10 09/02/2018   ETH <10 08/31/2018     Metabolic Disorder Labs: Lab Results  Component Value Date   HGBA1C 4.8 07/09/2018   MPG 91.06 07/09/2018   No results found for: PROLACTIN Lab Results  Component Value Date   CHOL 215 (H) 07/09/2018   TRIG 130 07/09/2018   HDL 41 07/09/2018  CHOLHDL 5.2 07/09/2018   VLDL 26 07/09/2018   LDLCALC 148 (H) 07/09/2018    Physical Findings: AIMS:  , ,  ,  ,    CIWA:    COWS:     Musculoskeletal: Strength & Muscle Tone: within normal limits Gait & Station: normal Patient leans: N/A  Psychiatric Specialty Exam: Physical Exam  Nursing note and vitals reviewed. Constitutional: She appears well-developed and well-nourished.  HENT:  Head: Normocephalic and atraumatic.  Eyes: Pupils are equal, round, and reactive to light. Conjunctivae are normal.  Neck: Normal range of motion.  Cardiovascular: Regular rhythm and normal heart sounds.  Respiratory: Effort normal.  GI: Soft.  Musculoskeletal: Normal range of motion.  Neurological: She is alert.  Skin: Skin is warm and dry.  Psychiatric: Her mood appears anxious. Her speech is delayed. She is slowed. Thought content is not paranoid. Cognition and memory are impaired. She expresses impulsivity. She exhibits a depressed mood. She expresses no homicidal and no suicidal ideation.    Review of Systems  Constitutional: Negative.   HENT: Negative.   Eyes: Negative.   Respiratory: Negative.   Cardiovascular: Negative.   Gastrointestinal: Negative.   Musculoskeletal: Negative.   Skin: Negative.   Neurological: Negative.   Psychiatric/Behavioral: Positive for depression, memory loss, substance abuse and suicidal ideas. Negative for hallucinations. The patient is nervous/anxious and has insomnia.     Blood pressure 120/88, pulse 67, temperature 98.6 F (37 C), temperature source Oral, resp. rate 16, height 5' 1.42" (1.56 m), weight 51.3 kg, last menstrual period 08/22/2018, SpO2 100 %, unknown if currently breastfeeding.Body mass  index is 21.06 kg/m.  General Appearance: Casual  Eye Contact:  Fair  Speech:  Clear and Coherent  Volume:  Normal  Mood:  Euthymic  Affect:  Congruent  Thought Process:  Goal Directed  Orientation:  Full (Time, Place, and Person)  Thought Content:  Logical  Suicidal Thoughts:  Yes.  without intent/plan  Homicidal Thoughts:  No  Memory:  Immediate;   Fair Recent;   Fair Remote;   Fair  Judgement:  Fair  Insight:  Fair  Psychomotor Activity:  Decreased  Concentration:  Concentration: Fair  Recall:  [employer] of Knowledge:  Fair  Language:  Fair  Akathisia:  No  Handed:  Right  AIMS (if indicated):     Assets:  Desire for Improvement Housing  ADL's:  Intact  Cognition:  WNL  Sleep:        Treatment Plan Summary: Daily contact with patient to assess and evaluate symptoms and progress in treatment, Medication management and Plan Adding some medicine for symptomatic detox including Zofran and Robaxin.  Also adding some Librium for a couple days for the Xanax withdrawal.  Supportive therapy and review of treatment options.  Suggest that we consider referral to the alcohol and drug abuse treatment center.  Patient is tentatively agreeable to the plan for now.  No change to any psychiatric medicine yet while she is still feeling physically sick.  Encourage group attendance  Mordecai Rasmussen, MD 09/05/2018, 12:38 PM

## 2018-09-05 NOTE — [facility] Suicide Risk Assessment (Signed)
[facility] INPATIENT:  Family/Significant Other Suicide Prevention Education  Suicide Prevention Education:  Patient Refusal for Family/Significant Other Suicide Prevention Education: The patient Tara Raymond has refused to provide written consent for family/significant other to be provided Family/Significant Other Suicide Prevention Education during admission and/or prior to discharge.  Physician notified.  SPE completed with pt, as pt refused to consent to family contact. SPI pamphlet provided to pt and pt was encouraged to share information with support network, ask questions, and talk about any concerns relating to SPE. Pt denies access to guns/firearms and verbalized understanding of information provided. Mobile Crisis information also provided to pt.    Charlann Lange Tamyra Fojtik MSW LCSW 09/05/2018, 11:41 AM

## 2018-09-05 NOTE — Tx Team (Addendum)
Interdisciplinary Treatment and Diagnostic Plan Update  09/05/2018 Time of Session: 200PM Tara GuilesOctavia Nicole Raymond MRN: [id number]  Principal Diagnosis: Severe major depression, single episode, without psychotic features [facility](HCC)  Secondary Diagnoses: Principal Problem:   Severe major depression, single episode, without psychotic features (HCC) Active Problems:   Substance induced mood disorder (HCC)   Opioid use disorder, severe, dependence (HCC)   Benzodiazepine abuse (HCC)   Urinary tract infection   Current Medications:  Current Facility-Administered Medications  Medication Dose Route Frequency Provider Last Rate Last Dose  . acetaminophen (TYLENOL) tablet 650 mg  650 mg Oral Q6H PRN Beverly SessionsSubedi, Jagannath, MD   650 mg at 09/04/18 1158  . alum & mag hydroxide-simeth (MAALOX/MYLANTA) 200-200-20 MG/5ML suspension 30 mL  30 mL Oral Q4H PRN Beverly SessionsSubedi, Jagannath, MD      . cephALEXin (KEFLEX) capsule 500 mg  500 mg Oral Q12H He, Jun, MD   500 mg at 09/05/18 0808  . chlordiazePOXIDE (LIBRIUM) capsule 25 mg  25 mg Oral TID Clapacs, John T, MD   25 mg at 09/05/18 1212  . hydrOXYzine (ATARAX/VISTARIL) tablet 50 mg  50 mg Oral Q6H PRN Beverly SessionsSubedi, Jagannath, MD   50 mg at 09/05/18 0810  . magnesium hydroxide (MILK OF MAGNESIA) suspension 30 mL  30 mL Oral Daily PRN Beverly SessionsSubedi, Jagannath, MD   30 mL at 09/04/18 0846  . methadone (DOLOPHINE) tablet 60 mg  60 mg Oral Daily Beverly SessionsSubedi, Jagannath, MD   60 mg at 09/05/18 0807  . methocarbamol (ROBAXIN) tablet 750 mg  750 mg Oral Q8H PRN Clapacs, John T, MD      . nicotine (NICODERM CQ - dosed in mg/24 hours) patch 14 mg  14 mg Transdermal Daily Clapacs, Jackquline DenmarkJohn T, MD   14 mg at 09/05/18 0806  . ondansetron (ZOFRAN) tablet 4 mg  4 mg Oral Q8H PRN Clapacs, John T, MD      . traZODone (DESYREL) tablet 100 mg  100 mg Oral QHS PRN Beverly SessionsSubedi, Jagannath, MD   100 mg at 09/04/18 2110   PTA Medications: Medications Prior to Admission  Medication Sig Dispense Refill Last Dose  .  FLUoxetine (PROZAC) 20 MG capsule Take 1 capsule (20 mg total) by mouth daily. 30 capsule 3   . methadone (DOLOPHINE) 10 MG tablet Take 80 mg by mouth daily.     . QUEtiapine (SEROQUEL) 300 MG tablet Take 1 tablet (300 mg total) by mouth at bedtime. 30 tablet 1   . traZODone (DESYREL) 100 MG tablet Take 1 tablet (100 mg total) by mouth at bedtime as needed for sleep. 30 tablet 1     Patient Stressors: Financial difficulties Health problems Occupational concerns Substance abuse  Patient Strengths: Ability for insight Communication skills  Treatment Modalities: Medication Management, Group therapy, Case management,  1 to 1 session with clinician, Psychoeducation, Recreational therapy.   Physician Treatment Plan for Primary Diagnosis: Severe major depression, single episode, without psychotic features (HCC) Long Term Goal(s): Improvement in symptoms so as ready for discharge Improvement in symptoms so as ready for discharge   Short Term Goals: Ability to identify changes in lifestyle to reduce recurrence of condition will improve Ability to verbalize feelings will improve Ability to disclose and discuss suicidal ideas Ability to demonstrate self-control will improve Ability to identify and develop effective coping behaviors will improve Ability to maintain clinical measurements within normal limits will improve Compliance with prescribed medications will improve Ability to identify triggers associated with substance abuse/mental health issues will improve Ability to identify  changes in lifestyle to reduce recurrence of condition will improve Ability to verbalize feelings will improve Ability to disclose and discuss suicidal ideas Ability to demonstrate self-control will improve Ability to identify and develop effective coping behaviors will improve Ability to maintain clinical measurements within normal limits will improve Compliance with prescribed medications will improve Ability  to identify triggers associated with substance abuse/mental health issues will improve  Medication Management: Evaluate patient's response, side effects, and tolerance of medication regimen.  Therapeutic Interventions: 1 to 1 sessions, Unit Group sessions and Medication administration.  Evaluation of Outcomes: Progressing  Physician Treatment Plan for Secondary Diagnosis: Principal Problem:   Severe major depression, single episode, without psychotic features (HCC) Active Problems:   Substance induced mood disorder (HCC)   Opioid use disorder, severe, dependence (HCC)   Benzodiazepine abuse (HCC)   Urinary tract infection  Long Term Goal(s): Improvement in symptoms so as ready for discharge Improvement in symptoms so as ready for discharge   Short Term Goals: Ability to identify changes in lifestyle to reduce recurrence of condition will improve Ability to verbalize feelings will improve Ability to disclose and discuss suicidal ideas Ability to demonstrate self-control will improve Ability to identify and develop effective coping behaviors will improve Ability to maintain clinical measurements within normal limits will improve Compliance with prescribed medications will improve Ability to identify triggers associated with substance abuse/mental health issues will improve Ability to identify changes in lifestyle to reduce recurrence of condition will improve Ability to verbalize feelings will improve Ability to disclose and discuss suicidal ideas Ability to demonstrate self-control will improve Ability to identify and develop effective coping behaviors will improve Ability to maintain clinical measurements within normal limits will improve Compliance with prescribed medications will improve Ability to identify triggers associated with substance abuse/mental health issues will improve     Medication Management: Evaluate patient's response, side effects, and tolerance of medication  regimen.  Therapeutic Interventions: 1 to 1 sessions, Unit Group sessions and Medication administration.  Evaluation of Outcomes: Progressing   RN Treatment Plan for Primary Diagnosis: Severe major depression, single episode, without psychotic features (HCC) Long Term Goal(s): Knowledge of disease and therapeutic regimen to maintain health will improve  Short Term Goals: Ability to demonstrate self-control, Ability to participate in decision making will improve and Ability to identify and develop effective coping behaviors will improve  Medication Management: RN will administer medications as ordered by provider, will assess and evaluate patient's response and provide education to patient for prescribed medication. RN will report any adverse and/or side effects to prescribing provider.  Therapeutic Interventions: 1 on 1 counseling sessions, Psychoeducation, Medication administration, Evaluate responses to treatment, Monitor vital signs and CBGs as ordered, Perform/monitor CIWA, COWS, AIMS and Fall Risk screenings as ordered, Perform wound care treatments as ordered.  Evaluation of Outcomes: Progressing   LCSW Treatment Plan for Primary Diagnosis: Severe major depression, single episode, without psychotic features (HCC) Long Term Goal(s): Safe transition to appropriate next level of care at discharge, Engage patient in therapeutic group addressing interpersonal concerns.  Short Term Goals: Engage patient in aftercare planning with referrals and resources, Increase emotional regulation, Facilitate acceptance of mental health diagnosis and concerns, Facilitate patient progression through stages of change regarding substance use diagnoses and concerns, Identify triggers associated with mental health/substance abuse issues and Increase skills for wellness and recovery  Therapeutic Interventions: Assess for all discharge needs, 1 to 1 time with Social worker, Explore available resources and support  systems, Assess for adequacy in community  support network, Educate family and significant other(s) on suicide prevention, Complete Psychosocial Assessment, Interpersonal group therapy.  Evaluation of Outcomes: Progressing   Progress in Treatment: Attending groups: Yes. Participating in groups: Yes. Taking medication as prescribed: Yes. Toleration medication: Yes. Family/Significant other contact made: No, will contact:  SPE completed with pt as pt declined collateral contact Patient understands diagnosis: Yes. Discussing patient identified problems/goals with staff: Yes. Medical problems stabilized or resolved: Yes. Denies suicidal/homicidal ideation: Yes. Issues/concerns per patient self-inventory: No. Other: n/a  New problem(s) identified: No, Describe:  none  New Short Term/Long Term Goal(s): Detox, elimination of AVH/symptoms of psychosis, medication management for mood stabilization; elimination of SI thoughts; development of comprehensive mental wellness/sobriety plan.   Patient Goals:  "Sobriety and to get my panic and anxiety down"  Discharge Plan or Barriers: SPE pamphlet, Mobile Crisis information, and AA/NA information provided to patient for additional community support and resources.Referrals to [facility], [facility], and [facility] made. CSW assessing for appropriate referrals.   Reason for Continuation of Hospitalization: Anxiety Medication stabilization Withdrawal symptoms  Estimated Length of Stay: 5-7 days    Attendees: Patient: Tara Raymond 09/05/2018 2:15 PM  Physician: Dr Toni Amend MD 09/05/2018 2:15 PM  Nursing:  09/05/2018 2:15 PM  RN Care Manager: 09/05/2018 2:15 PM  Social Worker: Zollie Scale Moton LCSW 09/05/2018 2:15 PM  Recreational Therapist: Garret Reddish CTRS LRT 09/05/2018 2:15 PM  Other: Penni Homans LCSW 09/05/2018 2:15 PM  Other:  09/05/2018 2:15 PM  Other: 09/05/2018 2:15 PM    Scribe for Treatment Team: Charlann Lange Moton, LCSW 09/05/2018 2:15 PM

## 2018-09-05 NOTE — Progress Notes (Signed)
Recreation Therapy Notes  INPATIENT RECREATION THERAPY ASSESSMENT  Patient Details Name: Tara Raymond MRN: [id number] DOB: June 26, 1991 Today's Date: 09/05/2018       Information Obtained From: Patient  Able to Participate in Assessment/Interview: Yes  Patient Presentation: Responsive  Reason for Admission (Per Patient): Active Symptoms  Patient Stressors:    Coping Skills:   Film/video editor, Substance Abuse, Avoidance  Leisure Interests (2+):  Sports - Dance, Music - Singing  Frequency of Recreation/Participation: Monthly  Awareness of Community Resources:     [employer]:     Current Use:    If no, Barriers?:    Expressed Interest in [employer] Information:    [employer] of Residence:  Film/video editor  Patient Main Form of Transportation: Other (Comment)(my mom )  Patient Strengths:  my loyalty  Patient Identified Areas of Improvement:  Stop my drug use and stay sober and stay out of trouble  Patient Goal for Hospitalization:  Sobriety  Current SI (including self-harm):  No  Current HI:  No  Current AVH: No  Staff Intervention Plan: Group Attendance, Collaborate with Interdisciplinary Treatment Team  Consent to Intern Participation: N/A  Samhitha Rosen 09/05/2018, 2:35 PM

## 2018-09-05 NOTE — Progress Notes (Signed)
Recreation Therapy Notes   Date: 09/05/2018  Time: 9:30 am  Location: Craft Room  Behavioral response: Appropriate  Intervention Topic: Goals  Discussion/Intervention:  Group content on today was focused on goals. Patients described what goals are and how they define goals. Individuals expressed how they go about setting goals and reaching them. The group identified how important goals are and if they make short term goals to reach long term goals. Patients described how many goals they work on at a time and what affects them not reaching their goal. Individuals described how much time they put into planning and obtaining their goals. The group participated in the intervention "My Goal Board" and made personal goal boards to help them achieve their goal. Clinical Observations/Feedback:  Patient came to group and defined goals as accomplishments. She explained that she makes goals based off where she is in her life and where she want to be. Participant identified a goal of hers is to get off drugs. Patient expressed that negativity keeps her from reaching her goal. She was pulled from group by social work but later returned. Individual was social with peers and staff while participating in group. Tara Raymond LRT/CTRS         [name] 09/05/2018 11:27 AM

## 2018-09-06 LAB — URINE CULTURE: Culture: >=100000 — AB

## 2018-09-06 LAB — HEPATITIS B E ANTIGEN: Hep B E Ag: NEGATIVE

## 2018-09-06 MED ORDER — FLUOXETINE HCL 20 MG PO CAPS
20.0000 mg | ORAL_CAPSULE | Freq: Every day | ORAL | Status: DC
  Administered 2018-09-06 – 2018-09-09 (×4): 20 mg via ORAL
  Filled 2018-09-06 (×4): qty 1

## 2018-09-06 MED ORDER — LORAZEPAM 1 MG PO TABS
1.0000 mg | ORAL_TABLET | Freq: Three times a day (TID) | ORAL | Status: DC
  Administered 2018-09-06 – 2018-09-08 (×7): 1 mg via ORAL
  Filled 2018-09-06 (×7): qty 1

## 2018-09-06 MED ORDER — QUETIAPINE FUMARATE 200 MG PO TABS
300.0000 mg | ORAL_TABLET | Freq: Every day | ORAL | Status: DC
  Administered 2018-09-06 – 2018-09-08 (×3): 300 mg via ORAL
  Filled 2018-09-06 (×3): qty 1

## 2018-09-06 NOTE — Progress Notes (Signed)
D: Pt during assessments denies SI/HI/AVH, contracts for safety. Pt. Continues to endorse a highly anxious and depressed mood, but when observed patient is frequently bright in her affect and very sociable with peers around the unit.   A: Q x 15 minute observation checks to be completed for safety. Patient was provided with education. Patient was given/offered medications per orders. Patient  was encourage to attend groups, participate in unit activities and continue with plan of care. Pt. Chart and plans of care reviewed. Pt. Given support and encouragement.   R: Patient is complaint with medications and unit procedures this evening. Pt. Attends snack time, eating good. Pt. Denies pain or  UTI symptoms. Pt. Given multiple sleep-aids per MD orders due to insomnia this shift.              Precautionary checks every 15 minutes for safety to be maintained.

## 2018-09-06 NOTE — Plan of Care (Signed)
Patient is alert and oriented X 3, complains of anxiety and worrying about the health condition of her mother who is currently in the hospital as well with flu. Patient is very disheveled with  body odor. Patient can become labile and have crying spells intermittently. Patient is taking medications and attending some groups. Safety checks to continue Q 15 minutes. Problem: Self-Concept: Goal: Ability to disclose and discuss suicidal ideas will improve Outcome: Progressing Goal: Will verbalize positive feelings about self Outcome: Progressing   Problem: Education: Goal: Knowledge of [facility] Education information/materials will improve Outcome: Progressing Goal: Emotional status will improve Outcome: Progressing Goal: Mental status will improve Outcome: Progressing Goal: Verbalization of understanding the information provided will improve Outcome: Progressing

## 2018-09-06 NOTE — Progress Notes (Signed)
Patient at this time reporting not being able to sleep, despite PRN medication given. Call placed to MD for orders, repeat dose ordered (see MAR).

## 2018-09-06 NOTE — Plan of Care (Signed)
Pt. Denies si/hi/avh, can contract for safety. Pt. Continues to endorse a highly anxious and depressed mood.    Problem: Education: Goal: Emotional status will improve Outcome: Not Progressing Goal: Mental status will improve Outcome: Not Progressing   Problem: Self-Concept: Goal: Ability to disclose and discuss suicidal ideas will improve Outcome: Progressing

## 2018-09-06 NOTE — Progress Notes (Signed)
CSW spoke with Marcelline Deist from [facility] to inquire about the status of pts referral. She reported that they want pt to attend their opiate treatment program and for CSW to contact Tamico tomorrow 09/07/18 about the status of the referral at [phone] and fax: [phone].  Iris Pert, MSW, LCSW Clinical Social Work 09/06/2018 2:03 PM

## 2018-09-06 NOTE — [facility] Group Notes (Signed)
  LCSW Group Therapy Note  09/06/2018 1:33 PM   Type of Therapy/Topic:  Group Therapy:  Feelings about Diagnosis  Participation Level:  Active   Description of Group:   This group will allow patients to explore their thoughts and feelings about diagnoses they have received. Patients will be guided to explore their level of understanding and acceptance of these diagnoses. Facilitator will encourage patients to process their thoughts and feelings about the reactions of others to their diagnosis and will guide patients in identifying ways to discuss their diagnosis with significant others in their lives. This group will be process-oriented, with patients participating in exploration of their own experiences, giving and receiving support, and processing challenge from other group members.   Therapeutic Goals: 1. Patient will demonstrate understanding of diagnosis as evidenced by identifying two or more symptoms of the disorder 2. Patient will be able to express two feelings regarding the diagnosis 3. Patient will demonstrate their ability to communicate their needs through discussion and/or role play  Summary of Patient Progress:  Pt was appropriate and respectful in group. Pt reported that a diagnosis is the name a doctor gives to explain what is going on with people. Pt expressed a medical diagnosis having a more positive stigma than a mental health diagnosis. Pt reported that she gets a positive response from her family knowing her mental health diagnosis.   Therapeutic Modalities:   Cognitive Behavioral Therapy Brief Therapy Feelings Identification    Iris Pert, MSW, LCSW Clinical Social Work 09/06/2018 1:33 PM

## 2018-09-06 NOTE — Progress Notes (Signed)
Recreation Therapy Notes  Date: 09/06/2018  Time: 9:30 am   Location: Craft room   Behavioral response: N/A   Intervention Topic: Stress  Discussion/Intervention: Patient did not attend group.   Clinical Observations/Feedback:  Patient did not attend group.   Suha Schoenbeck LRT/CTRS        Alondra Sahni 09/06/2018 10:21 AM

## 2018-09-06 NOTE — Progress Notes (Signed)
[facility] MD Progress Note  09/06/2018 4:24 PM Tara Raymond  MRN:  [id number] Subjective: Follow-up patient with severe depression and substance abuse.  Patient continues to complain of being very depressed.  She is withdrawn and does not get out of bed very much.  Passive suicidal thoughts with no intent or plan.  No current hallucinations.  Feeling sick to her stomach and not eating very well.  Still requesting that we look into longer term substance abuse treatment.  Patient did not sleep well last night. Principal Problem: Severe major depression, single episode, without psychotic features (HCC) Diagnosis: Principal Problem:   Severe major depression, single episode, without psychotic features (HCC) Active Problems:   Substance induced mood disorder (HCC)   Opioid use disorder, severe, dependence (HCC)   Benzodiazepine abuse (HCC)   Urinary tract infection  Total Time spent with patient: 30 minutes  Past Psychiatric History: Past history of substance abuse and severe depression  Past Medical History:  Past Medical History:  Diagnosis Date  . Anemia   . Cocaine use disorder (HCC)   . Dyspnea    with pregnancy  . Mental disorder   . Opioid use disorder, moderate, dependence (HCC)   . Substance induced mood disorder [facility])     Past Surgical History:  Procedure Laterality Date  . CESAREAN SECTION N/A 02/09/2016   Procedure: CESAREAN SECTION;  Surgeon: Nadara Mustard, MD;  Location: [facility] ORS;  Service: Obstetrics;  Laterality: N/A;  . CESAREAN SECTION N/A 04/20/2017   Procedure: CESAREAN SECTION;  Surgeon: Vena Austria, MD;  Location: [facility] ORS;  Service: Obstetrics;  Laterality: N/A;   Family History:  Family History  Problem Relation Age of Onset  . Drug abuse Mother    Family Psychiatric  History: None Social History:  Social History   Substance and Sexual Activity  Alcohol Use Yes     Social History   Substance and Sexual Activity  Drug Use Yes  . Frequency:  2.0 times per week  . Types: Cocaine, Marijuana, Heroin   Comment: heroin    Social History   Socioeconomic History  . Marital status: Single    Spouse name: Not on file  . Number of children: Not on file  . Years of education: Not on file  . Highest education level: Not on file  Occupational History  . Not on file  Social Needs  . Financial resource strain: Not on file  . Food insecurity:    Worry: Not on file    Inability: Not on file  . Transportation needs:    Medical: Not on file    Non-medical: Not on file  Tobacco Use  . Smoking status: Current Every Day Smoker    Packs/day: 0.50    Types: Cigarettes  . Smokeless tobacco: Current User  Substance and Sexual Activity  . Alcohol use: Yes  . Drug use: Yes    Frequency: 2.0 times per week    Types: Cocaine, Marijuana, Heroin    Comment: heroin  . Sexual activity: Yes  Lifestyle  . Physical activity:    Days per week: Not on file    Minutes per session: Not on file  . Stress: Not on file  Relationships  . Social connections:    Talks on phone: Not on file    Gets together: Not on file    Attends religious service: Not on file    Active member of club or organization: Not on file    Attends meetings of  clubs or organizations: Not on file    Relationship status: Not on file  Other Topics Concern  . Not on file  Social History Narrative  . Not on file   Additional Social History:                         Sleep: Fair  Appetite:  Fair  Current Medications: Current Facility-Administered Medications  Medication Dose Route Frequency Provider Last Rate Last Dose  . acetaminophen (TYLENOL) tablet 650 mg  650 mg Oral Q6H PRN Beverly Sessions, MD   650 mg at 09/04/18 1158  . alum & mag hydroxide-simeth (MAALOX/MYLANTA) 200-200-20 MG/5ML suspension 30 mL  30 mL Oral Q4H PRN Beverly Sessions, MD      . cephALEXin (KEFLEX) capsule 500 mg  500 mg Oral Q12H He, Jun, MD   500 mg at 09/06/18 0834  .  FLUoxetine (PROZAC) capsule 20 mg  20 mg Oral Daily Arch Methot T, MD   20 mg at 09/06/18 1254  . hydrOXYzine (ATARAX/VISTARIL) tablet 50 mg  50 mg Oral Q6H PRN Beverly Sessions, MD   50 mg at 09/05/18 1850  . LORazepam (ATIVAN) tablet 1 mg  1 mg Oral TID Rakiyah Esch, Jackquline Denmark, MD   1 mg at 09/06/18 1254  . magnesium hydroxide (MILK OF MAGNESIA) suspension 30 mL  30 mL Oral Daily PRN Beverly Sessions, MD   30 mL at 09/06/18 0836  . methadone (DOLOPHINE) tablet 60 mg  60 mg Oral Daily Beverly Sessions, MD   60 mg at 09/06/18 4888  . methocarbamol (ROBAXIN) tablet 750 mg  750 mg Oral Q8H PRN Saverio Kader, Jackquline Denmark, MD   750 mg at 09/05/18 1654  . nicotine (NICODERM CQ - dosed in mg/24 hours) patch 14 mg  14 mg Transdermal Daily Jakeira Seeman, Jackquline Denmark, MD   14 mg at 09/06/18 9169  . ondansetron (ZOFRAN) tablet 4 mg  4 mg Oral Q8H PRN Latrease Kunde T, MD      . QUEtiapine (SEROQUEL) tablet 300 mg  300 mg Oral QHS Kyrstal Monterrosa T, MD      . traZODone (DESYREL) tablet 100 mg  100 mg Oral QHS PRN Beverly Sessions, MD   100 mg at 09/05/18 2154    Lab Results: No results found for this or any previous visit (from the past 48 hour(s)).  Blood Alcohol level:  Lab Results  Component Value Date   ETH <10 09/02/2018   ETH <10 08/31/2018    Metabolic Disorder Labs: Lab Results  Component Value Date   HGBA1C 4.8 07/09/2018   MPG 91.06 07/09/2018   No results found for: PROLACTIN Lab Results  Component Value Date   CHOL 215 (H) 07/09/2018   TRIG 130 07/09/2018   HDL 41 07/09/2018   CHOLHDL 5.2 07/09/2018   VLDL 26 07/09/2018   LDLCALC 148 (H) 07/09/2018    Physical Findings: AIMS:  , ,  ,  ,    CIWA:    COWS:     Musculoskeletal: Strength & Muscle Tone: within normal limits Gait & Station: normal Patient leans: N/A  Psychiatric Specialty Exam: Physical Exam  Nursing note and vitals reviewed. Constitutional: She appears well-developed and well-nourished.  HENT:  Head: Normocephalic and  atraumatic.  Eyes: Pupils are equal, round, and reactive to light. Conjunctivae are normal.  Neck: Normal range of motion.  Cardiovascular: Regular rhythm and normal heart sounds.  Respiratory: Effort normal. No respiratory distress.  GI: Soft.  Musculoskeletal:  Normal range of motion.  Neurological: She is alert.  Skin: Skin is warm and dry.  Psychiatric: Judgment normal. Her affect is blunt. Her speech is delayed. She is slowed. Cognition and memory are normal. She exhibits a depressed mood. She expresses suicidal ideation. She expresses no suicidal plans.    Review of Systems  Constitutional: Positive for malaise/fatigue.  HENT: Negative.   Eyes: Negative.   Respiratory: Negative.   Cardiovascular: Negative.   Gastrointestinal: Negative.   Musculoskeletal: Negative.   Skin: Negative.   Neurological: Positive for tremors.  Psychiatric/Behavioral: Positive for depression and suicidal ideas. Negative for substance abuse. The patient is nervous/anxious and has insomnia.     Blood pressure 118/87, pulse 74, temperature 98.5 F (36.9 C), temperature source Oral, resp. rate 18, height 5' 1.42" (1.56 m), weight 51.3 kg, last menstrual period 08/22/2018, SpO2 100 %, unknown if currently breastfeeding.Body mass index is 21.06 kg/m.  General Appearance: Disheveled  Eye Contact:  Minimal  Speech:  Slow  Volume:  Decreased  Mood:  Depressed and Dysphoric  Affect:  Depressed  Thought Process:  Coherent  Orientation:  Full (Time, Place, and Person)  Thought Content:  Logical  Suicidal Thoughts:  Yes.  without intent/plan  Homicidal Thoughts:  No  Memory:  Immediate;   Fair Recent;   Fair Remote;   Fair  Judgement:  Fair  Insight:  Fair  Psychomotor Activity:  Decreased  Concentration:  Concentration: Fair  Recall:  [employer] of Knowledge:  Fair  Language:  Fair  Akathisia:  No  Handed:  Right  AIMS (if indicated):     Assets:  Desire for Improvement Resilience  ADL's:   Impaired  Cognition:  Impaired,  Mild  Sleep:  Number of Hours: 6     Treatment Plan Summary: Daily contact with patient to assess and evaluate symptoms and progress in treatment, Medication management and Plan Restarting Seroquel 300 mg at night but she had been taking previously also Prozac.  Continue detox medication.  Encourage patient to get up and make sure she is well-hydrated and eating well.  Needs to be attending groups and interacting.  Treatment team still working on substance abuse referral  Mordecai Rasmussen, MD 09/06/2018, 4:24 PM

## 2018-09-07 MED ORDER — METHOCARBAMOL 500 MG PO TABS
500.0000 mg | ORAL_TABLET | Freq: Three times a day (TID) | ORAL | Status: DC
  Administered 2018-09-07 – 2018-09-08 (×3): 500 mg via ORAL
  Filled 2018-09-07 (×4): qty 1

## 2018-09-07 MED ORDER — METHADONE HCL 10 MG PO TABS: 30.0000 mg | ORAL_TABLET | Freq: Every day | ORAL | Status: DC

## 2018-09-07 MED ORDER — TUBERCULIN PPD 5 UNIT/0.1ML ID SOLN
5.0000 [IU] | Freq: Once | INTRADERMAL | Status: AC
  Administered 2018-09-07: 5 [IU] via INTRADERMAL
  Filled 2018-09-07: qty 0.1

## 2018-09-07 MED ORDER — METHADONE HCL 10 MG PO TABS
60.0000 mg | ORAL_TABLET | Freq: Every day | ORAL | Status: DC
  Administered 2018-09-08: 60 mg via ORAL
  Filled 2018-09-07: qty 6

## 2018-09-07 NOTE — Progress Notes (Signed)
TB skin test administered in L)FA. To be read in 48 hours.

## 2018-09-07 NOTE — Progress Notes (Signed)
CSW faxed over physicians statement to [facility] 09/07/2018.  Iris Pert, MSW, LCSW Clinical Social Work 09/07/2018 3:57 PM

## 2018-09-07 NOTE — Plan of Care (Signed)
Pt. Continues to express passive suicidal thoughts, but is able to contract for safety. Pt. Denies hi/avh.    Problem: Self-Concept: Goal: Ability to disclose and discuss suicidal ideas will improve Outcome: Not Progressing

## 2018-09-07 NOTE — Progress Notes (Addendum)
D:Pt during assessments endorses passive suicidal thoughts, but can contract for safety. Pt. Denies HI/AVH. Pt. Continues to endorse a highly anxious and depressed mood, but when observed patient continues to frequently be bright in her affect and very sociable with peers around the unit. Pt. Spoke with this Clinical research associate for several minutes in a good conversation about developing a plan for steps going forward to be successful. Pt. Appears receptive and engages actively during our conversations to try to develop ideas to be successful upon discharge.   A: Q x 15 minute observation checks to be completed for safety. Patient was provided with education.Patient was given/offered medications per orders. Patient was encourage to attend groups, participate in unit activities and continue with plan of care. Pt. Chart and plans of care reviewed. Pt. Given support and encouragement.  R:Patient is complaint with medications and unit procedures this evening. Pt. Attends snack time, eating good. Pt. Denies pain or  UTI symptoms.            Precautionary checks every 15 minutes for safety to be maintained.

## 2018-09-07 NOTE — Progress Notes (Signed)
Recreation Therapy Notes   Date: 09/07/2018  Time: 9:30 am   Location: Craft room   Behavioral response: N/A   Intervention Topic: Problem Solving   Discussion/Intervention: Patient did not attend group.   Clinical Observations/Feedback:  Patient did not attend group.   Hasheem Voland LRT/CTRS        Cortasia Screws 09/07/2018 11:42 AM

## 2018-09-07 NOTE — Progress Notes (Signed)
CSW spoke with Tamico at [facility] opiate treatment program, who reported they would need the following faxed over: H&P, recent progress note from doctor, UDS, EKG, and an outpatient appointment scheduled. CSW faxed over H&P, recent progress note from doctor, UDS, EKG to [phone] and spoke with Tamico again regarding the follow-up appointment date and next steps. Tamico reported they will review the information and contact CSW.  CSW spoke with [facility] who reported they will have a female bed next week and scheduled a phone interview with pt. [facility] reported they will need a TB test completed on pt and the physicians statement completed and faxed over to [phone], Dr. Toni Amend aware. Pt had phone intake with [facility] at 12:30PM. CSW will follow up with pt regarding the call.  Iris Pert, MSW, LCSW Clinical Social Work 09/07/2018 1:21 PM

## 2018-09-07 NOTE — Progress Notes (Signed)
[facility] MD Progress Note  09/07/2018 4:40 PM Tara Raymond  MRN:  [id number] Subjective: Follow-up patient with depression and substance abuse.  Patient seen chart reviewed.  Treatment team social worker is working on finding referrals to inpatient rehab.  However, when I spoke to the patient this evening she told me that she would want to go home prior to any admission to inpatient treatment.  I understand what she is saying but I think this is often a bad sign as to whether anyone is going to follow-up.  We also reviewed the fact that any inpatient facility would want her to be off of her methadone.  This clearly frightens her and she does not want me to change the methadone for now.  She is already complaining about back pain so I will add some Robaxin for her.  She does say that her mood is a little better.  Not suicidal.  Still looks very anxious though. Principal Problem: Severe major depression, single episode, without psychotic features (HCC) Diagnosis: Principal Problem:   Severe major depression, single episode, without psychotic features (HCC) Active Problems:   Substance induced mood disorder (HCC)   Opioid use disorder, severe, dependence (HCC)   Benzodiazepine abuse (HCC)   Urinary tract infection  Total Time spent with patient: 30 minutes  Past Psychiatric History: Patient has a history of depression and mood instability and substance abuse  Past Medical History:  Past Medical History:  Diagnosis Date  . Anemia   . Cocaine use disorder (HCC)   . Dyspnea    with pregnancy  . Mental disorder   . Opioid use disorder, moderate, dependence (HCC)   . Substance induced mood disorder [facility])     Past Surgical History:  Procedure Laterality Date  . CESAREAN SECTION N/A 02/09/2016   Procedure: CESAREAN SECTION;  Surgeon: Nadara Mustard, MD;  Location: [facility] ORS;  Service: Obstetrics;  Laterality: N/A;  . CESAREAN SECTION N/A 04/20/2017   Procedure: CESAREAN SECTION;  Surgeon:  Vena Austria, MD;  Location: [facility] ORS;  Service: Obstetrics;  Laterality: N/A;   Family History:  Family History  Problem Relation Age of Onset  . Drug abuse Mother    Family Psychiatric  History: See previous Social History:  Social History   Substance and Sexual Activity  Alcohol Use Yes     Social History   Substance and Sexual Activity  Drug Use Yes  . Frequency: 2.0 times per week  . Types: Cocaine, Marijuana, Heroin   Comment: heroin    Social History   Socioeconomic History  . Marital status: Single    Spouse name: Not on file  . Number of children: Not on file  . Years of education: Not on file  . Highest education level: Not on file  Occupational History  . Not on file  Social Needs  . Financial resource strain: Not on file  . Food insecurity:    Worry: Not on file    Inability: Not on file  . Transportation needs:    Medical: Not on file    Non-medical: Not on file  Tobacco Use  . Smoking status: Current Every Day Smoker    Packs/day: 0.50    Types: Cigarettes  . Smokeless tobacco: Current User  Substance and Sexual Activity  . Alcohol use: Yes  . Drug use: Yes    Frequency: 2.0 times per week    Types: Cocaine, Marijuana, Heroin    Comment: heroin  . Sexual activity: Yes  Lifestyle  . Physical activity:    Days per week: Not on file    Minutes per session: Not on file  . Stress: Not on file  Relationships  . Social connections:    Talks on phone: Not on file    Gets together: Not on file    Attends religious service: Not on file    Active member of club or organization: Not on file    Attends meetings of clubs or organizations: Not on file    Relationship status: Not on file  Other Topics Concern  . Not on file  Social History Narrative  . Not on file   Additional Social History:                         Sleep: Poor  Appetite:  Fair  Current Medications: Current Facility-Administered Medications  Medication Dose  Route Frequency Provider Last Rate Last Dose  . acetaminophen (TYLENOL) tablet 650 mg  650 mg Oral Q6H PRN Beverly SessionsSubedi, Jagannath, MD   650 mg at 09/04/18 1158  . alum & mag hydroxide-simeth (MAALOX/MYLANTA) 200-200-20 MG/5ML suspension 30 mL  30 mL Oral Q4H PRN Beverly SessionsSubedi, Jagannath, MD      . cephALEXin (KEFLEX) capsule 500 mg  500 mg Oral Q12H He, Jun, MD   500 mg at 09/07/18 0818  . FLUoxetine (PROZAC) capsule 20 mg  20 mg Oral Daily Clapacs, John T, MD   20 mg at 09/07/18 0819  . hydrOXYzine (ATARAX/VISTARIL) tablet 50 mg  50 mg Oral Q6H PRN Beverly SessionsSubedi, Jagannath, MD   50 mg at 09/07/18 1417  . LORazepam (ATIVAN) tablet 1 mg  1 mg Oral TID Clapacs, Jackquline DenmarkJohn T, MD   1 mg at 09/07/18 1122  . magnesium hydroxide (MILK OF MAGNESIA) suspension 30 mL  30 mL Oral Daily PRN Beverly SessionsSubedi, Jagannath, MD   30 mL at 09/06/18 0836  . [START ON 09/08/2018] methadone (DOLOPHINE) tablet 60 mg  60 mg Oral Daily Clapacs, John T, MD      . methocarbamol (ROBAXIN) tablet 500 mg  500 mg Oral TID Clapacs, John T, MD      . methocarbamol (ROBAXIN) tablet 750 mg  750 mg Oral Q8H PRN Clapacs, Jackquline DenmarkJohn T, MD   750 mg at 09/05/18 1654  . nicotine (NICODERM CQ - dosed in mg/24 hours) patch 14 mg  14 mg Transdermal Daily Clapacs, Jackquline DenmarkJohn T, MD   14 mg at 09/07/18 0818  . ondansetron (ZOFRAN) tablet 4 mg  4 mg Oral Q8H PRN Clapacs, John T, MD      . QUEtiapine (SEROQUEL) tablet 300 mg  300 mg Oral QHS Clapacs, John T, MD   300 mg at 09/06/18 2153  . traZODone (DESYREL) tablet 100 mg  100 mg Oral QHS PRN Beverly SessionsSubedi, Jagannath, MD   100 mg at 09/05/18 2154  . tuberculin injection 5 Units  5 Units Intradermal Once Clapacs, Jackquline DenmarkJohn T, MD   5 Units at 09/07/18 1412    Lab Results: No results found for this or any previous visit (from the past 48 hour(s)).  Blood Alcohol level:  Lab Results  Component Value Date   [facility]   ETH <10 08/31/2018    Metabolic Disorder Labs: Lab Results  Component Value Date   HGBA1C 4.8 07/09/2018   MPG 91.06  07/09/2018   No results found for: PROLACTIN Lab Results  Component Value Date   CHOL 215 (H) 07/09/2018   TRIG 130 07/09/2018  HDL 41 07/09/2018   CHOLHDL 5.2 07/09/2018   VLDL 26 07/09/2018   LDLCALC 148 (H) 07/09/2018    Physical Findings: AIMS:  , ,  ,  ,    CIWA:    COWS:     Musculoskeletal: Strength & Muscle Tone: within normal limits Gait & Station: normal Patient leans: N/A  Psychiatric Specialty Exam: Physical Exam  Nursing note and vitals reviewed. Constitutional: She appears well-developed and well-nourished.  HENT:  Head: Normocephalic and atraumatic.  Eyes: Pupils are equal, round, and reactive to light. Conjunctivae are normal.  Neck: Normal range of motion.  Cardiovascular: Normal heart sounds.  Respiratory: Effort normal.  GI: Soft.  Musculoskeletal: Normal range of motion.  Neurological: She is alert.  Skin: Skin is warm and dry.  Psychiatric: Judgment normal. Her mood appears anxious. Her affect is blunt. Her speech is delayed. She is slowed. Cognition and memory are normal. She expresses no homicidal and no suicidal ideation.    Review of Systems  Constitutional: Negative.   HENT: Negative.   Eyes: Negative.   Respiratory: Negative.   Cardiovascular: Negative.   Gastrointestinal: Negative.   Musculoskeletal: Positive for back pain.  Skin: Negative.   Neurological: Negative.   Psychiatric/Behavioral: Negative for depression, hallucinations, substance abuse and suicidal ideas. The patient is nervous/anxious.     Blood pressure 105/69, pulse 87, temperature (!) 97.3 F (36.3 C), temperature source Oral, resp. rate 16, height 5' 1.42" (1.56 m), weight 51.3 kg, last menstrual period 08/22/2018, SpO2 100 %, unknown if currently breastfeeding.Body mass index is 21.06 kg/m.  General Appearance: Casual  Eye Contact:  Fair  Speech:  Clear and Coherent  Volume:  Decreased  Mood:  Dysphoric  Affect:  Congruent  Thought Process:  Coherent   Orientation:  Full (Time, Place, and Person)  Thought Content:  Logical  Suicidal Thoughts:  No  Homicidal Thoughts:  No  Memory:  Immediate;   Fair Recent;   Fair Remote;   Fair  Judgement:  Fair  Insight:  Fair  Psychomotor Activity:  Decreased  Concentration:  Concentration: Fair  Recall:  [employer] of Knowledge:  Fair  Language:  Fair  Akathisia:  No  Handed:  Right  AIMS (if indicated):     Assets:  Desire for Improvement  ADL's:  Intact  Cognition:  WNL  Sleep:  Number of Hours: 5     Treatment Plan Summary: Daily contact with patient to assess and evaluate symptoms and progress in treatment, Medication management and Plan Reviewed with patient the difficulty of finding inpatient rehab especially if she insists on going home first or is not willing to come off of her methadone.  Still she wants me to continue the methadone for now.  I will speak with Zollie Scale who is the Child psychotherapist for the patient tomorrow and review the possible plan.  Otherwise no acute change to medicine.  Mordecai Rasmussen, MD 09/07/2018, 4:40 PM

## 2018-09-07 NOTE — Plan of Care (Signed)
Patient is alert and oriented to person, place and situation. Denies current SI, but states, "No not at this moment but earlier this morning I did." Patient verbally contracts for safety. Compliant with her medication protocol, up for meals and medications. Patient's mother came in today to get $95 that was locked up in the safe, her other belongings were left behind. Milieu remains safe with q 15 minute safety checks. Will continue to monitor.

## 2018-09-07 NOTE — BHH Group Notes (Signed)
LCSW Group Therapy Note  09/07/2018 1:00 PM  Type of Therapy/Topic:  Group Therapy:  Emotion Regulation  Participation Level:  Minimal   Description of Group:   The purpose of this group is to assist patients in learning to regulate negative emotions and experience positive emotions. Patients will be guided to discuss ways in which they have been vulnerable to their negative emotions. These vulnerabilities will be juxtaposed with experiences of positive emotions or situations, and patients will be challenged to use positive emotions to combat negative ones. Special emphasis will be placed on coping with negative emotions in conflict situations, and patients will process healthy conflict resolution skills.  Therapeutic Goals: 1. Patient will identify two positive emotions or experiences to reflect on in order to balance out negative emotions 2. Patient will label two or more emotions that they find the most difficult to experience 3. Patient will demonstrate positive conflict resolution skills through discussion and/or role plays  Summary of Patient Progress: Patient attended group, however fell asleep.    Therapeutic Modalities:   Cognitive Behavioral Therapy Feelings Identification Dialectical Behavioral Therapy  Penni Homans, MSW, LCSW 09/07/2018 2:04 PM

## 2018-09-08 LAB — HEPATITIS C GENOTYPE

## 2018-09-08 MED ORDER — METHADONE HCL 10 MG PO TABS
30.0000 mg | ORAL_TABLET | Freq: Every day | ORAL | Status: DC
  Administered 2018-09-09: 30 mg via ORAL
  Filled 2018-09-08: qty 3

## 2018-09-08 MED ORDER — IBUPROFEN 600 MG PO TABS
600.0000 mg | ORAL_TABLET | Freq: Three times a day (TID) | ORAL | Status: DC
  Administered 2018-09-08 – 2018-09-09 (×4): 600 mg via ORAL
  Filled 2018-09-08 (×4): qty 1

## 2018-09-08 MED ORDER — LORAZEPAM 0.5 MG PO TABS
0.5000 mg | ORAL_TABLET | Freq: Three times a day (TID) | ORAL | Status: DC
  Administered 2018-09-08 – 2018-09-09 (×3): 0.5 mg via ORAL
  Filled 2018-09-08 (×3): qty 1

## 2018-09-08 NOTE — Plan of Care (Signed)
Patient observed interacting appropriately with staff and peers on the unit. Patient was pleasant during assessment and medication administration. Patient denies SI/HI/AVH and stated she had a good.   Problem: Education: Goal: Emotional status will improve Outcome: Progressing Goal: Mental status will improve Outcome: Progressing

## 2018-09-08 NOTE — Progress Notes (Signed)
[facility] MD Progress Note  09/08/2018 3:29 PM Tara Raymond  MRN:  [id number] Subjective: Follow-up for this patient with anxiety depression and substance abuse pain chart reviewed.  Patient stated to me that she is still feeling anxious but it is getting better.  She repeated to me her complaint that she had pain all over her body.  Patient's affect appeared to be anxious.  She denied suicidal or homicidal ideation however.  She is aware of the plan in place to try and find her some kind of substance abuse treatment.  Later in the afternoon nursing found the patient passed out in her own sink apparently from the combination of medicine she is taking.  No sign that she got injured.  No evidence of acute suicidality or psychosis. Principal Problem: Severe major depression, single episode, without psychotic features (HCC) Diagnosis: Principal Problem:   Severe major depression, single episode, without psychotic features (HCC) Active Problems:   Substance induced mood disorder (HCC)   Opioid use disorder, severe, dependence (HCC)   Benzodiazepine abuse (HCC)   Urinary tract infection  Total Time spent with patient: 30 minutes  Past Psychiatric History: Patient has a history of depression agitation and also multiple drug abuse most recently opiates and benzodiazepines most prominent  Past Medical History:  Past Medical History:  Diagnosis Date  . Anemia   . Cocaine use disorder (HCC)   . Dyspnea    with pregnancy  . Mental disorder   . Opioid use disorder, moderate, dependence (HCC)   . Substance induced mood disorder [facility])     Past Surgical History:  Procedure Laterality Date  . CESAREAN SECTION N/A 02/09/2016   Procedure: CESAREAN SECTION;  Surgeon: Nadara Mustard, MD;  Location: [facility] ORS;  Service: Obstetrics;  Laterality: N/A;  . CESAREAN SECTION N/A 04/20/2017   Procedure: CESAREAN SECTION;  Surgeon: Vena Austria, MD;  Location: [facility] ORS;  Service: Obstetrics;  Laterality:  N/A;   Family History:  Family History  Problem Relation Age of Onset  . Drug abuse Mother    Family Psychiatric  History: None Social History:  Social History   Substance and Sexual Activity  Alcohol Use Yes     Social History   Substance and Sexual Activity  Drug Use Yes  . Frequency: 2.0 times per week  . Types: Cocaine, Marijuana, Heroin   Comment: heroin    Social History   Socioeconomic History  . Marital status: Single    Spouse name: Not on file  . Number of children: Not on file  . Years of education: Not on file  . Highest education level: Not on file  Occupational History  . Not on file  Social Needs  . Financial resource strain: Not on file  . Food insecurity:    Worry: Not on file    Inability: Not on file  . Transportation needs:    Medical: Not on file    Non-medical: Not on file  Tobacco Use  . Smoking status: Current Every Day Smoker    Packs/day: 0.50    Types: Cigarettes  . Smokeless tobacco: Current User  Substance and Sexual Activity  . Alcohol use: Yes  . Drug use: Yes    Frequency: 2.0 times per week    Types: Cocaine, Marijuana, Heroin    Comment: heroin  . Sexual activity: Yes  Lifestyle  . Physical activity:    Days per week: Not on file    Minutes per session: Not on file  .  Stress: Not on file  Relationships  . Social connections:    Talks on phone: Not on file    Gets together: Not on file    Attends religious service: Not on file    Active member of club or organization: Not on file    Attends meetings of clubs or organizations: Not on file    Relationship status: Not on file  Other Topics Concern  . Not on file  Social History Narrative  . Not on file   Additional Social History:                         Sleep: Fair  Appetite:  Fair  Current Medications: Current Facility-Administered Medications  Medication Dose Route Frequency Provider Last Rate Last Dose  . acetaminophen (TYLENOL) tablet 650 mg   650 mg Oral Q6H PRN Beverly Sessions, MD   650 mg at 09/07/18 2129  . alum & mag hydroxide-simeth (MAALOX/MYLANTA) 200-200-20 MG/5ML suspension 30 mL  30 mL Oral Q4H PRN Beverly Sessions, MD      . cephALEXin (KEFLEX) capsule 500 mg  500 mg Oral Q12H He, Jun, MD   500 mg at 09/08/18 0815  . FLUoxetine (PROZAC) capsule 20 mg  20 mg Oral Daily Clapacs, John T, MD   20 mg at 09/08/18 0815  . hydrOXYzine (ATARAX/VISTARIL) tablet 50 mg  50 mg Oral Q6H PRN Beverly Sessions, MD   50 mg at 09/07/18 1417  . ibuprofen (ADVIL,MOTRIN) tablet 600 mg  600 mg Oral TID Clapacs, Jackquline Denmark, MD   600 mg at 09/08/18 1126  . LORazepam (ATIVAN) tablet 0.5 mg  0.5 mg Oral TID Clapacs, John T, MD      . magnesium hydroxide (MILK OF MAGNESIA) suspension 30 mL  30 mL Oral Daily PRN Beverly Sessions, MD   30 mL at 09/06/18 0836  . [START ON 09/09/2018] methadone (DOLOPHINE) tablet 30 mg  30 mg Oral Daily Clapacs, John T, MD      . methocarbamol (ROBAXIN) tablet 750 mg  750 mg Oral Q8H PRN Clapacs, Jackquline Denmark, MD   750 mg at 09/05/18 1654  . nicotine (NICODERM CQ - dosed in mg/24 hours) patch 14 mg  14 mg Transdermal Daily Clapacs, Jackquline Denmark, MD   14 mg at 09/08/18 0810  . ondansetron (ZOFRAN) tablet 4 mg  4 mg Oral Q8H PRN Clapacs, John T, MD      . QUEtiapine (SEROQUEL) tablet 300 mg  300 mg Oral QHS Clapacs, John T, MD   300 mg at 09/07/18 2128  . traZODone (DESYREL) tablet 100 mg  100 mg Oral QHS PRN Beverly Sessions, MD   100 mg at 09/07/18 2127  . tuberculin injection 5 Units  5 Units Intradermal Once Clapacs, Jackquline Denmark, MD   5 Units at 09/07/18 1412    Lab Results: No results found for this or any previous visit (from the past 48 hour(s)).  Blood Alcohol level:  Lab Results  Component Value Date   ETH <10 09/02/2018   ETH <10 08/31/2018    Metabolic Disorder Labs: Lab Results  Component Value Date   HGBA1C 4.8 07/09/2018   MPG 91.06 07/09/2018   No results found for: PROLACTIN Lab Results  Component Value Date    CHOL 215 (H) 07/09/2018   TRIG 130 07/09/2018   HDL 41 07/09/2018   CHOLHDL 5.2 07/09/2018   VLDL 26 07/09/2018   LDLCALC 148 (H) 07/09/2018    Physical  Findings: AIMS:  , ,  ,  ,    CIWA:    COWS:     Musculoskeletal: Strength & Muscle Tone: within normal limits Gait & Station: normal Patient leans: N/A  Psychiatric Specialty Exam: Physical Exam  Nursing note and vitals reviewed. Constitutional: She appears well-developed and well-nourished.  HENT:  Head: Normocephalic and atraumatic.  Eyes: Pupils are equal, round, and reactive to light. Conjunctivae are normal.  Neck: Normal range of motion.  Cardiovascular: Regular rhythm and normal heart sounds.  Respiratory: Effort normal. No respiratory distress.  GI: Soft.  Musculoskeletal: Normal range of motion.  Neurological: She is alert.  Skin: Skin is warm and dry.  Psychiatric: Her mood appears anxious. Her speech is tangential. She is not agitated and not aggressive. Thought content is not paranoid. Cognition and memory are normal. She expresses impulsivity. She expresses no homicidal and no suicidal ideation.    Review of Systems  Constitutional: Negative.   HENT: Negative.   Eyes: Negative.   Respiratory: Negative.   Cardiovascular: Negative.   Gastrointestinal: Negative.   Musculoskeletal: Positive for joint pain.  Skin: Negative.   Neurological: Negative.   Psychiatric/Behavioral: Positive for depression. Negative for hallucinations, memory loss, substance abuse and suicidal ideas. The patient is nervous/anxious. The patient does not have insomnia.     Blood pressure 112/85, pulse 79, temperature 98.2 F (36.8 C), temperature source Oral, resp. rate 17, height 5' 1.42" (1.56 m), weight 51.3 kg, last menstrual period 08/22/2018, SpO2 100 %, unknown if currently breastfeeding.Body mass index is 21.06 kg/m.  General Appearance: Casual  Eye Contact:  Fair  Speech:  Slow  Volume:  Decreased  Mood:  Dysphoric   Affect:  Congruent  Thought Process:  Coherent  Orientation:  Full (Time, Place, and Person)  Thought Content:  Logical  Suicidal Thoughts:  No  Homicidal Thoughts:  No  Memory:  Immediate;   Fair Recent;   Fair Remote;   Fair  Judgement:  Fair  Insight:  Shallow  Psychomotor Activity:  Normal  Concentration:  Concentration: Fair  Recall:  [employer] of Knowledge:  Fair  Language:  Fair  Akathisia:  No  Handed:  Right  AIMS (if indicated):     Assets:  Desire for Improvement Housing Physical Health Resilience  ADL's:  Intact  Cognition:  WNL  Sleep:  Number of Hours: 5     Treatment Plan Summary: Plan Based on her presentation this afternoon she appears to be on too much medicine.  The Ativan was originally added because of Xanax withdrawal but I think we can taper that.  Cut that down to 1/2 mg and then discontinue it over the next couple days.  Patient asked me herself if we can taper her off of the methadone so that she can be ready to go to a substance abuse program.  Today we will cut that in half.  Also eliminate the standing Robaxin as it does not seem to be particularly necessary right now.  Added some Motrin for her complaints of pain.  Continue to engage her in substance abuse and daily assessment.  Probable length of stay another 2 to 3 days.  Mordecai Rasmussen, MD 09/08/2018, 3:29 PM

## 2018-09-08 NOTE — Progress Notes (Signed)
Recreation Therapy Notes   Date: 09/08/2018  Time: 9:30 am  Location: Craft Room  Behavioral response: Appropriate  Intervention Topic: Happiness  Discussion/Intervention:  Group content today was focused on Happiness. The group defined happiness and described where happiness comes from. Individuals identified what makes them happy and how they go about making others happy. Patients expressed things that stop them from being happy and ways they can improve their happiness. The group stated reasons why it is important to be happy. The group participated in the intervention "My Happiness", where they had a chance to identify and express things that make them happy. Clinical Observations/Feedback:  Patient came to group and identified yoga and watching television as what makes her happy. She explained that people being fake is what disrupts her happiness.  Individual was social with peers and staff while participating in group. Oleda Borski LRT/CTRS          Argie Applegate 09/08/2018 11:44 AM

## 2018-09-08 NOTE — BHH Group Notes (Signed)
Balance In Life 09/08/2018 1PM  Type of Therapy/Topic:  Group Therapy:  Balance in Life  Participation Level:  Minimal  Description of Group:   This group will address the concept of balance and how it feels and looks when one is unbalanced. Patients will be encouraged to process areas in their lives that are out of balance and identify reasons for remaining unbalanced. Facilitators will guide patients in utilizing problem-solving interventions to address and correct the stressor making their life unbalanced. Understanding and applying boundaries will be explored and addressed for obtaining and maintaining a balanced life. Patients will be encouraged to explore ways to assertively make their unbalanced needs known to significant others in their lives, using other group members and facilitator for support and feedback.  Therapeutic Goals: 1. Patient will identify two or more emotions or situations they have that consume much of in their lives. 2. Patient will identify signs/triggers that life has become out of balance:  3. Patient will identify two ways to set boundaries in order to achieve balance in their lives:  4. Patient will demonstrate ability to communicate their needs through discussion and/or role plays  Summary of Patient Progress:  Minimal participation. Pt presented to group in a trance like state and asked to be excused to her room, did not return.   Therapeutic Modalities:   Cognitive Behavioral Therapy Solution-Focused Therapy Assertiveness Training  Chynna Buerkle Philip Aspen, LCSW

## 2018-09-08 NOTE — Progress Notes (Signed)
D - Patient was in the day room upon arrival to the unit. Patient was pleasant during assessment and medication administration. Patient denies SI/HI/AVH. Patient reported her pain was 7/10, generalized pain. See MAR. Patient stated to this writer that her depression and anxiety had been getting better but it was still here.   A - Patient compliant with medication administration per MD orders and procedures on the unit. Patient given education. Patient given support and encouragement to be active in her treatment plan. Patient informed to let staff know if there are any issues or problems on the unit.   R - Patient being monitored Q 15 minutes for safety per unit protocol. Patient remains safe on the unit.

## 2018-09-08 NOTE — Plan of Care (Addendum)
Patient alert and oriented x 4. Present in the milieu this shift, ambulating the unit with a steady gait. Denies having any thoughts of wanting to harm herself. Continues to endorse anxiety, patient has scheduled Ativan to combat those symptoms with effectiveness. Patient showered today, combed her hair and changed clothes. Positive interaction noted with peers. Compliant with treatment  plan. Wrote on her patient inventory sheet, "I think I'm ready to go, I would like to leave on Friday." Milieu remains safe with q 15 minute safety checks, will continue to monitor.

## 2018-09-09 MED ORDER — QUETIAPINE FUMARATE 300 MG PO TABS: 300.0000 mg | ORAL_TABLET | Freq: Every day | ORAL | 1 refills | Status: AC

## 2018-09-09 MED ORDER — TRAZODONE HCL 100 MG PO TABS: 100.0000 mg | ORAL_TABLET | Freq: Every evening | ORAL | 1 refills | Status: AC | PRN

## 2018-09-09 MED ORDER — FLUOXETINE HCL 20 MG PO CAPS: 20.0000 mg | ORAL_CAPSULE | Freq: Every day | ORAL | 1 refills | Status: DC

## 2018-09-09 MED ORDER — FLUOXETINE HCL 20 MG PO CAPS: 40.0000 mg | ORAL_CAPSULE | Freq: Every day | ORAL | 1 refills | Status: AC

## 2018-09-09 MED ORDER — CEPHALEXIN 500 MG PO CAPS: 500.0000 mg | ORAL_CAPSULE | Freq: Two times a day (BID) | ORAL | 0 refills | Status: AC

## 2018-09-09 NOTE — Progress Notes (Signed)
D - Patient was in the day room upon arrival to the unit. Patient was pleasant during assessment and medication administration. Patient denies SI/HI/AVH. Patient reported her pain was 6/10, generalized pain. See MAR. Patient stated to this writer that her depression and anxiety had been getting better but it was still here. Patient stated that she mentally felt like she was ready to go home. Patient was observed at 1200 yelling on the hallway and needed to be redirected to her romo   A - Patient compliant with medication administration per MD orders and procedures on the unit. Patient given education. Patient given support and encouragement to be active in her treatment plan. Patient informed to let staff know if there are any issues or problems on the unit.   R - Patient being monitored Q 15 minutes for safety per unit protocol. Patient remains safe on the unit.

## 2018-09-09 NOTE — Plan of Care (Signed)
Patient stated to this writer that she thought she was mentally ready to go home but the patient was observed yelling at 1200 on the hallway and had to be redirected to her room.   Problem: Education: Goal: Emotional status will improve Outcome: Not Progressing Goal: Mental status will improve Outcome: Not Progressing

## 2018-09-09 NOTE — Progress Notes (Signed)
Recreation Therapy Notes   Date: 09/09/2018  Time: 9:30 am  Location: Craft Room  Behavioral response: Appropriate  Intervention Topic: Communication  Discussion/Intervention:  Group content today was focused on communication. The group defined communication and ways to communicate with others. Individuals stated reason why communication is important and some reasons to communicate with others. Patients expressed if they thought they were good at communicating with others and ways they could improve their communication skills. The group identified important parts of communication and some experiences they have had in the past with communication. The group participated in the intervention "What is that?", where they had a chance to test out their communication skills and identify ways to improve their communication techniques.  Clinical Observations/Feedback:  Patient came to group late and stated when she is not able to get a word in a conversation she no longer likes to communicate with that person. Individual was social with peers and staff while participating in group.  Tanyia Grabbe LRT/CTRS         Verble Styron 09/09/2018 11:26 AM

## 2018-09-09 NOTE — Progress Notes (Signed)
Patient alert and oriented x 4. Ambulates unit with steady gait. Verbally denies SI/HI/AVH and pain. Patient discharged on above date and time. Verbalized understanding the discharge information provided to patient upon discharge. Patient departed unit with discharge paperwork, prescriptions and personal belongings. Picked up by her mother to go home and follow up with [facility].

## 2018-09-09 NOTE — Tx Team (Signed)
Interdisciplinary Treatment and Diagnostic Plan Update  09/09/2018 Time of Session: 830AM Tara Raymond MRN: [id number]  Principal Diagnosis: Severe major depression, single episode, without psychotic features [facility])  Secondary Diagnoses: Principal Problem:   Severe major depression, single episode, without psychotic features (HCC) Active Problems:   Substance induced mood disorder (HCC)   Opioid use disorder, severe, dependence (HCC)   Benzodiazepine abuse (HCC)   Urinary tract infection   Current Medications:  Current Facility-Administered Medications  Medication Dose Route Frequency Provider Last Rate Last Dose  . acetaminophen (TYLENOL) tablet 650 mg  650 mg Oral Q6H PRN Beverly Sessions, MD   650 mg at 09/09/18 1042  . alum & mag hydroxide-simeth (MAALOX/MYLANTA) 200-200-20 MG/5ML suspension 30 mL  30 mL Oral Q4H PRN Beverly Sessions, MD      . cephALEXin (KEFLEX) capsule 500 mg  500 mg Oral Q12H He, Jun, MD   500 mg at 09/09/18 0816  . FLUoxetine (PROZAC) capsule 20 mg  20 mg Oral Daily Clapacs, John T, MD   20 mg at 09/09/18 0815  . hydrOXYzine (ATARAX/VISTARIL) tablet 50 mg  50 mg Oral Q6H PRN Beverly Sessions, MD   50 mg at 09/07/18 1417  . ibuprofen (ADVIL,MOTRIN) tablet 600 mg  600 mg Oral TID Clapacs, Jackquline Denmark, MD   600 mg at 09/09/18 0816  . LORazepam (ATIVAN) tablet 0.5 mg  0.5 mg Oral TID Clapacs, Jackquline Denmark, MD   0.5 mg at 09/09/18 0816  . magnesium hydroxide (MILK OF MAGNESIA) suspension 30 mL  30 mL Oral Daily PRN Beverly Sessions, MD   30 mL at 09/06/18 0836  . methadone (DOLOPHINE) tablet 30 mg  30 mg Oral Daily Clapacs, Jackquline Denmark, MD   30 mg at 09/09/18 0816  . methocarbamol (ROBAXIN) tablet 750 mg  750 mg Oral Q8H PRN Clapacs, Jackquline Denmark, MD   750 mg at 09/05/18 1654  . nicotine (NICODERM CQ - dosed in mg/24 hours) patch 14 mg  14 mg Transdermal Daily Clapacs, John T, MD   14 mg at 09/09/18 0800  . ondansetron (ZOFRAN) tablet 4 mg  4 mg Oral Q8H PRN Clapacs, John T,  MD      . QUEtiapine (SEROQUEL) tablet 300 mg  300 mg Oral QHS Clapacs, John T, MD   300 mg at 09/08/18 2133  . traZODone (DESYREL) tablet 100 mg  100 mg Oral QHS PRN Beverly Sessions, MD   100 mg at 09/08/18 2133  . tuberculin injection 5 Units  5 Units Intradermal Once Clapacs, Jackquline Denmark, MD   5 Units at 09/07/18 1412   PTA Medications: Medications Prior to Admission  Medication Sig Dispense Refill Last Dose  . methadone (DOLOPHINE) 10 MG tablet Take 80 mg by mouth daily.     . [DISCONTINUED] FLUoxetine (PROZAC) 20 MG capsule Take 1 capsule (20 mg total) by mouth daily. 30 capsule 3   . [DISCONTINUED] QUEtiapine (SEROQUEL) 300 MG tablet Take 1 tablet (300 mg total) by mouth at bedtime. 30 tablet 1   . [DISCONTINUED] traZODone (DESYREL) 100 MG tablet Take 1 tablet (100 mg total) by mouth at bedtime as needed for sleep. 30 tablet 1     Patient Stressors: Financial difficulties Health problems Occupational concerns Substance abuse  Patient Strengths: Ability for insight Communication skills  Treatment Modalities: Medication Management, Group therapy, Case management,  1 to 1 session with clinician, Psychoeducation, Recreational therapy.   Physician Treatment Plan for Primary Diagnosis: Severe major depression, single episode, without psychotic features (  HCC) Long Term Goal(s): Improvement in symptoms so as ready for discharge Improvement in symptoms so as ready for discharge   Short Term Goals: Ability to identify changes in lifestyle to reduce recurrence of condition will improve Ability to verbalize feelings will improve Ability to disclose and discuss suicidal ideas Ability to demonstrate self-control will improve Ability to identify and develop effective coping behaviors will improve Ability to maintain clinical measurements within normal limits will improve Compliance with prescribed medications will improve Ability to identify triggers associated with substance abuse/mental  health issues will improve Ability to identify changes in lifestyle to reduce recurrence of condition will improve Ability to verbalize feelings will improve Ability to disclose and discuss suicidal ideas Ability to demonstrate self-control will improve Ability to identify and develop effective coping behaviors will improve Ability to maintain clinical measurements within normal limits will improve Compliance with prescribed medications will improve Ability to identify triggers associated with substance abuse/mental health issues will improve  Medication Management: Evaluate patient's response, side effects, and tolerance of medication regimen.  Therapeutic Interventions: 1 to 1 sessions, Unit Group sessions and Medication administration.  Evaluation of Outcomes: Adequate for Discharge  Physician Treatment Plan for Secondary Diagnosis: Principal Problem:   Severe major depression, single episode, without psychotic features (HCC) Active Problems:   Substance induced mood disorder (HCC)   Opioid use disorder, severe, dependence (HCC)   Benzodiazepine abuse (HCC)   Urinary tract infection  Long Term Goal(s): Improvement in symptoms so as ready for discharge Improvement in symptoms so as ready for discharge   Short Term Goals: Ability to identify changes in lifestyle to reduce recurrence of condition will improve Ability to verbalize feelings will improve Ability to disclose and discuss suicidal ideas Ability to demonstrate self-control will improve Ability to identify and develop effective coping behaviors will improve Ability to maintain clinical measurements within normal limits will improve Compliance with prescribed medications will improve Ability to identify triggers associated with substance abuse/mental health issues will improve Ability to identify changes in lifestyle to reduce recurrence of condition will improve Ability to verbalize feelings will improve Ability to  disclose and discuss suicidal ideas Ability to demonstrate self-control will improve Ability to identify and develop effective coping behaviors will improve Ability to maintain clinical measurements within normal limits will improve Compliance with prescribed medications will improve Ability to identify triggers associated with substance abuse/mental health issues will improve     Medication Management: Evaluate patient's response, side effects, and tolerance of medication regimen.  Therapeutic Interventions: 1 to 1 sessions, Unit Group sessions and Medication administration.  Evaluation of Outcomes: Adequate for Discharge   RN Treatment Plan for Primary Diagnosis: Severe major depression, single episode, without psychotic features (HCC) Long Term Goal(s): Knowledge of disease and therapeutic regimen to maintain health will improve  Short Term Goals: Ability to demonstrate self-control, Ability to participate in decision making will improve and Ability to identify and develop effective coping behaviors will improve  Medication Management: RN will administer medications as ordered by provider, will assess and evaluate patient's response and provide education to patient for prescribed medication. RN will report any adverse and/or side effects to prescribing provider.  Therapeutic Interventions: 1 on 1 counseling sessions, Psychoeducation, Medication administration, Evaluate responses to treatment, Monitor vital signs and CBGs as ordered, Perform/monitor CIWA, COWS, AIMS and Fall Risk screenings as ordered, Perform wound care treatments as ordered.  Evaluation of Outcomes: Adequate for Discharge   LCSW Treatment Plan for Primary Diagnosis: Severe major depression, single episode,  without psychotic features [facility]) Long Term Goal(s): Safe transition to appropriate next level of care at discharge, Engage patient in therapeutic group addressing interpersonal concerns.  Short Term Goals: Engage  patient in aftercare planning with referrals and resources, Identify triggers associated with mental health/substance abuse issues and Increase skills for wellness and recovery  Therapeutic Interventions: Assess for all discharge needs, 1 to 1 time with Social worker, Explore available resources and support systems, Assess for adequacy in community support network, Educate family and significant other(s) on suicide prevention, Complete Psychosocial Assessment, Interpersonal group therapy.  Evaluation of Outcomes: Adequate for Discharge   Progress in Treatment: Attending groups: Yes. Participating in groups: Yes. Taking medication as prescribed: Yes. Toleration medication: Yes. Family/Significant other contact made: No, will contact:  SPE completed with pt as pt declined collateral contact Patient understands diagnosis: Yes. Discussing patient identified problems/goals with staff: Yes. Medical problems stabilized or resolved: Yes. Denies suicidal/homicidal ideation: Yes. Issues/concerns per patient self-inventory: No. Other: n/a  New problem(s) identified: No, Describe:  n/a  New Short Term/Long Term Goal(s): Detox, elimination of AVH/symptoms of psychosis, medication management for mood stabilization; elimination of SI thoughts; development of comprehensive mental wellness/sobriety plan.   Patient Goals:  "Go home for a few days and then go to rehab"  Discharge Plan or Barriers: SPE pamphlet, Mobile Crisis information, and AA/NA information provided to patient for additional community support and resources. Pt was accepted to [facility] opiate treatment program, but declines admission. Pt accepted to [facility] and would have to go today 09/09/2018, but pt declines to go due to not having time to be home first. [facility] referral was made.  Reason for Continuation of Hospitalization: none  Estimated Length of Stay: Today 09/09/2018  Attendees: Patient: 09/09/2018 11:18 AM  Physician:  09/09/2018  11:18 AM  Nursing:  09/09/2018 11:18 AM  RN Care Manager: 09/09/2018 11:18 AM  Social Worker: Zollie Scale Othello Sgroi LCSW 09/09/2018 11:18 AM  Recreational Therapist:  09/09/2018 11:18 AM  Other:  09/09/2018 11:18 AM  Other:  09/09/2018 11:18 AM  Other: 09/09/2018 11:18 AM    Scribe for Treatment Team: Charlann Lange Ramona Ruark, LCSW 09/09/2018 11:18 AM

## 2018-09-09 NOTE — [facility] Suicide Risk Assessment (Signed)
[facility] Discharge Suicide Risk Assessment   Principal Problem: Severe major depression, single episode, without psychotic features [facility]) Discharge Diagnoses: Principal Problem:   Severe major depression, single episode, without psychotic features (HCC) Active Problems:   Substance induced mood disorder (HCC)   Opioid use disorder, severe, dependence (HCC)   Benzodiazepine abuse (HCC)   Urinary tract infection   Total Time spent with patient: 45 minutes  Musculoskeletal: Strength & Muscle Tone: within normal limits Gait & Station: normal Patient leans: N/A  Psychiatric Specialty Exam: Review of Systems  Constitutional: Negative.   HENT: Negative.   Eyes: Negative.   Respiratory: Negative.   Cardiovascular: Negative.   Gastrointestinal: Negative.   Musculoskeletal: Negative.   Skin: Negative.   Neurological: Negative.   Psychiatric/Behavioral: Negative.     Blood pressure 120/79, pulse 90, temperature 97.9 F (36.6 C), temperature source Oral, resp. rate 18, height 5' 1.42" (1.56 m), weight 51.3 kg, last menstrual period 08/22/2018, SpO2 100 %, unknown if currently breastfeeding.Body mass index is 21.06 kg/m.  General Appearance: Casual  Eye Contact::  Good  Speech:  Clear and Coherent409  Volume:  Normal  Mood:  Euthymic  Affect:  Congruent  Thought Process:  Goal Directed  Orientation:  Full (Time, Place, and Person)  Thought Content:  Logical  Suicidal Thoughts:  No  Homicidal Thoughts:  No  Memory:  Immediate;   Fair Recent;   Fair Remote;   Fair  Judgement:  Fair  Insight:  Fair  Psychomotor Activity:  Normal  Concentration:  Fair  Recall:  [employer] of Knowledge:Fair  Language: Fair  Akathisia:  No  Handed:  Right  AIMS (if indicated):     Assets:  Desire for Improvement Housing  Sleep:  Number of Hours: 4.75  Cognition: WNL  ADL's:  Intact   Mental Status Per Nursing Assessment::   On Admission:  Self-harm thoughts  Demographic Factors:  Low  socioeconomic status  Loss Factors: Financial problems/change in socioeconomic status  Historical Factors: Impulsivity  Risk Reduction Factors:   Positive social support and Positive therapeutic relationship  Continued Clinical Symptoms:  Alcohol/Substance Abuse/Dependencies  Cognitive Features That Contribute To Risk:  Closed-mindedness    Suicide Risk:  Minimal: No identifiable suicidal ideation.  Patients presenting with no risk factors but with morbid ruminations; may be classified as minimal risk based on the severity of the depressive symptoms  Follow-up Information    [facility] Follow up.   Specialty:  Addiction Medicine Why:  Referral sent Contact information: [address] [place] [address] [phone]        [facility] Follow up.   Why:  referral sent Contact information: [address] [place] [address] [phone]        [facility] Follow up.   Why:  referral sent Contact information: [address] [place] [address] [phone]        [facility] Follow up.   Why:  You have a hospital disscharge appointment scheduled for Friday 10/07/2018 at 12:30PM. Thank You! Contact information: [address] [place] [address] [phone]        [facility] Follow up.   Why:  You can show up anyday Monday-Friday at 5:00 AM for intake process. Contact information: [address] [place], [place] [address] Phone: [phone] Fax: [phone]          Plan Of Care/Follow-up recommendations:  Activity:  Activity as  tolerated Diet:  Regular diet Other:  Patient has been given extensive resources to follow-up with outpatient substance abuse treatment and to refer to inpatient substance abuse treatment  Mordecai Rasmussen, MD 09/09/2018, 9:24 AM

## 2018-09-09 NOTE — Progress Notes (Signed)
Recreation Therapy Notes  INPATIENT RECREATION TR PLAN  Patient Details Name: Tara Raymond MRN: [id number] DOB: 1990-11-02 Today's Date: 09/09/2018  Rec Therapy Plan Is patient appropriate for Therapeutic Recreation?: Yes Treatment times per week: at least 3 Estimated Length of Stay: 5-7 days TR Treatment/Interventions: Group participation (Comment)  Discharge Criteria Pt will be discharged from therapy if:: Discharged Treatment plan/goals/alternatives discussed and agreed upon by:: Patient/family  Discharge Summary Short term goals set: Patient will identify 3 resources in their community to aid in recovery within 5 recreation therapy group sessions Short term goals met: Complete Progress toward goals comments: Groups attended Which groups?: Communication, Coping skills, Goal setting, Other (Comment)(Happiness) Reason goals not met: N/A Therapeutic equipment acquired: N/A Reason patient discharged from therapy: Discharge from hospital Pt/family agrees with progress & goals achieved: Yes Date patient discharged from therapy: 09/09/18   Manuelito Poage 09/09/2018, 11:34 AM

## 2018-09-09 NOTE — Progress Notes (Signed)
PPD skin test read with negative results, no induration at injection site. Documented in patient's flow sheet.

## 2018-09-09 NOTE — Progress Notes (Signed)
  [facility] Adult Case Management Discharge Plan :  Will you be returning to the same living situation after discharge:  Yes,  home At discharge, do you have transportation home?: Yes,  mother Do you have the ability to pay for your medications: Yes,  Mental health  Release of information consent forms completed and in the chart;   Patient to Follow up at: Follow-up Information    [facility] Follow up.   Why:  Call Olegario Messier Monday morning at 9:00AM to discuss possibility of admission. If admitted for Tuesday, you will be drug tested and must be negative. Contact information: [address] [place] [address] [phone]        [facility] Follow up.   Why:  You have a hospital disscharge appointment scheduled for Friday 10/07/2018 at 12:30PM. Thank You! Contact information: [address] [place] [address] [phone]        [facility] Follow up.   Why:  If you wish to go to a suboxone clinic, you can show up anyday Monday-Friday at 5:00 AM for intake process. Thank you! Contact information: [address] [place], [place] [address] Phone: [phone] Fax: [phone]          Next level of care provider has access to [facility] Link:no  Safety Planning and Suicide Prevention discussed: Yes,  SPE completed with pt as pt declined collateral contact  Have you used any form of tobacco in the last 30 days? (Cigarettes, Smokeless Tobacco, Cigars, and/or Pipes): Yes  Has patient been referred to the Quitline?: Patient refused referral  Patient has been referred for addiction treatment: Yes  Charlann Lange Donella Pascarella, LCSW 09/09/2018, 2:01 PM

## 2018-09-10 NOTE — Discharge Summary (Signed)
Physician Discharge Summary Note  Patient:  Tara Raymond is an 28 y.o., female MRN:  [id number] DOB:  18-May-1991 Patient phone:  [phone] (home)  Patient address:   [address] [place] [address],  Total Time spent with patient: 45 minutes  Date of Admission:  09/03/2018 Date of Discharge: September 09, 2018  Reason for Admission: Patient was admitted after an overdose on heroin which was nearly fatal.  Patient had stated that she was suicidal at the time. Principal Problem: Severe major depression, single episode, without psychotic features [facility]) Discharge Diagnoses: Principal Problem:   Severe major depression, single episode, without psychotic features (HCC) Active Problems:   Substance induced mood disorder (HCC)   Opioid use disorder, severe, dependence (HCC)   Benzodiazepine abuse (HCC)   Urinary tract infection   Past Psychiatric History: Patient has a history of depression and polysubstance abuse  Past Medical History:  Past Medical History:  Diagnosis Date  . Anemia   . Cocaine use disorder (HCC)   . Dyspnea    with pregnancy  . Mental disorder   . Opioid use disorder, moderate, dependence (HCC)   . Substance induced mood disorder [facility])     Past Surgical History:  Procedure Laterality Date  . CESAREAN SECTION N/A 02/09/2016   Procedure: CESAREAN SECTION;  Surgeon: Nadara Mustard, MD;  Location: [facility] ORS;  Service: Obstetrics;  Laterality: N/A;  . CESAREAN SECTION N/A 04/20/2017   Procedure: CESAREAN SECTION;  Surgeon: Vena Austria, MD;  Location: [facility] ORS;  Service: Obstetrics;  Laterality: N/A;   Family History:  Family History  Problem Relation Age of Onset  . Drug abuse Mother    Family Psychiatric  History: See previous Social History:  Social History   Substance and Sexual Activity  Alcohol Use Yes     Social History   Substance and Sexual Activity  Drug Use Yes  . Frequency: 2.0 times per week  . Types: Cocaine,  Marijuana, Heroin   Comment: heroin    Social History   Socioeconomic History  . Marital status: Single    Spouse name: Not on file  . Number of children: Not on file  . Years of education: Not on file  . Highest education level: Not on file  Occupational History  . Not on file  Social Needs  . Financial resource strain: Not on file  . Food insecurity:    Worry: Not on file    Inability: Not on file  . Transportation needs:    Medical: Not on file    Non-medical: Not on file  Tobacco Use  . Smoking status: Current Every Day Smoker    Packs/day: 0.50    Types: Cigarettes  . Smokeless tobacco: Current User  Substance and Sexual Activity  . Alcohol use: Yes  . Drug use: Yes    Frequency: 2.0 times per week    Types: Cocaine, Marijuana, Heroin    Comment: heroin  . Sexual activity: Yes  Lifestyle  . Physical activity:    Days per week: Not on file    Minutes per session: Not on file  . Stress: Not on file  Relationships  . Social connections:    Talks on phone: Not on file    Gets together: Not on file    Attends religious service: Not on file    Active member of club or organization: Not on file    Attends meetings of clubs or organizations: Not on file  Relationship status: Not on file  Other Topics Concern  . Not on file  Social History Narrative  . Not on file    Hospital Course: Admitted to the hospital after being stabilized for overdose.  Patient was cooperative with treatment.  She had previously been attending a methadone program and was continued on methadone at a somewhat decreased dose while in the hospital as she was initially planning to go back to methadone program.  She was given some medication for detox from benzodiazepines.  Patient was restarted on Prozac Seroquel and trazodone which had been helpful in the past.  Gradually became more interactive attending groups.  Did not show any suicidal behavior.  Patient was interested in attending inpatient  substance abuse treatment and worked with social work on that but nothing was possible prior to discharge and the patient very much wanted to go home.  She understands that to go to any kind of rehab program she will be having to stop taking the methadone and any controlled substances.  Patient has not given a new prescription for methadone at discharge but instructed to detox as we had started to do and to follow-up with her outpatient providers.  Patient is not expressing any suicidal ideation at the time of discharge. Physical Findings: AIMS:  , ,  ,  ,    CIWA:    COWS:     Musculoskeletal: Strength & Muscle Tone: within normal limits Gait & Station: normal Patient leans: N/A  Psychiatric Specialty Exam: Physical Exam  Constitutional: She appears well-developed and well-nourished.  HENT:  Head: Normocephalic and atraumatic.  Eyes: Pupils are equal, round, and reactive to light. Conjunctivae are normal.  Neck: Normal range of motion.  Cardiovascular: Normal heart sounds.  Respiratory: Effort normal.  GI: Soft.  Musculoskeletal: Normal range of motion.  Neurological: She is alert.  Skin: Skin is warm and dry.  Psychiatric: She has a normal mood and affect. Her behavior is normal. Judgment and thought content normal.    Review of Systems  Constitutional: Negative.   HENT: Negative.   Eyes: Negative.   Respiratory: Negative.   Cardiovascular: Negative.   Gastrointestinal: Negative.   Musculoskeletal: Negative.   Skin: Negative.   Neurological: Negative.   Psychiatric/Behavioral: Negative.     Blood pressure 120/79, pulse 90, temperature 97.9 F (36.6 C), temperature source Oral, resp. rate 18, height 5' 1.42" (1.56 m), weight 51.3 kg, last menstrual period 08/22/2018, SpO2 100 %, unknown if currently breastfeeding.Body mass index is 21.06 kg/m.  General Appearance: Casual  Eye Contact:  Good  Speech:  Slow  Volume:  Decreased  Mood:  Euthymic  Affect:  Congruent   Thought Process:  Goal Directed  Orientation:  Full (Time, Place, and Person)  Thought Content:  Logical  Suicidal Thoughts:  No  Homicidal Thoughts:  No  Memory:  Immediate;   Fair Recent;   Fair Remote;   Fair  Judgement:  Fair  Insight:  Fair  Psychomotor Activity:  Normal  Concentration:  Concentration: Fair  Recall:  [employer] of Knowledge:  Fair  Language:  Fair  Akathisia:  No  Handed:  Right  AIMS (if indicated):     Assets:  Desire for Improvement Physical Health  ADL's:  Intact  Cognition:  WNL  Sleep:  Number of Hours: 4.75     Have you used any form of tobacco in the last 30 days? (Cigarettes, Smokeless Tobacco, Cigars, and/or Pipes): Yes  Has this patient used any  form of tobacco in the last 30 days? (Cigarettes, Smokeless Tobacco, Cigars, and/or Pipes) Yes, No  Blood Alcohol level:  Lab Results  Component Value Date   ETH <10 09/02/2018   ETH <10 08/31/2018    Metabolic Disorder Labs:  Lab Results  Component Value Date   HGBA1C 4.8 07/09/2018   MPG 91.06 07/09/2018   No results found for: PROLACTIN Lab Results  Component Value Date   CHOL 215 (H) 07/09/2018   TRIG 130 07/09/2018   HDL 41 07/09/2018   CHOLHDL 5.2 07/09/2018   VLDL 26 07/09/2018   LDLCALC 148 (H) 07/09/2018    See Psychiatric Specialty Exam and Suicide Risk Assessment completed by Attending Physician prior to discharge.  Discharge destination:  Home  Is patient on multiple antipsychotic therapies at discharge:  No   Has Patient had three or more failed trials of antipsychotic monotherapy by history:  No  Recommended Plan for Multiple Antipsychotic Therapies: NA  Discharge Instructions    Diet - low sodium heart healthy   Complete by:  As directed    Increase activity slowly   Complete by:  As directed      Allergies as of 09/09/2018      Reactions   Naproxen Hives      Medication List    TAKE these medications     Indication  cephALEXin 500 MG  capsule Commonly known as:  KEFLEX Take 1 capsule (500 mg total) by mouth every 12 (twelve) hours.  Indication:  Infection of Genitals and/or Urinary Tract   FLUoxetine 20 MG capsule Commonly known as:  PROZAC Take 2 capsules (40 mg total) by mouth daily. What changed:  how much to take  Indication:  Depression   methadone 10 MG tablet Commonly known as:  DOLOPHINE Take 80 mg by mouth daily.  Indication:  Opioid Dependence   QUEtiapine 300 MG tablet Commonly known as:  SEROQUEL Take 1 tablet (300 mg total) by mouth at bedtime.  Indication:  Major Depressive Disorder   traZODone 100 MG tablet Commonly known as:  DESYREL Take 1 tablet (100 mg total) by mouth at bedtime as needed for sleep.  Indication:  Trouble Sleeping      Follow-up Information    [facility] Follow up.   Why:  Call Olegario Messier Monday morning at 9:00AM to discuss possibility of admission. If admitted for Tuesday, you will be drug tested and must be negative. Contact information: [address] [place] [address] [phone]        [facility] Follow up.   Why:  You have a hospital disscharge appointment scheduled for Friday 10/07/2018 at 12:30PM. Thank You! Contact information: [address] [place] [address] [phone]        [facility] Follow up.   Why:  If you wish to go to a suboxone clinic, you can show up anyday Monday-Friday at 5:00 AM for intake process. Thank you! Contact information: [address] [place], [place] [address] Phone: [phone] Fax: [phone]          Follow-up recommendations:  Activity:  Activity as tolerated Diet:  Regular diet Other:  Pursue outpatient substance abuse treatment in concert with [facility] and recommendations for rehab  Comments: Patient agreed to the plan.  Affect euthymic behavior calm and appropriate no sign of acute suicidality at discharge  Signed: Mordecai Rasmussen, MD 09/10/2018, 5:19 PM

## 2018-09-18 ENCOUNTER — Other Ambulatory Visit: Payer: Self-pay

## 2018-09-18 ENCOUNTER — Emergency Department: Payer: Medicaid Other

## 2018-09-18 ENCOUNTER — Emergency Department
Admission: EM | Admit: 2018-09-18 | Discharge: 2018-09-18 | Disposition: A | Discharge status: Home / Self Care | Payer: Medicaid Other | Attending: Emergency Medicine | Admitting: Emergency Medicine

## 2018-09-18 DIAGNOSIS — F1410 Cocaine abuse, uncomplicated: Secondary | ICD-10-CM | POA: Insufficient documentation

## 2018-09-18 DIAGNOSIS — F322 Major depressive disorder, single episode, severe without psychotic features: Secondary | ICD-10-CM | POA: Insufficient documentation

## 2018-09-18 DIAGNOSIS — F17210 Nicotine dependence, cigarettes, uncomplicated: Secondary | ICD-10-CM | POA: Insufficient documentation

## 2018-09-18 DIAGNOSIS — T50902A Poisoning by unspecified drugs, medicaments and biological substances, intentional self-harm, initial encounter: Secondary | ICD-10-CM

## 2018-09-18 DIAGNOSIS — F1110 Opioid abuse, uncomplicated: Secondary | ICD-10-CM | POA: Insufficient documentation

## 2018-09-18 DIAGNOSIS — F1914 Other psychoactive substance abuse with psychoactive substance-induced mood disorder: Secondary | ICD-10-CM | POA: Insufficient documentation

## 2018-09-18 DIAGNOSIS — F1210 Cannabis abuse, uncomplicated: Secondary | ICD-10-CM | POA: Insufficient documentation

## 2018-09-18 DIAGNOSIS — F17228 Nicotine dependence, chewing tobacco, with other nicotine-induced disorders: Secondary | ICD-10-CM | POA: Insufficient documentation

## 2018-09-18 DIAGNOSIS — Z79899 Other long term (current) drug therapy: Secondary | ICD-10-CM | POA: Insufficient documentation

## 2018-09-18 LAB — COMPREHENSIVE METABOLIC PANEL
ALT: 60 U/L — ABNORMAL HIGH (ref 0–44)
AST: 51 U/L — ABNORMAL HIGH (ref 15–41)
Albumin: 3.4 g/dL — ABNORMAL LOW (ref 3.5–5.0)
Alkaline Phosphatase: 59 U/L (ref 38–126)
Anion gap: 7 (ref 5–15)
BILIRUBIN TOTAL: 0.3 mg/dL (ref 0.3–1.2)
BUN: 17 mg/dL (ref 6–20)
CO2: 24 mmol/L (ref 22–32)
Calcium: 8.5 mg/dL — ABNORMAL LOW (ref 8.9–10.3)
Chloride: 107 mmol/L (ref 98–111)
Creatinine, Ser: 0.69 mg/dL (ref 0.44–1.00)
GFR calc Af Amer: >60 mL/min (ref >60)
Glucose, Bld: 133 mg/dL — ABNORMAL HIGH (ref 70–99)
Potassium: 3.5 mmol/L (ref 3.5–5.1)
Sodium: 138 mmol/L (ref 135–145)
Total Protein: 6.4 g/dL — ABNORMAL LOW (ref 6.5–8.1)

## 2018-09-18 LAB — BLOOD GAS, ARTERIAL
Acid-Base Excess: 1.5 mmol/L (ref 0.0–2.0)
Bicarbonate: 26.6 mmol/L (ref 20.0–28.0)
FIO2: 0.21
O2 Saturation: 97.3 %
Patient temperature: 37
pCO2 arterial: 43 mmHg (ref 32.0–48.0)
pH, Arterial: 7.4 (ref 7.350–7.450)
pO2, Arterial: 94 mmHg (ref 83.0–108.0)

## 2018-09-18 LAB — CBC WITH DIFFERENTIAL/PLATELET
Abs Immature Granulocytes: 0.01 10*3/uL (ref 0.00–0.07)
Basophils Absolute: 0 10*3/uL (ref 0.0–0.1)
Basophils Relative: 0 %
EOS ABS: 0 10*3/uL (ref 0.0–0.5)
Eosinophils Relative: 1 %
HEMATOCRIT: 32.3 % — AB (ref 36.0–46.0)
Hemoglobin: 10.4 g/dL — ABNORMAL LOW (ref 12.0–15.0)
Immature Granulocytes: 0 %
Lymphocytes Relative: 54 %
Lymphs Abs: 2.6 10*3/uL (ref 0.7–4.0)
MCH: 28.4 pg (ref 26.0–34.0)
MCHC: 32.2 g/dL (ref 30.0–36.0)
MCV: 88.3 fL (ref 80.0–100.0)
Monocytes Absolute: 0.5 10*3/uL (ref 0.1–1.0)
Monocytes Relative: 10 %
NEUTROS PCT: 35 %
Neutro Abs: 1.7 10*3/uL (ref 1.7–7.7)
Platelets: 222 10*3/uL (ref 150–400)
RBC: 3.66 MIL/uL — ABNORMAL LOW (ref 3.87–5.11)
RDW: 14.2 % (ref 11.5–15.5)
WBC: 4.8 10*3/uL (ref 4.0–10.5)
nRBC: 0 % (ref 0.0–0.2)

## 2018-09-18 LAB — ETHANOL: Alcohol, Ethyl (B): <10 mg/dL (ref <10)

## 2018-09-18 LAB — ACETAMINOPHEN LEVEL: Acetaminophen (Tylenol), Serum: <10 ug/mL — ABNORMAL LOW (ref 10–30)

## 2018-09-18 LAB — SALICYLATE LEVEL: Salicylate Lvl: <7 mg/dL (ref 2.8–30.0)

## 2018-09-18 MED ORDER — ZIPRASIDONE MESYLATE 20 MG IM SOLR
20.0000 mg | Freq: Once | INTRAMUSCULAR | Status: AC
  Administered 2018-09-18: 20 mg via INTRAMUSCULAR

## 2018-09-18 MED ORDER — ACETAMINOPHEN 325 MG PO TABS
650.0000 mg | ORAL_TABLET | Freq: Once | ORAL | Status: AC
  Administered 2018-09-18: 650 mg via ORAL
  Filled 2018-09-18: qty 2

## 2018-09-18 MED ORDER — SODIUM CHLORIDE 0.9 % IV BOLUS
1000.0000 mL | Freq: Once | INTRAVENOUS | Status: AC
  Administered 2018-09-18: 1000 mL via INTRAVENOUS

## 2018-09-18 NOTE — ED Provider Notes (Signed)
The patient has been evaluated at bedside tele-psychiatry.  Patient is clinically stable.  Not felt to be a danger to self or others.  No SI or Hi.  No indication for inpatient psychiatric admission at this time.  Appropriate for continued outpatient therapy.    Willy Eddy, MD 09/18/18 [phone]

## 2018-09-18 NOTE — ED Notes (Signed)
Pt came into ED with one empty trazadone container, one fluoxetine 20 mg with four pills, one fluoxetine 20 mg x 44 pills, and one quetiapine fumarate 300 x 5 pills.

## 2018-09-18 NOTE — ED Provider Notes (Signed)
[facility] Emergency Department Provider Note   ____________________________________________   First MD Initiated Contact with Patient 09/18/18 0111     (approximate)  I have reviewed the triage vital signs and the nursing notes.   HISTORY  Chief Complaint Drug Overdose    HPI Tara Raymond is a 28 y.o. female brought to the ED from home via EMS status post intentional overdose.  Patient has a history of mood disorder, substance abuse who was discharged from behavioral health unit last week with prescriptions for trazodone, fluoxetine and quetiapine.  Patient reports she only took 1 or 2 pills of each "when it was still daylight".  Denies active SI but will not say why she took the pills.  Denies chest pain, shortness of breath, abdominal pain, nausea, vomiting or diarrhea.       Past Medical History:  Diagnosis Date  . Anemia   . Cocaine use disorder (HCC)   . Dyspnea    with pregnancy  . Mental disorder   . Opioid use disorder, moderate, dependence (HCC)   . Substance induced mood disorder [facility])     Patient Active Problem List   Diagnosis Date Noted  . Benzodiazepine abuse (HCC) 09/05/2018  . Urinary tract infection 09/05/2018  . Depression   . Opioid use disorder, severe, dependence (HCC)   . Opiate overdose (HCC) 09/01/2018  . Tobacco use disorder 07/09/2018  . Suicidal ideation 07/08/2018  . Overdose of benzodiazepine 07/08/2018  . Severe recurrent major depression with psychotic features (HCC) 07/08/2018  . Alcohol use disorder, moderate, dependence (HCC) 04/03/2018  . Substance abuse (HCC) 04/02/2018  . Encounter for maternal care for low transverse scar from previous cesarean delivery 04/20/2017  . Indication for care in labor or delivery 04/17/2017  . Anemia associated with acute blood loss 02/13/2016  . Postpartum care following cesarean delivery 02/12/2016  . Major depression, single episode 02/12/2016  . Postoperative  anemia due to acute blood loss 02/12/2016  . Adjustment disorder with depressed mood 02/11/2016  . Substance abuse affecting pregnancy in third trimester, antepartum 02/09/2016  . Labor and delivery, indication for care 02/03/2016  . Substance induced mood disorder (HCC) 01/28/2016  . Severe major depression, single episode, without psychotic features (HCC) 01/28/2016  . Cocaine use disorder, moderate, dependence (HCC) 01/28/2016  . Opioid use disorder, mild, in sustained remission, on maintenance therapy (HCC) 01/28/2016  . Opiate withdrawal (HCC) 01/28/2016    Past Surgical History:  Procedure Laterality Date  . CESAREAN SECTION N/A 02/09/2016   Procedure: CESAREAN SECTION;  Surgeon: Nadara Mustard, MD;  Location: [facility] ORS;  Service: Obstetrics;  Laterality: N/A;  . CESAREAN SECTION N/A 04/20/2017   Procedure: CESAREAN SECTION;  Surgeon: Vena Austria, MD;  Location: [facility] ORS;  Service: Obstetrics;  Laterality: N/A;    Prior to Admission medications   Medication Sig Start Date End Date Taking? Authorizing Provider  cephALEXin (KEFLEX) 500 MG capsule Take 1 capsule (500 mg total) by mouth every 12 (twelve) hours. 09/09/18   Clapacs, Jackquline Denmark, MD  FLUoxetine (PROZAC) 20 MG capsule Take 2 capsules (40 mg total) by mouth daily. 09/09/18   Clapacs, Jackquline Denmark, MD  methadone (DOLOPHINE) 10 MG tablet Take 80 mg by mouth daily.    [provider]  QUEtiapine (SEROQUEL) 300 MG tablet Take 1 tablet (300 mg total) by mouth at bedtime. 09/09/18   Clapacs, Jackquline Denmark, MD  traZODone (DESYREL) 100 MG tablet Take 1 tablet (100 mg total) by mouth at  bedtime as needed for sleep. 09/09/18   Clapacs, Jackquline Denmark, MD    Allergies Naproxen  Family History  Problem Relation Age of Onset  . Drug abuse Mother     Social History Social History   Tobacco Use  . Smoking status: Current Every Day Smoker    Packs/day: 0.50    Types: Cigarettes  . Smokeless tobacco: Current User  Substance Use Topics  .  Alcohol use: Yes  . Drug use: Yes    Frequency: 2.0 times per week    Types: Cocaine, Marijuana, Heroin    Comment: heroin    Review of Systems  Constitutional: No fever/chills Eyes: No visual changes. ENT: No sore throat. Cardiovascular: Denies chest pain. Respiratory: Denies shortness of breath. Gastrointestinal: No abdominal pain.  No nausea, no vomiting.  No diarrhea.  No constipation. Genitourinary: Negative for dysuria. Musculoskeletal: Negative for back pain. Skin: Negative for rash. Neurological: Negative for headaches, focal weakness or numbness. Psychiatric: Positive for intentional overdose.  ____________________________________________   PHYSICAL EXAM:  VITAL SIGNS: ED Triage Vitals  Enc Vitals Group     BP      Pulse      Resp      Temp      Temp src      SpO2      Weight      Height      Head Circumference      Peak Flow      Pain Score      Pain Loc      Pain Edu?      Excl. in GC?     Constitutional: Alert and oriented.  Disheveled appearing and in mild acute distress. Eyes: Conjunctivae are normal. PERRL. EOMI. Head: Atraumatic. Nose: No congestion/rhinnorhea. Mouth/Throat: Mucous membranes are moist.  Oropharynx non-erythematous. Neck: No stridor.   Cardiovascular: Normal rate, regular rhythm. Grossly normal heart sounds.  Good peripheral circulation. Respiratory: Normal respiratory effort.  No retractions. Lungs CTAB. Gastrointestinal: Soft and nontender. No distention. No abdominal bruits. No CVA tenderness. Musculoskeletal: No lower extremity tenderness nor edema.  No joint effusions. Neurologic:  Normal speech and language. No gross focal neurologic deficits are appreciated. No gait instability. Skin:  Skin is warm, dry and intact. No rash noted. Psychiatric: Mood and affect are flat. Speech and behavior are normal.  ____________________________________________   LABS (all labs ordered are listed, but only abnormal results are  displayed)  Labs Reviewed  CBC WITH DIFFERENTIAL/PLATELET - Abnormal; Notable for the following components:      Result Value   RBC 3.66 (*)    Hemoglobin 10.4 (*)    HCT 32.3 (*)    All other components within normal limits  COMPREHENSIVE METABOLIC PANEL - Abnormal; Notable for the following components:   Glucose, Bld 133 (*)    Calcium 8.5 (*)    Total Protein 6.4 (*)    Albumin 3.4 (*)    AST 51 (*)    ALT 60 (*)    All other components within normal limits  ACETAMINOPHEN LEVEL - Abnormal; Notable for the following components:   Acetaminophen (Tylenol), Serum <10 (*)    All other components within normal limits  ACETAMINOPHEN LEVEL - Abnormal; Notable for the following components:   Acetaminophen (Tylenol), Serum <10 (*)    All other components within normal limits  SALICYLATE LEVEL  ETHANOL  BLOOD GAS, ARTERIAL  SALICYLATE LEVEL  URINE DRUG SCREEN, QUALITATIVE ([facility] ONLY)  URINALYSIS, COMPLETE (UACMP) WITH MICROSCOPIC  POC  URINE PREG, ED   ____________________________________________  EKG  ED ECG REPORT I, Caedyn Raygoza J, the attending physician, personally viewed and interpreted this ECG.   Date: 09/18/2018  EKG Time: 0115  Rate: 119  Rhythm: sinus tachycardia  Axis: Normal  Intervals:none  ST&T Change: Nonspecific  ED ECG REPORT I, Lurae Hornbrook J, the attending physician, personally viewed and interpreted this ECG.   Date: 09/18/2018  EKG Time: 0357  Rate: 89  Rhythm: normal EKG, normal sinus rhythm  Axis: Normal  Intervals:none  ST&T Change: Nonspecific  ____________________________________________  RADIOLOGY  ED MD interpretation: No aspiration  Official radiology report(s): Dg Chest Port 1 View  Result Date: 09/18/2018 CLINICAL DATA:  Overdose. EXAM: PORTABLE CHEST 1 VIEW COMPARISON:  Chest radiograph 02/19/2016 FINDINGS: The cardiomediastinal contours are normal. Mild left basilar atelectasis. Chronic small pulmonary nodule at the right lung base,  unchanged from priors unlikely secondary to scarring. Pulmonary vasculature is normal. No consolidation, pleural effusion, or pneumothorax. No acute osseous abnormalities are seen. IMPRESSION: Mild left basilar atelectasis. Electronically Signed   By: Narda Rutherford M.D.   On: 09/18/2018 01:57    ____________________________________________   PROCEDURES  Procedure(s) performed (including Critical Care):  Procedures  CRITICAL CARE Performed by: Irean Hong   Total critical care time: 45 minutes  Critical care time was exclusive of separately billable procedures and treating other patients.  Critical care was necessary to treat or prevent imminent or life-threatening deterioration.  Critical care was time spent personally by me on the following activities: development of treatment plan with patient and/or surrogate as well as nursing, discussions with consultants, evaluation of patient's response to treatment, examination of patient, obtaining history from patient or surrogate, ordering and performing treatments and interventions, ordering and review of laboratory studies, ordering and review of radiographic studies, pulse oximetry and re-evaluation of patient's condition. ____________________________________________   INITIAL IMPRESSION / ASSESSMENT AND PLAN / ED COURSE  As part of my medical decision making, I reviewed the following data within the electronic MEDICAL RECORD NUMBER Nursing notes reviewed and incorporated, Labs reviewed, EKG interpreted, Old chart reviewed, Radiograph reviewed, A consult was requested and obtained from this/these consultant(s) Psychiatry and Notes from prior ED visits        28 year old female who presents status post intentional overdose.  Will obtain screening toxicological lab work and urinalysis.  Will call poison control.  Monitor in the ED and obtain psychiatric evaluation.  Patient under IVC for her safety.   Clinical Course as of Sep 17 708    Wynelle Link Sep 18, 2018  0323 Nurse spoke with poison control who recommends 6-hour observation and repeat EKG in 3 to 4 hours.   [JS]  0709 Patient is medically cleared at this time after 6-hour observation in the emergency department with repeat negative levels of acetaminophen and salicylate.  She will remain in the ED under IVC pending psychiatric evaluation and disposition.   [JS]    Clinical Course User Index [JS] Irean Hong, MD     ____________________________________________   FINAL CLINICAL IMPRESSION(S) / ED DIAGNOSES  Final diagnoses:  Intentional drug overdose, initial encounter [facility])     ED Discharge Orders    None       Note:  This document was prepared using Dragon voice recognition software and may include unintentional dictation errors.   Irean Hong, MD 09/18/18 [phone]

## 2018-09-18 NOTE — BH Assessment (Signed)
Assessment Note  Tara Raymond is an 28 y.o. female who presents to ED after being picked up from her house by EMS and transported to the ED. Upon arrival pt's mother gave pt's empty pill bottles to the EMS responders (newly filled prescription). When assessing pt she adamantly denies SI/HI. She denies taking an overdose of her medications. However, when the EMS retrieved her pill bottles her trazodone bottle was empty and 5 pills out of 30 of quetiapine. Pt was dc from behavioral health unit last week with prescriptions for trazadone, fluoxetine, and quetiapine. She reports she is unable to recall the events that happened prior to her arrival to the ED. She reports a constant "jerking" movement that she believes is caused by her new medications. Pt reported recent heroin and cannabis use (09/17/2018). She reports she used heroin "before" she went to pick-up her dose of methadone from [facility] - pt reports "I needed something to hold me over before I went to get my methadone". Pt recently discharged from [facility] BMU - 08/2018. Pt denied AVH. Pt was not observed responding to internal stimuli.  This Clinical research associate gathered collateral information from pt's mother Tara Raymond: [phone]). Pt's mother reports "Some medicine they keep giving her from that downstairs behavior place. She jerks and blacks out from that medicine and she can't remember why she fell. It makes her mouth do a weird movement."   Diagnosis: Substance induced mood disorder  Past Medical History:  Past Medical History:  Diagnosis Date  . Anemia   . Cocaine use disorder (HCC)   . Dyspnea    with pregnancy  . Mental disorder   . Opioid use disorder, moderate, dependence (HCC)   . Substance induced mood disorder [facility])     Past Surgical History:  Procedure Laterality Date  . CESAREAN SECTION N/A 02/09/2016   Procedure: CESAREAN SECTION;  Surgeon: Nadara Mustard, MD;  Location: [facility] ORS;  Service: Obstetrics;   Laterality: N/A;  . CESAREAN SECTION N/A 04/20/2017   Procedure: CESAREAN SECTION;  Surgeon: Vena Austria, MD;  Location: [facility] ORS;  Service: Obstetrics;  Laterality: N/A;    Family History:  Family History  Problem Relation Age of Onset  . Drug abuse Mother     Social History:  reports that she has been smoking cigarettes. She has been smoking about 0.50 packs per day. She uses smokeless tobacco. She reports current alcohol use. She reports current drug use. Frequency: 2.00 times per week. Drugs: Cocaine, Marijuana, and Heroin.  Additional Social History:  Alcohol / Drug Use Pain Medications: See PTA Prescriptions: See PTA Over the Counter: See PTA History of alcohol / drug use?: Yes Longest period of sobriety (when/how long): none noted Negative Consequences of Use: Personal relationships, Legal Withdrawal Symptoms: Agitation, Irritability, Aggressive/Assaultive, Other (Comment) Substance #1 Name of Substance 1: Opioids (methadone and heroine) 1 - Age of First Use: Unable to quantify 1 - Amount (size/oz): Unable to quantify 1 - Frequency: Daily 1 - Duration: Ongoing 1 - Last Use / Amount: 09/17/2018  CIWA: CIWA-Ar BP: (!) 123/98 Pulse Rate: 82 COWS:    Allergies:  Allergies  Allergen Reactions  . Naproxen Hives    Home Medications: (Not in a hospital admission)   OB/GYN Status:  Patient's last menstrual period was 08/22/2018.  General Assessment Data Location of Assessment: [facility] ED TTS Assessment: In system Is this a Tele or Face-to-Face Assessment?: Face-to-Face Is this an Initial Assessment or a Re-assessment for this encounter?: Initial Assessment Patient  Accompanied by:: N/A Language Other than English: No Living Arrangements: Other (Comment) What gender do you identify as?: Female Marital status: Single Maiden name: n/a Pregnancy Status: No Living Arrangements: Other (Comment)(Mother) Can pt return to current living arrangement?: Yes Admission  Status: Involuntary Petitioner: Police Is patient capable of signing voluntary admission?: No Referral Source: Self/Family/Friend Insurance type: None  Medical Screening Exam [facility] Walk-in ONLY) Medical Exam completed: Yes  Crisis Care Plan Living Arrangements: Other (Comment)(Mother) Legal Guardian: Other:(Self) Name of Psychiatrist: [facility] [place] Name of Therapist: [facility] [place]  Education Status Is patient currently in school?: No Is the patient employed, unemployed or receiving disability?: Unemployed  Risk to self with the past 6 months Suicidal Ideation: No Has patient been a risk to self within the past 6 months prior to admission? : Yes Suicidal Intent: No Has patient had any suicidal intent within the past 6 months prior to admission? : No Is patient at risk for suicide?: No Suicidal Plan?: No Has patient had any suicidal plan within the past 6 months prior to admission? : Yes Access to Means: No Specify Access to Suicidal Means: Overdose What has been your use of drugs/alcohol within the last 12 months?: Heroin, Cannabis Previous Attempts/Gestures: No How many times?: 0 Other Self Harm Risks: Active Subtance Use Triggers for Past Attempts: None known Intentional Self Injurious Behavior: None Family Suicide History: No Recent stressful life event(s): Other (Comment)(None noted by patient) Persecutory voices/beliefs?: No Depression: Yes Depression Symptoms: Feeling angry/irritable Substance abuse history and/or treatment for substance abuse?: Yes Suicide prevention information given to non-admitted patients: Not applicable  Risk to Others within the past 6 months Homicidal Ideation: No Does patient have any lifetime risk of violence toward others beyond the six months prior to admission? : No Thoughts of Harm to Others: No Current Homicidal Intent: No Current Homicidal Plan: No Access to Homicidal Means:  No Identified Victim: Patient denies History of harm to others?: No Assessment of Violence: None Noted Violent Behavior Description: None  Does patient have access to weapons?: No Criminal Charges Pending?: No Does patient have a court date: No Is patient on probation?: No  Psychosis Hallucinations: None noted Delusions: None noted  Mental Status Report Appearance/Hygiene: In scrubs Eye Contact: Fair Motor Activity: Unremarkable Speech: Logical/coherent Level of Consciousness: Alert Mood: Pleasant Affect: Flat Anxiety Level: Moderate Thought Processes: Coherent Judgement: Unimpaired Orientation: Person, Place, Time, Appropriate for developmental age, Situation Obsessive Compulsive Thoughts/Behaviors: Minimal  Cognitive Functioning Concentration: Normal Memory: Remote Intact, Recent Intact Is patient IDD: No Insight: Poor Impulse Control: Poor Appetite: Fair Have you had any weight changes? : No Change Sleep: Unable to Assess Total Hours of Sleep: (unable to assess) Vegetative Symptoms: None  ADLScreening [facility] Assessment Services) Patient's cognitive ability adequate to safely complete daily activities?: Yes Patient able to express need for assistance with ADLs?: Yes Independently performs ADLs?: Yes (appropriate for developmental age)  Prior Inpatient Therapy Prior Inpatient Therapy: Yes Prior Therapy Dates: (06/2018; 08/2018) Prior Therapy Facilty/Provider(s): [facility] BMU Reason for Treatment: Depression  Prior Outpatient Therapy Prior Outpatient Therapy: Yes Prior Therapy Dates: Current [facility] Therapy [facility] Reason for Treatment: Methadone Treatment Does patient have an ACCT team?: No Does patient have Intensive In-House Services?  : No Does patient have [facility] services? : No Does patient have P4CC services?: No  ADL Screening (condition at time of admission) Patient's cognitive ability adequate to safely complete daily  activities?: Yes Patient able to express need for  assistance with ADLs?: Yes Independently performs ADLs?: Yes (appropriate for developmental age)       Abuse/Neglect Assessment (Assessment to be complete while patient is alone) Abuse/Neglect Assessment Can Be Completed: Yes Physical Abuse: Denies Verbal Abuse: Denies Sexual Abuse: Denies Exploitation of patient/patient's resources: Denies Self-Neglect: Denies Values / Beliefs Cultural Requests During Hospitalization: None Spiritual Requests During Hospitalization: None Consults Spiritual Care Consult Needed: No Social Work Consult Needed: No Merchant navy officer (For Healthcare) Does Patient Have a Medical Advance Directive?: No Would patient like information on creating a medical advance directive?: No - Patient declined       Child/Adolescent Assessment Running Away Risk: (Patient is an adult)  Disposition:  Disposition Initial Assessment Completed for this Encounter: Yes Disposition of Patient: (Pending psych consult)  On Site Evaluation by:   Reviewed with Physician:    Wilmon Arms 09/18/2018 10:52 AM

## 2018-09-18 NOTE — ED Notes (Signed)
Pt denies SI, HI, and A/V hallucinations.  Contracts for safety.  Pt verbalized understanding of discharge instructions.  Pt discharged to lobby to await ride from family.

## 2018-09-18 NOTE — ED Notes (Signed)
Medications locked up with pharmacy at this time.

## 2018-09-18 NOTE — ED Triage Notes (Signed)
Pt to ED via ems from home with overdose. Pt was dc from behavioral health unit last week with prescriptions for trazadone, fluoxetine, and quetiapine. Pt arrives stating she only took 1-2 pills of each but has an empty bottle of trazadone, and 5 pills out of 30 of quetiapine. Pt denies SI/HI at this time and will not give reasoning for taking pills.

## 2018-09-18 NOTE — ED Notes (Signed)
Per MD Dolores Frame, pt does not need to be woken up for urine at this time.

## 2018-09-18 NOTE — BH Assessment (Addendum)
TTS contacted pt's mother Raegyn Bonson: [phone]) who reports she will p/u pt at the time of discharge.  Per Dr. Roxan Hockey, pt will not be discharged until after 3:30p.  Pt's mother has been notified.

## 2018-09-18 NOTE — ED Notes (Signed)
Poison control updated with pt's progress.

## 2018-09-18 NOTE — ED Notes (Signed)
Pt given phone to call her mom.  

## 2018-12-29 IMAGING — CT CT ABD-PELV W/ CM
2 of 5 series · 16 of 46 positions shown, 18 images · IV contrast (iopamidol)
Comparison: None.

CLINICAL DATA: Cesarean section 10 days ago with increased pain and
swelling at the surgical excision, initial encounter

EXAM:
CT ABDOMEN AND PELVIS WITH CONTRAST
TECHNIQUE: Multidetector CT imaging of the abdomen and pelvis was performed
using the standard protocol following bolus administration of
intravenous contrast.
CONTRAST:  75mL HGVUPY-B11 IOPAMIDOL (HGVUPY-B11) INJECTION 61%

[Series 2: routine abd/pel with · axial · 0.65mm/px · z∈[-237,+123]mm · 13 of 86 slices shown, 15 images]
[im 7/86  soft-tissue]
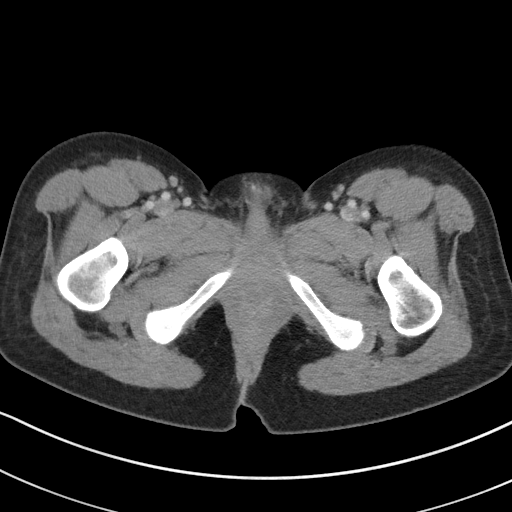
[im 7/86  bone]
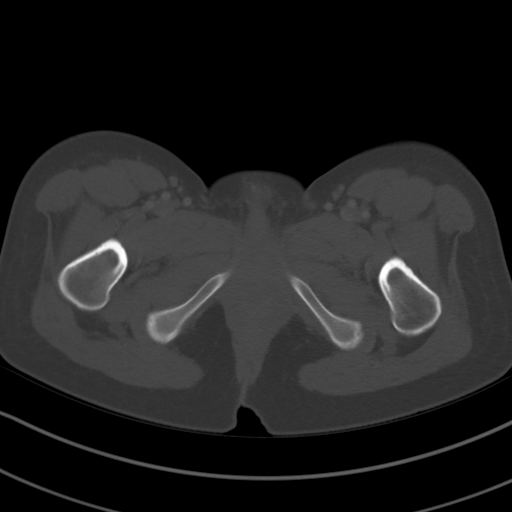
[im 13/86  soft-tissue]
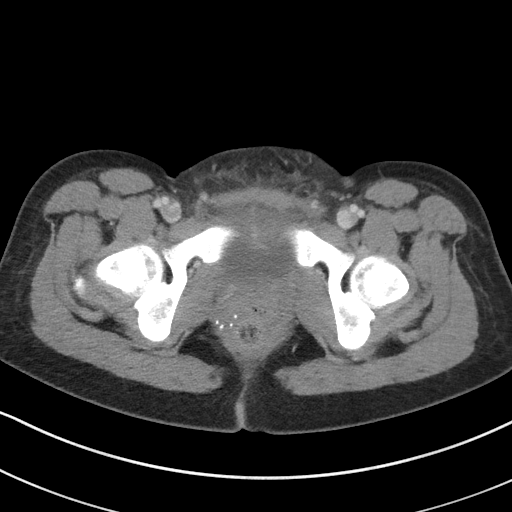
[im 19/86  soft-tissue]
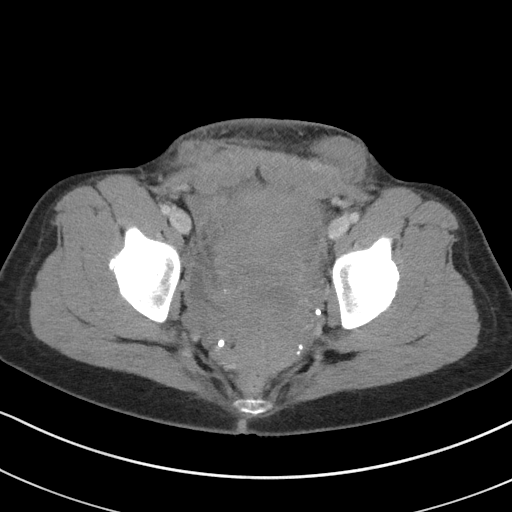
[im 25/86  soft-tissue]
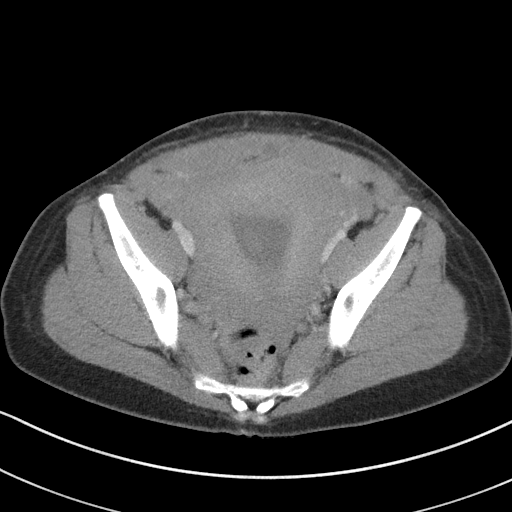
[im 31/86  soft-tissue]
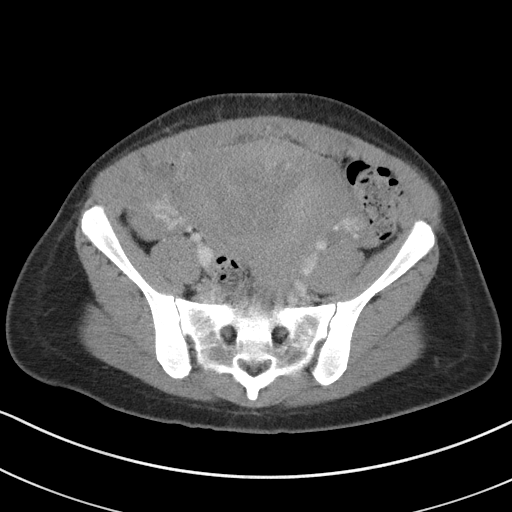
[im 37/86  soft-tissue]
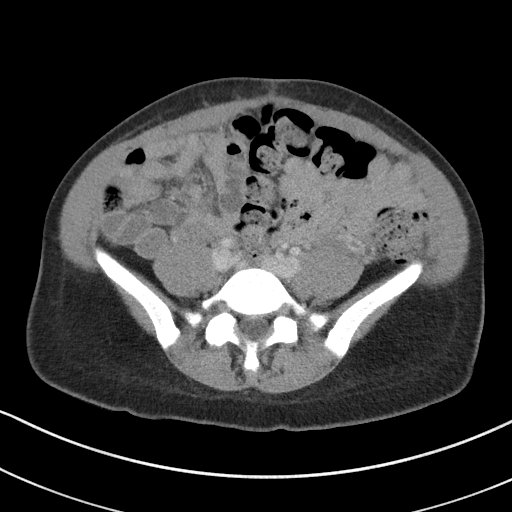
[im 43/86  soft-tissue]
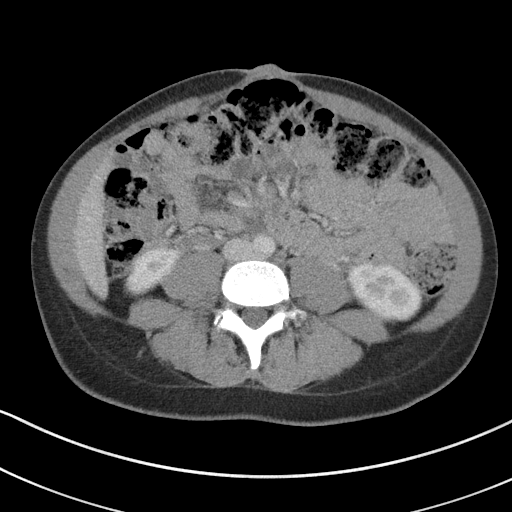
[im 49/86  soft-tissue]
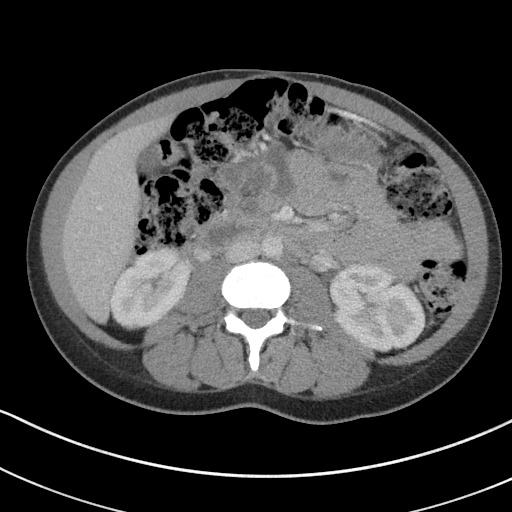
[im 55/86  soft-tissue]
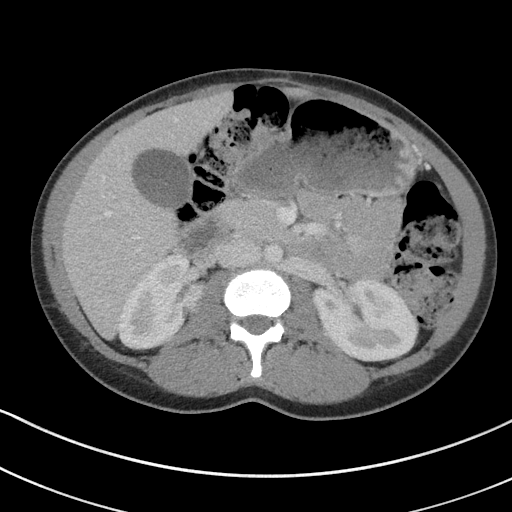
[im 55/86  bone]
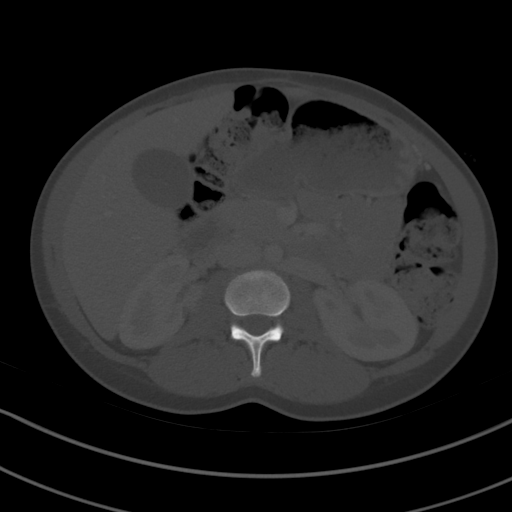
[im 61/86  soft-tissue]
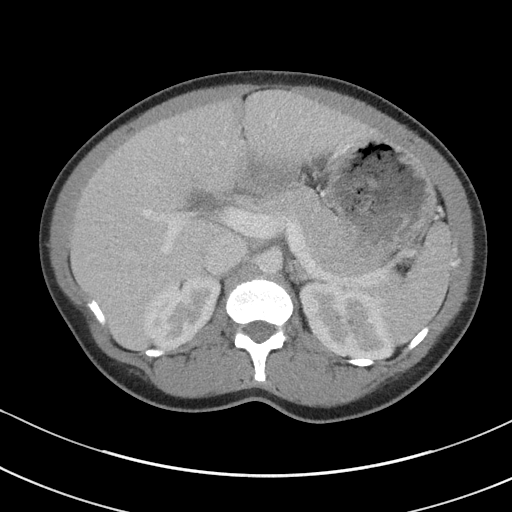
[im 67/86  soft-tissue]
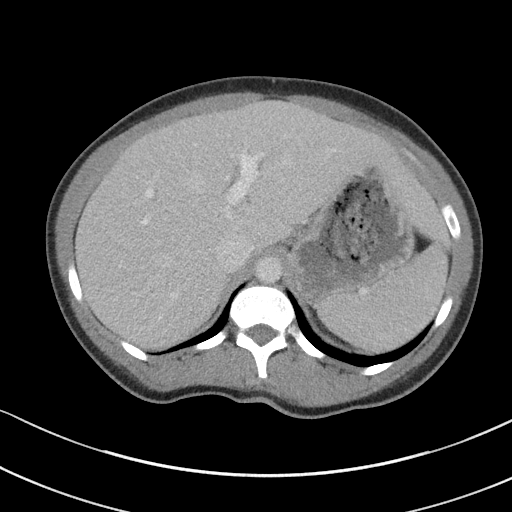
[im 73/86  soft-tissue]
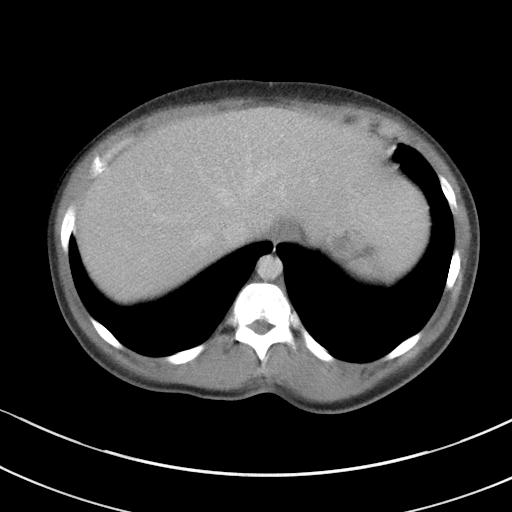
[im 79/86  soft-tissue]
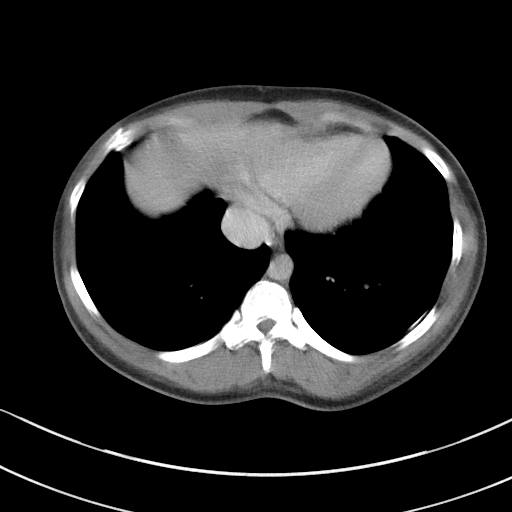

[Series 5: coronal st · coronal · 0.68mm/px · 3 of 75 slices shown]
[im 25/75  soft-tissue]
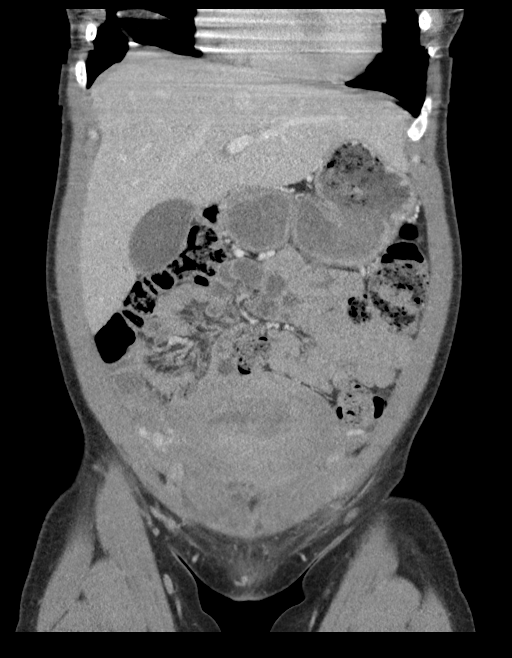
[im 33/75  soft-tissue]
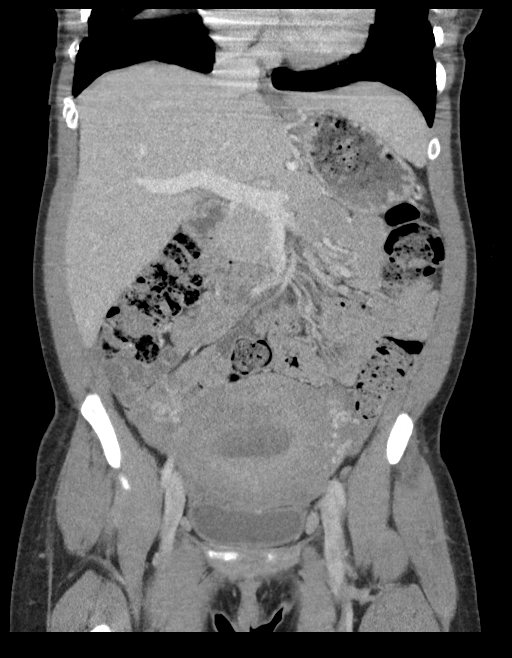
[im 42/75  soft-tissue]
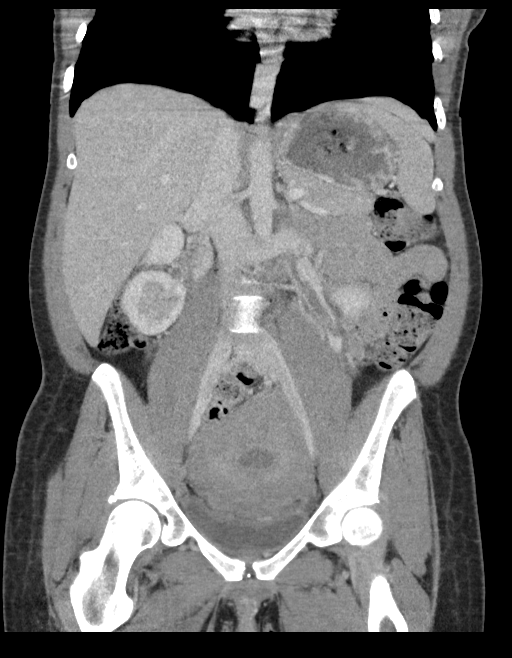

[16 of 46 positions shown; findings below may reference images not displayed]

FINDINGS: Lower chest: No acute abnormality.

Hepatobiliary: No focal liver abnormality is seen. No gallstones,
gallbladder wall thickening, or biliary dilatation.

Pancreas: Unremarkable. No pancreatic ductal dilatation or
surrounding inflammatory changes.

Spleen: Normal in size without focal abnormality.

Adrenals/Urinary Tract: Adrenal glands are unremarkable. Kidneys are
normal, without renal calculi, focal lesion, or hydronephrosis.
Bladder is unremarkable.

Stomach/Bowel: No obstructive or inflammatory changes are seen. The
appendix is within normal limits.

Vascular/Lymphatic: Retroaortic left renal vein is noted. No
lymphadenopathy is noted.

Reproductive: The uterus is bulky with fluid within the endometrial
canal consistent with the postpartum state. Postsurgical changes are
noted consistent with the recent surgery. There is a fluid
collection somewhat ovoid in dimensions measuring approximately 12
cm in greatest transverse dimension. It measures approximately
cm in greatest AP dimension. This is consistent with a postoperative
hematoma. No areas noted within to suggest developing abscess.
Decompression may be warranted.

Other: No free fluid is noted.  No hernia is seen.

Musculoskeletal: No acute bony abnormality is noted.
IMPRESSION: Somewhat complex fluid collection in the subcutaneous tissues of the
anterior abdominal wall consistent with the recent surgery. This
likely represents a postoperative hematoma. The possibility of
superinfection cannot be totally excluded although no air is noted
within. Decompression may be helpful. No active extravasation is
identified.

Bulky uterus consistent with postpartum state.

No other focal abnormality is noted.

## 2019-03-07 ENCOUNTER — Ambulatory Visit: Payer: Self-pay

## 2019-07-24 ENCOUNTER — Ambulatory Visit: Payer: Medicaid Other

## 2019-09-14 ENCOUNTER — Other Ambulatory Visit: Payer: Self-pay

## 2019-09-14 ENCOUNTER — Emergency Department
Admission: EM | Admit: 2019-09-14 | Discharge: 2019-09-14 | Disposition: A | Discharge status: Home / Self Care | Payer: Medicaid Other | Attending: Emergency Medicine | Admitting: Emergency Medicine

## 2019-09-14 DIAGNOSIS — F1193 Opioid use, unspecified with withdrawal: Secondary | ICD-10-CM

## 2019-09-14 DIAGNOSIS — Y900 Blood alcohol level of less than 20 mg/100 ml: Secondary | ICD-10-CM | POA: Insufficient documentation

## 2019-09-14 DIAGNOSIS — F10230 Alcohol dependence with withdrawal, uncomplicated: Secondary | ICD-10-CM | POA: Insufficient documentation

## 2019-09-14 DIAGNOSIS — R456 Violent behavior: Secondary | ICD-10-CM | POA: Insufficient documentation

## 2019-09-14 DIAGNOSIS — F17210 Nicotine dependence, cigarettes, uncomplicated: Secondary | ICD-10-CM | POA: Insufficient documentation

## 2019-09-14 DIAGNOSIS — Z79899 Other long term (current) drug therapy: Secondary | ICD-10-CM | POA: Insufficient documentation

## 2019-09-14 DIAGNOSIS — F1123 Opioid dependence with withdrawal: Secondary | ICD-10-CM | POA: Insufficient documentation

## 2019-09-14 DIAGNOSIS — F10930 Alcohol use, unspecified with withdrawal, uncomplicated: Secondary | ICD-10-CM

## 2019-09-14 LAB — SALICYLATE LEVEL: Salicylate Lvl: <7 mg/dL — ABNORMAL LOW (ref 7.0–30.0)

## 2019-09-14 LAB — URINE DRUG SCREEN, QUALITATIVE (ARMC ONLY)
Amphetamines, Ur Screen: NOT DETECTED
Barbiturates, Ur Screen: NOT DETECTED
Benzodiazepine, Ur Scrn: NOT DETECTED
Cannabinoid 50 Ng, Ur ~~LOC~~: POSITIVE — AB
Cocaine Metabolite,Ur ~~LOC~~: NOT DETECTED
MDMA (Ecstasy)Ur Screen: NOT DETECTED
Methadone Scn, Ur: NOT DETECTED
Opiate, Ur Screen: POSITIVE — AB
Phencyclidine (PCP) Ur S: NOT DETECTED
Tricyclic, Ur Screen: NOT DETECTED

## 2019-09-14 LAB — COMPREHENSIVE METABOLIC PANEL
ALT: 24 U/L (ref 0–44)
AST: 32 U/L (ref 15–41)
Albumin: 4.3 g/dL (ref 3.5–5.0)
Alkaline Phosphatase: 43 U/L (ref 38–126)
Anion gap: 9 (ref 5–15)
BUN: 13 mg/dL (ref 6–20)
CO2: 22 mmol/L (ref 22–32)
Calcium: 9.1 mg/dL (ref 8.9–10.3)
Chloride: 104 mmol/L (ref 98–111)
Creatinine, Ser: 0.55 mg/dL (ref 0.44–1.00)
GFR calc Af Amer: >60 mL/min (ref >60)
GFR calc non Af Amer: >60 mL/min (ref >60)
Glucose, Bld: 75 mg/dL (ref 70–99)
Potassium: 3.5 mmol/L (ref 3.5–5.1)
Sodium: 135 mmol/L (ref 135–145)
Total Bilirubin: 0.3 mg/dL (ref 0.3–1.2)
Total Protein: 7.6 g/dL (ref 6.5–8.1)

## 2019-09-14 LAB — CBC
HCT: 36.8 % (ref 36.0–46.0)
Hemoglobin: 12.2 g/dL (ref 12.0–15.0)
MCH: 29.1 pg (ref 26.0–34.0)
MCHC: 33.2 g/dL (ref 30.0–36.0)
MCV: 87.8 fL (ref 80.0–100.0)
Platelets: 207 10*3/uL (ref 150–400)
RBC: 4.19 MIL/uL (ref 3.87–5.11)
RDW: 13.2 % (ref 11.5–15.5)
WBC: 7.2 10*3/uL (ref 4.0–10.5)
nRBC: 0 % (ref 0.0–0.2)

## 2019-09-14 LAB — ETHANOL: Alcohol, Ethyl (B): <10 mg/dL (ref <10)

## 2019-09-14 LAB — PREGNANCY, URINE: Preg Test, Ur: NEGATIVE

## 2019-09-14 LAB — ACETAMINOPHEN LEVEL: Acetaminophen (Tylenol), Serum: <10 ug/mL — ABNORMAL LOW (ref 10–30)

## 2019-09-14 MED ORDER — THIAMINE HCL 100 MG/ML IJ SOLN: 100.0000 mg | Freq: Every day | INTRAMUSCULAR | Status: DC

## 2019-09-14 MED ORDER — LORAZEPAM 1 MG PO TABS
1.0000 mg | ORAL_TABLET | Freq: Once | ORAL | Status: AC
  Administered 2019-09-14: 18:00:00 1 mg via ORAL
  Filled 2019-09-14: qty 1

## 2019-09-14 MED ORDER — ONDANSETRON 4 MG PO TBDP
4.0000 mg | ORAL_TABLET | Freq: Once | ORAL | Status: AC
  Administered 2019-09-14: 4 mg via ORAL
  Filled 2019-09-14: qty 1

## 2019-09-14 MED ORDER — LORAZEPAM 2 MG PO TABS: 0.0000 mg | ORAL_TABLET | Freq: Four times a day (QID) | ORAL | Status: DC

## 2019-09-14 MED ORDER — THIAMINE HCL 100 MG PO TABS
100.0000 mg | ORAL_TABLET | Freq: Every day | ORAL | Status: DC
  Administered 2019-09-14: 18:00:00 100 mg via ORAL
  Filled 2019-09-14: qty 1

## 2019-09-14 MED ORDER — LORAZEPAM 2 MG/ML IJ SOLN: 0.0000 mg | Freq: Two times a day (BID) | INTRAMUSCULAR | Status: DC

## 2019-09-14 MED ORDER — LORAZEPAM 2 MG PO TABS: 0.0000 mg | ORAL_TABLET | Freq: Two times a day (BID) | ORAL | Status: DC

## 2019-09-14 MED ORDER — LORAZEPAM 2 MG/ML IJ SOLN: 0.0000 mg | Freq: Four times a day (QID) | INTRAMUSCULAR | Status: DC

## 2019-09-14 MED ORDER — CLONIDINE HCL 0.1 MG PO TABS
0.1000 mg | ORAL_TABLET | Freq: Once | ORAL | Status: AC
  Administered 2019-09-14: 17:00:00 0.1 mg via ORAL
  Filled 2019-09-14: qty 1

## 2019-09-14 NOTE — ED Notes (Signed)
Patient yelling in hallway asking where her mother is. This RN to bedside. This RN explained to patient that her mother (also a patient) had been moved to another area. Patient asking about her mother's status. Patient informed both of HIPPA privacy laws, as well as that this RN was not primary RN for her mother and therefore had no information on her. Patient became irate and verbally aggressive towards staff. Patient demanding to leave AMA. MD informed. MD approved.   Patient threw her meal tray across room towards [employer] officer. [employer] officer and security at patient's bedside.  Patient given belongings. Patient changed. Patient walked to lobby by [employer] officer while Engineer, materials stayed in [place] area.

## 2019-09-14 NOTE — ED Notes (Signed)
Patient pacing in hall "stating I want to leave, I'm ready to leave now". Md aware.

## 2019-09-14 NOTE — ED Notes (Signed)
VOL, pend consult 

## 2019-09-14 NOTE — ED Triage Notes (Signed)
Pt states she has been talking to her advocate and told her that she feels like she wants to take all of her meds and finish herself off. Advocate told her to come to the hospital.

## 2019-09-14 NOTE — ED Provider Notes (Signed)
[facility] Emergency Department Provider Note   ____________________________________________   First MD Initiated Contact with Patient 09/14/19 1638     (approximate)  I have reviewed the triage vital signs and the nursing notes.   HISTORY  Chief Complaint Opiate withdrawal   HPI Tara Raymond is a 29 y.o. female with past medical history of polysubstance abuse who presents to the ED complaining of withdrawal symptoms.  Patient reports that she has been snorting heroin regularly and also drinks alcohol on a daily basis.  She last used heroin earlier this morning and also last drink earlier this morning.  She reports drinking about 1/5/day of liquor and is now developing withdrawal symptoms.  She reports feeling nauseous and jittery.  She had initially reported thoughts of harming herself, but now adamantly denies this and states she is only looking for help with detox.        Past Medical History:  Diagnosis Date  . Anemia   . Cocaine use disorder (HCC)   . Dyspnea    with pregnancy  . Mental disorder   . Opioid use disorder, moderate, dependence (HCC)   . Substance induced mood disorder [facility])     Patient Active Problem List   Diagnosis Date Noted  . Benzodiazepine abuse (HCC) 09/05/2018  . Urinary tract infection 09/05/2018  . Depression   . Opioid use disorder, severe, dependence (HCC)   . Opiate overdose (HCC) 09/01/2018  . Tobacco use disorder 07/09/2018  . Suicidal ideation 07/08/2018  . Overdose of benzodiazepine 07/08/2018  . Severe recurrent major depression with psychotic features (HCC) 07/08/2018  . Alcohol use disorder, moderate, dependence (HCC) 04/03/2018  . Substance abuse (HCC) 04/02/2018  . Encounter for maternal care for low transverse scar from previous cesarean delivery 04/20/2017  . Indication for care in labor or delivery 04/17/2017  . Anemia associated with acute blood loss 02/13/2016  . Postpartum care  following cesarean delivery 02/12/2016  . Major depression, single episode 02/12/2016  . Postoperative anemia due to acute blood loss 02/12/2016  . Adjustment disorder with depressed mood 02/11/2016  . Substance abuse affecting pregnancy in third trimester, antepartum 02/09/2016  . Labor and delivery, indication for care 02/03/2016  . Substance induced mood disorder (HCC) 01/28/2016  . Severe major depression, single episode, without psychotic features (HCC) 01/28/2016  . Cocaine use disorder, moderate, dependence (HCC) 01/28/2016  . Opioid use disorder, mild, in sustained remission, on maintenance therapy (HCC) 01/28/2016  . Opiate withdrawal (HCC) 01/28/2016    Past Surgical History:  Procedure Laterality Date  . CESAREAN SECTION N/A 02/09/2016   Procedure: CESAREAN SECTION;  Surgeon: Nadara Mustard, MD;  Location: [facility] ORS;  Service: Obstetrics;  Laterality: N/A;  . CESAREAN SECTION N/A 04/20/2017   Procedure: CESAREAN SECTION;  Surgeon: Vena Austria, MD;  Location: [facility] ORS;  Service: Obstetrics;  Laterality: N/A;    Prior to Admission medications   Medication Sig Start Date End Date Taking? Authorizing Provider  cephALEXin (KEFLEX) 500 MG capsule Take 1 capsule (500 mg total) by mouth every 12 (twelve) hours. 09/09/18   Clapacs, Jackquline Denmark, MD  FLUoxetine (PROZAC) 20 MG capsule Take 2 capsules (40 mg total) by mouth daily. 09/09/18   Clapacs, Jackquline Denmark, MD  methadone (DOLOPHINE) 10 MG tablet Take 80 mg by mouth daily.    [provider]  QUEtiapine (SEROQUEL) 300 MG tablet Take 1 tablet (300 mg total) by mouth at bedtime. 09/09/18   Clapacs, Jackquline Denmark, MD  traZODone (  DESYREL) 100 MG tablet Take 1 tablet (100 mg total) by mouth at bedtime as needed for sleep. 09/09/18   Clapacs, Jackquline Denmark, MD    Allergies Naproxen  Family History  Problem Relation Age of Onset  . Drug abuse Mother     Social History Social History   Tobacco Use  . Smoking status: Current Every Day Smoker     Packs/day: 0.50    Types: Cigarettes  . Smokeless tobacco: Current User  Substance Use Topics  . Alcohol use: Yes  . Drug use: Yes    Frequency: 2.0 times per week    Types: Cocaine, Marijuana, Heroin    Comment: heroin    Review of Systems  Constitutional: No fever/chills Eyes: No visual changes. ENT: No sore throat. Cardiovascular: Denies chest pain. Respiratory: Denies shortness of breath. Gastrointestinal: No abdominal pain.  Positive for nausea, no vomiting.  No diarrhea.  No constipation. Genitourinary: Negative for dysuria. Musculoskeletal: Negative for back pain. Skin: Negative for rash. Neurological: Negative for headaches, focal weakness or numbness.  ____________________________________________   PHYSICAL EXAM:  VITAL SIGNS: ED Triage Vitals  Enc Vitals Group     BP 09/14/19 1549 101/84     Pulse Rate 09/14/19 1549 84     Resp 09/14/19 1549 16     Temp 09/14/19 1549 98.8 F (37.1 C)     Temp Source 09/14/19 1549 Oral     SpO2 09/14/19 1549 100 %     Weight 09/14/19 1552 120 lb (54.4 kg)     Height 09/14/19 1552 4\' 9"  (1.448 m)     Head Circumference --      Peak Flow --      Pain Score 09/14/19 1551 8     Pain Loc --      Pain Edu? --      Excl. in GC? --     Constitutional: Alert and oriented. Eyes: Conjunctivae are normal. Head: Atraumatic. Nose: No congestion/rhinnorhea. Mouth/Throat: Mucous membranes are moist. Neck: Normal ROM Cardiovascular: Normal rate, regular rhythm. Grossly normal heart sounds. Respiratory: Normal respiratory effort.  No retractions. Lungs CTAB. Gastrointestinal: Soft and nontender. No distention. Genitourinary: deferred Musculoskeletal: No lower extremity tenderness nor edema. Neurologic:  Normal speech and language. No gross focal neurologic deficits are appreciated. Skin:  Skin is warm, dry and intact. No rash noted. Psychiatric: Mood and affect are normal. Speech and behavior are  normal.  ____________________________________________   LABS (all labs ordered are listed, but only abnormal results are displayed)  Labs Reviewed  SALICYLATE LEVEL - Abnormal; Notable for the following components:      Result Value   Salicylate Lvl <7.0 (*)    All other components within normal limits  ACETAMINOPHEN LEVEL - Abnormal; Notable for the following components:   Acetaminophen (Tylenol), Serum <10 (*)    All other components within normal limits  URINE DRUG SCREEN, QUALITATIVE ([facility] ONLY) - Abnormal; Notable for the following components:   Opiate, Ur Screen POSITIVE (*)    Cannabinoid 50 Ng, Ur [place] POSITIVE (*)    All other components within normal limits  COMPREHENSIVE METABOLIC PANEL  ETHANOL  CBC  PREGNANCY, URINE    PROCEDURES  Procedure(s) performed (including Critical Care):  Procedures   ____________________________________________   INITIAL IMPRESSION / ASSESSMENT AND PLAN / ED COURSE       29 year old female presents to the ED requesting detox from both heroin and alcohol.  She is hyperactive here in the ED but appears otherwise well, was  given clonidine, Zofran, and Ativan to assist with her withdrawal symptoms.  We will also place her on CIWA protocol.  Plan to consult TTS for potential detox, but as she adamantly denies SI or HI at this time, no indication to place patient under IVC or have psychiatry evaluate.  Upon finding out that her mother, who is also a patient, was moved to a different area, patient became very upset and demanded to leave.  She has requested to leave AMA, there is no evidence of life-threatening withdrawal at this time.  She became increasingly aggressive and had to be escorted out by security and police.      ____________________________________________   FINAL CLINICAL IMPRESSION(S) / ED DIAGNOSES  Final diagnoses:  Heroin withdrawal ([place])  Alcohol withdrawal syndrome without complication [facility])     ED Discharge  Orders    None       Note:  This document was prepared using Dragon voice recognition software and may include unintentional dictation errors.   Blake Divine, MD 09/14/19 2356

## 2019-09-14 NOTE — ED Notes (Signed)
Patient to ed voluntarily reports going through withdrawals. Wants to stop using heroin. Patient reports last time use was this morning. Patient anxious and tearful, reports she is feeling really sick, nausea and abd cramping. vss at present time. Ed consult completed, psych to eval patient. Patient awaiting plan of care for detox.

## 2019-09-14 NOTE — ED Notes (Signed)
Pt mother has also checked in as Beh. She is in the sub wait at this time waiting on a room. Pt verbalized that mom may be included in her care.

## 2019-11-15 ENCOUNTER — Encounter: Payer: Self-pay | Admitting: Emergency Medicine

## 2019-11-15 ENCOUNTER — Emergency Department
Admission: EM | Admit: 2019-11-15 | Discharge: 2019-11-15 | Disposition: A | Discharge status: Home / Self Care | Payer: Medicaid Other | Attending: Emergency Medicine | Admitting: Emergency Medicine

## 2019-11-15 DIAGNOSIS — Z20822 Contact with and (suspected) exposure to covid-19: Secondary | ICD-10-CM | POA: Insufficient documentation

## 2019-11-15 DIAGNOSIS — Z79899 Other long term (current) drug therapy: Secondary | ICD-10-CM | POA: Insufficient documentation

## 2019-11-15 DIAGNOSIS — F1123 Opioid dependence with withdrawal: Secondary | ICD-10-CM

## 2019-11-15 DIAGNOSIS — F1111 Opioid abuse, in remission: Secondary | ICD-10-CM | POA: Diagnosis present

## 2019-11-15 DIAGNOSIS — F329 Major depressive disorder, single episode, unspecified: Secondary | ICD-10-CM | POA: Insufficient documentation

## 2019-11-15 DIAGNOSIS — F17210 Nicotine dependence, cigarettes, uncomplicated: Secondary | ICD-10-CM | POA: Insufficient documentation

## 2019-11-15 DIAGNOSIS — F1193 Opioid use, unspecified with withdrawal: Secondary | ICD-10-CM

## 2019-11-15 LAB — COMPREHENSIVE METABOLIC PANEL
ALT: 18 U/L (ref 0–44)
AST: 26 U/L (ref 15–41)
Albumin: 4.1 g/dL (ref 3.5–5.0)
Alkaline Phosphatase: 45 U/L (ref 38–126)
Anion gap: 6 (ref 5–15)
BUN: 15 mg/dL (ref 6–20)
CO2: 24 mmol/L (ref 22–32)
Calcium: 8.9 mg/dL (ref 8.9–10.3)
Chloride: 101 mmol/L (ref 98–111)
Creatinine, Ser: 0.64 mg/dL (ref 0.44–1.00)
GFR calc Af Amer: >60 mL/min (ref >60)
GFR calc non Af Amer: >60 mL/min (ref >60)
Glucose, Bld: 106 mg/dL — ABNORMAL HIGH (ref 70–99)
Potassium: 3.2 mmol/L — ABNORMAL LOW (ref 3.5–5.1)
Sodium: 131 mmol/L — ABNORMAL LOW (ref 135–145)
Total Bilirubin: 0.5 mg/dL (ref 0.3–1.2)
Total Protein: 7.8 g/dL (ref 6.5–8.1)

## 2019-11-15 LAB — CBC
HCT: 38.6 % (ref 36.0–46.0)
Hemoglobin: 12.7 g/dL (ref 12.0–15.0)
MCH: 29.1 pg (ref 26.0–34.0)
MCHC: 32.9 g/dL (ref 30.0–36.0)
MCV: 88.3 fL (ref 80.0–100.0)
Platelets: 248 10*3/uL (ref 150–400)
RBC: 4.37 MIL/uL (ref 3.87–5.11)
RDW: 13.7 % (ref 11.5–15.5)
WBC: 6.7 10*3/uL (ref 4.0–10.5)
nRBC: 0 % (ref 0.0–0.2)

## 2019-11-15 LAB — SALICYLATE LEVEL: Salicylate Lvl: <7 mg/dL — ABNORMAL LOW (ref 7.0–30.0)

## 2019-11-15 LAB — ACETAMINOPHEN LEVEL
Acetaminophen (Tylenol), Serum: <10 ug/mL — ABNORMAL LOW (ref 10–30)
Acetaminophen (Tylenol), Serum: <10 ug/mL — ABNORMAL LOW (ref 10–30)

## 2019-11-15 LAB — RESPIRATORY PANEL BY RT PCR (FLU A&B, COVID)
Influenza A by PCR: NEGATIVE
Influenza B by PCR: NEGATIVE
SARS Coronavirus 2 by RT PCR: NEGATIVE

## 2019-11-15 LAB — ETHANOL: Alcohol, Ethyl (B): <10 mg/dL (ref <10)

## 2019-11-15 MED ORDER — ONDANSETRON HCL 4 MG/2ML IJ SOLN
INTRAMUSCULAR | Status: AC
  Administered 2019-11-15: 03:00:00 4 mg via INTRAVENOUS
  Filled 2019-11-15: qty 2

## 2019-11-15 MED ORDER — BUPRENORPHINE HCL-NALOXONE HCL 8-2 MG SL SUBL: 1.0000 | SUBLINGUAL_TABLET | Freq: Every day | SUBLINGUAL | Status: DC

## 2019-11-15 MED ORDER — BUPRENORPHINE HCL-NALOXONE HCL 8-2 MG SL SUBL
1.0000 | SUBLINGUAL_TABLET | Freq: Every day | SUBLINGUAL | Status: DC
  Administered 2019-11-15: 1 via SUBLINGUAL
  Filled 2019-11-15 (×2): qty 1

## 2019-11-15 MED ORDER — LIDOCAINE VISCOUS HCL 2 % MT SOLN
15.0000 mL | Freq: Once | OROMUCOSAL | Status: AC
  Administered 2019-11-15: 03:00:00 15 mL via OROMUCOSAL
  Filled 2019-11-15: qty 15

## 2019-11-15 MED ORDER — SODIUM CHLORIDE 0.9 % IV BOLUS
1000.0000 mL | Freq: Once | INTRAVENOUS | Status: AC
  Administered 2019-11-15: 03:00:00 1000 mL via INTRAVENOUS

## 2019-11-15 MED ORDER — ONDANSETRON HCL 4 MG/2ML IJ SOLN: 4.0000 mg | Freq: Once | INTRAMUSCULAR | Status: AC

## 2019-11-15 MED ORDER — ONDANSETRON HCL 4 MG/2ML IJ SOLN
INTRAMUSCULAR | Status: AC
  Administered 2019-11-15: 04:00:00 4 mg via INTRAVENOUS
  Filled 2019-11-15: qty 2

## 2019-11-15 NOTE — ED Provider Notes (Signed)
[facility] Emergency Department Provider Note  ____________________________________________   First MD Initiated Contact with Patient 11/15/19 0245     (approximate)  I have reviewed the triage vital signs and the nursing notes.   HISTORY   Chief Complaint Drug Overdose   HPI Tara Raymond is a 29 y.o. female with below list of previous medical conditions presents to the emergency department via EMS stating that she took 24 Tylenol 500 mg tablets and 4 Tylenol PM tablets over the course of the last 14 hours secondary to dental pain.  Patient admits to nausea and abdominal cramping.  Patient states that she last used heroin 2 hours before arrival.  Patient states that she took the Tylenol to relieve her discomfort and that this was not a suicide attempt.  Patient very restless with bizarre behavior on arrival to the emergency department        Past Medical History:  Diagnosis Date   Anemia    Cocaine use disorder (HCC)    Dyspnea    with pregnancy   Mental disorder    Opioid use disorder, moderate, dependence (HCC)    Substance induced mood disorder (HCC)     Patient Active Problem List   Diagnosis Date Noted   Benzodiazepine abuse (HCC) 09/05/2018   Urinary tract infection 09/05/2018   Depression    Opioid use disorder, severe, dependence (HCC)    Opiate overdose (HCC) 09/01/2018   Tobacco use disorder 07/09/2018   Suicidal ideation 07/08/2018   Overdose of benzodiazepine 07/08/2018   Severe recurrent major depression with psychotic features (HCC) 07/08/2018   Alcohol use disorder, moderate, dependence (HCC) 04/03/2018   Substance abuse (HCC) 04/02/2018   Encounter for maternal care for low transverse scar from previous cesarean delivery 04/20/2017   Indication for care in labor or delivery 04/17/2017   Anemia associated with acute blood loss 02/13/2016   Postpartum care following cesarean delivery 02/12/2016     Major depression, single episode 02/12/2016   Postoperative anemia due to acute blood loss 02/12/2016   Adjustment disorder with depressed mood 02/11/2016   Substance abuse affecting pregnancy in third trimester, antepartum 02/09/2016   Labor and delivery, indication for care 02/03/2016   Substance induced mood disorder (HCC) 01/28/2016   Severe major depression, single episode, without psychotic features (HCC) 01/28/2016   Cocaine use disorder, moderate, dependence (HCC) 01/28/2016   Opioid use disorder, mild, in sustained remission, on maintenance therapy (HCC) 01/28/2016   Opiate withdrawal (HCC) 01/28/2016    Past Surgical History:  Procedure Laterality Date   CESAREAN SECTION N/A 02/09/2016   Procedure: CESAREAN SECTION;  Surgeon: Nadara Mustard, MD;  Location: [facility] ORS;  Service: Obstetrics;  Laterality: N/A;   CESAREAN SECTION N/A 04/20/2017   Procedure: CESAREAN SECTION;  Surgeon: Vena Austria, MD;  Location: [facility] ORS;  Service: Obstetrics;  Laterality: N/A;    Prior to Admission medications   Medication Sig Start Date End Date Taking? Authorizing Provider  cephALEXin (KEFLEX) 500 MG capsule Take 1 capsule (500 mg total) by mouth every 12 (twelve) hours. 09/09/18   Clapacs, Jackquline Denmark, MD  FLUoxetine (PROZAC) 20 MG capsule Take 2 capsules (40 mg total) by mouth daily. 09/09/18   Clapacs, Jackquline Denmark, MD  methadone (DOLOPHINE) 10 MG tablet Take 80 mg by mouth daily.    [provider]  QUEtiapine (SEROQUEL) 300 MG tablet Take 1 tablet (300 mg total) by mouth at bedtime. 09/09/18   Clapacs, Jackquline Denmark, MD  traZODone (  DESYREL) 100 MG tablet Take 1 tablet (100 mg total) by mouth at bedtime as needed for sleep. 09/09/18   Clapacs, Madie Reno, MD    Allergies Naproxen  Family History  Problem Relation Age of Onset   Drug abuse Mother     Social History Social History   Tobacco Use   Smoking status: Current Every Day Smoker    Packs/day: 0.50    Types: Cigarettes    Smokeless tobacco: Current User  Substance Use Topics   Alcohol use: Yes   Drug use: Yes    Frequency: 2.0 times per week    Types: Cocaine, Marijuana, Heroin    Comment: heroin    Review of Systems Constitutional: No fever/chills Eyes: No visual changes. ENT: No sore throat. Cardiovascular: Denies chest pain. Respiratory: Denies shortness of breath. Gastrointestinal: No abdominal pain.  No nausea, no vomiting.  No diarrhea.  No constipation. Genitourinary: Negative for dysuria. Musculoskeletal: Negative for neck pain.  Negative for back pain. Integumentary: Negative for rash. Neurological: Negative for headaches, focal weakness or numbness. Psychiatric:  Positive for substance abuse (heroin)   ____________________________________________   PHYSICAL EXAM:  VITAL SIGNS: ED Triage Vitals [11/15/19 0254]  Enc Vitals Group     BP 92/69     Pulse Rate 62     Resp 14     Temp 98.4 F (36.9 C)     Temp Source Oral     SpO2 100 %     Weight      Height      Head Circumference      Peak Flow      Pain Score      Pain Loc      Pain Edu?      Excl. in [place]?     Constitutional: Alert and oriented.  Very bizarre behavior very restless Eyes: Conjunctivae are normal.  Head: Atraumatic. Mouth/Throat: Multiple dental caries without any evidence of abscess. Neck: No stridor.  No meningeal signs.   Cardiovascular: Normal rate, regular rhythm. Good peripheral circulation. Grossly normal heart sounds. Respiratory: Normal respiratory effort.  No retractions. Gastrointestinal: Soft and nontender. No distention.  Musculoskeletal: No lower extremity tenderness nor edema. No gross deformities of extremities. Neurologic:  Normal speech and language. No gross focal neurologic deficits are appreciated.  Skin:  Skin is warm, dry and intact. Psychiatric: Very bizarre affect, restless speech and behavior are normal.  ____________________________________________   LABS (all labs  ordered are listed, but only abnormal results are displayed)  Labs Reviewed  COMPREHENSIVE METABOLIC PANEL - Abnormal; Notable for the following components:      Result Value   Sodium 131 (*)    Potassium 3.2 (*)    Glucose, Bld 106 (*)    All other components within normal limits  ACETAMINOPHEN LEVEL - Abnormal; Notable for the following components:   Acetaminophen (Tylenol), Serum <10 (*)    All other components within normal limits  SALICYLATE LEVEL - Abnormal; Notable for the following components:   Salicylate Lvl <0.1 (*)    All other components within normal limits  CBC  ETHANOL  URINE DRUG SCREEN, QUALITATIVE ([facility] ONLY)  CBG MONITORING, ED  POC URINE PREG, ED   ED ECG REPORT I, [place] Rayola Everhart, the attending physician, personally viewed and interpreted this ECG.   Date: 11/15/2019  EKG Time: 2:46 AM  Rate: 72  Rhythm: Normal sinus rhythm  Axis: Normal  Intervals: Normal  ST&T Change: None      Procedures  ____________________________________________   INITIAL IMPRESSION / MDM / ASSESSMENT AND PLAN / ED COURSE  As part of my medical decision making, I reviewed the following data within the electronic MEDICAL RECORD NUMBER 29 year old female presented with above-stated history and physical exam differential diagnosis including but not limited to Tylenol overdose, intoxication, opiate withdrawal.  Patient's Tylenol level less than 10 on the initial laboratory data.  Patient subsequently started having episodes of vomiting in the emergency department and stated that she was withdrawing from opiates.  When asked again when was the last time she used heroin she stated that she used it yesterday morning at 10 AM and has not used any since.  Patient requesting to leave the emergency department.  Patient was given Suboxone and involuntarily committed as a 4-hour Tylenol level has not been obtained.  Given patient's initial statement of using heroin 2 hours before arrival and  potential grave consequences associated with Tylenol overdose involuntary commitment was enacted.  Plan to obtain a 4-hour Tylenol level.  Pending psychiatry evaluation  ____________________________________________  FINAL CLINICAL IMPRESSION(S) / ED DIAGNOSES  Final diagnoses:  Opiate withdrawal (HCC)     MEDICATIONS GIVEN DURING THIS VISIT:  Medications  buprenorphine-naloxone (SUBOXONE) 8-2 mg per SL tablet 1 tablet (1 tablet Sublingual Given 11/15/19 0458)  sodium chloride 0.9 % bolus 1,000 mL (0 mLs Intravenous Stopped 11/15/19 0456)  ondansetron (ZOFRAN) injection 4 mg (4 mg Intravenous Given 11/15/19 0252)  lidocaine (XYLOCAINE) 2 % viscous mouth solution 15 mL (15 mLs Mouth/Throat Given 11/15/19 0259)  ondansetron (ZOFRAN) injection 4 mg (4 mg Intravenous Given 11/15/19 2202)     ED Discharge Orders    None      *Please note:  Leaner Morici was evaluated in Emergency Department on 11/15/2019 for the symptoms described in the history of present illness. She was evaluated in the context of the global COVID-19 pandemic, which necessitated consideration that the patient might be at risk for infection with the SARS-CoV-2 virus that causes COVID-19. Institutional protocols and algorithms that pertain to the evaluation of patients at risk for COVID-19 are in a state of rapid change based on information released by regulatory bodies including the CDC and federal and state organizations. These policies and algorithms were followed during the patient's care in the ED.  Some ED evaluations and interventions may be delayed as a result of limited staffing during the pandemic.*  Note:  This document was prepared using Dragon voice recognition software and may include unintentional dictation errors.   Darci Current, MD 11/15/19 Jeralyn Bennett

## 2019-11-15 NOTE — ED Notes (Signed)
Patient evaluated by psych this morning. IVC rescinded. Patient discharged to home with out patient resources. Patient reported was going to the methadone clinic.

## 2019-11-15 NOTE — BH Assessment (Signed)
Assessment Note  Tara Raymond is an 29 y.o. female who presented to [facility] ED involuntarily for treatment. Per triage note, Pt arrived via EMS from home where pt called out due to dental pain and abdominal pain. Pt reported taking 24, 500mg  tylenol as well as 4 Tylenol PM over the course of 14 hours. Pt nauseous on arrival to ED. Pt denies SI and HI. Pt last used heroin in last 24 hours.   During TTS assessment, Pt presented cooperative with an irritable and anxious mood. Pt reported the need to leave for work at the start of the assessment. Pt denied attempt to commit suicide and stated "I just took to many pills for my tooth hurting". Pt denied any current/active substance use. After relying information provide in triage note regarding heroin use pt did not respond. Pt denied any inpatient needs and reports to be actively involved with [facility] for counseling. Pt denied any MH struggles or prescribed medications. Pt denied any family history of MH/SA.   Pt denies any SI/HI/AH/VH. Pt was able to contract her safety and continued to express the need to return to work.   Per Dr. , Pt will be discharged with the recommendation to follow up with current service providers.   Diagnosis:  Opioid use disorder, mild, in sustained remission  Past Medical History:  Past Medical History:  Diagnosis Date  . Anemia   . Cocaine use disorder (HCC)   . Dyspnea    with pregnancy  . Mental disorder   . Opioid use disorder, moderate, dependence (HCC)   . Substance induced mood disorder [facility])     Past Surgical History:  Procedure Laterality Date  . CESAREAN SECTION N/A 02/09/2016   Procedure: CESAREAN SECTION;  Surgeon: 02/11/2016, MD;  Location: [facility] ORS;  Service: Obstetrics;  Laterality: N/A;  . CESAREAN SECTION N/A 04/20/2017   Procedure: CESAREAN SECTION;  Surgeon: 06/20/2017, MD;  Location: [facility] ORS;  Service: Obstetrics;  Laterality: N/A;    Family History:  Family History   Problem Relation Age of Onset  . Drug abuse Mother     Social History:  reports that she has been smoking cigarettes. She has been smoking about 0.50 packs per day. She uses smokeless tobacco. She reports current alcohol use. She reports current drug use. Frequency: 2.00 times per week. Drugs: Cocaine, Marijuana, and Heroin.  Additional Social History:  Alcohol / Drug Use Pain Medications: see mar Prescriptions: see mar Over the Counter: see mar History of alcohol / drug use?: No history of alcohol / drug abuse  CIWA: CIWA-Ar BP: 116/85 Pulse Rate: 72 COWS:    Allergies:  Allergies  Allergen Reactions  . Naproxen Hives    Home Medications: (Not in a hospital admission)   OB/GYN Status:  No LMP recorded.  General Assessment Data Location of Assessment: [facility] ED TTS Assessment: In system Is this a Tele or Face-to-Face Assessment?: Face-to-Face Is this an Initial Assessment or a Re-assessment for this encounter?: Initial Assessment Patient Accompanied by:: N/A Language Other than English: No Living Arrangements: Other (Comment)(Private home/apartment ) What gender do you identify as?: Female Marital status: Single Pregnancy Status: No Living Arrangements: Other (Comment)(unknown) Can pt return to current living arrangement?: Yes Admission Status: Involuntary Petitioner: ED Attending Is patient capable of signing voluntary admission?: Yes Referral Source: Other Insurance type: None  Medical Screening Exam [facility] Walk-in ONLY) Medical Exam completed: Yes  Crisis Care Plan Living Arrangements: Other (Comment)(unknown) Name of Therapist: 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 : No Suicidal Intent: No Has  patient had any suicidal intent within the past 6 months prior to admission? : No Is patient at risk for suicide?: No Suicidal Plan?: No Has patient had any suicidal plan within the past 6 months prior to admission? : No Access to Means: No What has been your use of drugs/alcohol within the last 12 months?: None (Pt denies ) Previous Attempts/Gestures: No How many times?: 0 Other Self Harm Risks: None reported  Triggers for Past Attempts: Unknown(Pt denies ) Intentional Self Injurious Behavior: None Family Suicide History: Unknown Recent stressful life event(s): (None reported ) Persecutory voices/beliefs?: No Depression: No Depression Symptoms: (None reported ) Substance abuse history and/or treatment for substance abuse?: No(Pt denies ) Suicide prevention information given to non-admitted patients: Yes  Risk to Others within the past 6 months Homicidal Ideation: No Does patient have any lifetime risk of violence toward others beyond the six months prior to admission? : No Thoughts of Harm to Others: No Current Homicidal Intent: No Current Homicidal Plan: No Access to Homicidal Means: No Identified Victim: n/a History of harm to others?: No Assessment of Violence: None Noted Violent Behavior Description: n/a Does patient have access to weapons?: No Criminal Charges Pending?: No Does patient have a court date: No Is patient on probation?: Unknown  Psychosis Hallucinations: None noted Delusions: None noted  Mental Status Report Appearance/Hygiene: In scrubs Eye Contact: Fair Motor Activity: Unremarkable Speech: Logical/coherent Level of Consciousness: Alert Mood: Anxious, Irritable Affect: Anxious, Irritable Anxiety Level: Minimal Thought Processes: Coherent, Relevant Judgement: Partial Orientation: Person, Place, Time, Situation Obsessive Compulsive Thoughts/Behaviors: None  Cognitive Functioning Concentration: Good Memory: Recent Intact, Remote Intact Is  patient IDD: No Insight: Fair Impulse Control: Good Appetite: Good Have you had any weight changes? : No Change Sleep: No Change Total Hours of Sleep: 8 Vegetative Symptoms: None  ADLScreening [facility] Assessment Services) Patient's cognitive ability adequate to safely complete daily activities?: Yes Patient able to express need for assistance with ADLs?: Yes Independently performs ADLs?: Yes (appropriate for developmental age)  Prior Inpatient Therapy Prior Inpatient Therapy: Yes Prior Therapy Dates: 1-2 years ago Prior Therapy Facilty/Provider(s): [facility]  Reason for Treatment: UTA(Pt reports to not remember )  Prior Outpatient Therapy Prior Outpatient Therapy: Yes Prior Therapy Dates: (Pt reports a few weeks ago ) Prior Therapy Facilty/Provider(s): [facility] ) Reason for Treatment: Counseling  Does patient have an ACCT team?: No Does patient have Intensive In-House Services?  : No Does patient have [facility] services? : No Does patient have P4CC services?: No  ADL Screening (condition at time of admission) Patient's cognitive ability adequate to safely complete daily activities?: Yes Is the patient deaf or have difficulty hearing?: No Does the patient have difficulty seeing, even when wearing glasses/contacts?: No Does the patient have difficulty concentrating, remembering, or making decisions?: No Patient able to express need for assistance with ADLs?: Yes Does the patient have difficulty dressing or bathing?: No Independently performs ADLs?: Yes (appropriate for developmental age) Does the patient have difficulty walking or climbing stairs?: No Weakness of Legs: None Weakness of Arms/Hands: None  Home Assistive Devices/Equipment Home Assistive Devices/Equipment: None  Therapy Consults (therapy consults require a physician order) PT Evaluation Needed: No OT  Evalulation Needed: No SLP Evaluation Needed: No Abuse/Neglect Assessment (Assessment to be complete while patient  is alone) Abuse/Neglect Assessment Can Be Completed: Yes Physical Abuse: Denies Verbal Abuse: Denies Sexual Abuse: Denies Exploitation of patient/patient's resources: Denies Self-Neglect: Denies Values / Beliefs Cultural Requests During Hospitalization: None Spiritual Requests During Hospitalization: None Consults Spiritual Care Consult Needed: No Transition of Care Team Consult Needed: No            Disposition:  Disposition Initial Assessment Completed for this Encounter: Yes Patient referred to: Other (Comment)(Follow up with [facility] )  On Site Evaluation by:   Reviewed with Physician:    Tara Raymond 11/15/2019 12:58 PM

## 2019-11-15 NOTE — ED Provider Notes (Addendum)
-----------------------------------------   7:02 AM on 11/15/2019 -----------------------------------------  Blood pressure 92/69, pulse 65, temperature 98.4 F (36.9 C), temperature source Oral, resp. rate 18, SpO2 100 %, unknown if currently breastfeeding.  Assuming care from Dr. Manson Passey.  In short, Tara Raymond is a 29 y.o. female with a chief complaint of Drug Overdose .  Refer to the original H&P for additional details.  The current plan of care is to follow-up repeat tylenol level and psych consult once medically cleared.  ----------------------------------------- 9:22 AM on 11/15/2019 -----------------------------------------  Repeat Tylenol level is undetectable, patient now medically cleared for psychiatric evaluation.  The patient has been placed in psychiatric observation due to the need to provide a safe environment for the patient while obtaining psychiatric consultation and evaluation, as well as ongoing medical and medication management to treat the patient's condition.  The patient has been placed under full IVC at this time.  ----------------------------------------- 11:14 AM on 11/15/2019 -----------------------------------------  Patient cleared by psychiatry and IVC rescinded.  Plan is for her to be discharged home with follow-up at the methadone clinic.  Patient agrees with plan.    Chesley Noon, MD 11/15/19 3212    Chesley Noon, MD 11/15/19 1114

## 2019-11-15 NOTE — ED Triage Notes (Signed)
Pt arrived via EMS from home where pt called out due to dental pain and abdominal pain. Pt reported taking 24, 500mg  tylenol as well as 4 Tylenol PM over the course of 14 hours. Pt nauseous on arrival to ED. Pt denies SI and HI. Pt last used heroin in last 24 hours.

## 2019-11-15 NOTE — ED Notes (Signed)
Patient refused breakfast ,placed in room ,and will warm up if patient changes her mind.

## 2019-11-15 NOTE — ED Notes (Signed)
Patient did not waith for physical paper work, patient left ambulated out of ed after getting dressed. Md aware.

## 2019-11-15 NOTE — ED Notes (Signed)
Breakfast was offered, patient declined.

## 2019-11-15 NOTE — ED Notes (Signed)
Patient was sitting on floor screaming wanting to go home. Patient was redirected to bed by ED tech [facility]. This Clinical research associate went to check on patient. Patient demanding to go home and call her mom. This Clinical research associate explained she is not able to go home at this time and she can call her mom later this morning.

## 2019-11-15 NOTE — ED Notes (Signed)
1 green and black pant 2 blue/white socks 1 black shirt 2 black shoes 1 red/white blanket   Personal belongings obtained from pt and placed into personal belongings bag. Pt changed into hospital scrubs. Report given to Selena Batten, RN and pt moved to 22.

## 2019-11-15 NOTE — ED Notes (Signed)
Patient currently calm and watching TV in room.

## 2019-11-15 NOTE — ED Notes (Signed)
Pt continuing to dry heave in room. Pt yelling and sts, "I am leaving. I need to go home." MD made aware.

## 2019-11-15 NOTE — ED Notes (Signed)
Patient brought to room 22 from room 3. Patient did not want to go to room 22. Patient asking to leave. Patient advised to stay calm and in the morning can speak with Psych. Patient states she is not here for that, she is here cause she does not feel well. Patient denies taking 20 tylenol. Patient dressed out by this Clinical research associate along with charge RN, and Research scientist (life sciences) in room 22.

## 2019-11-15 NOTE — Consult Note (Signed)
[facility] Face-to-Face Psychiatry Consult   Reason for Consult:  Accidental overdose and opioid use Referring Physician:  ED MD  Patient Identification: Tara Raymond MRN:  [id number] Principal Diagnosis: Opioid use disorder, mild, in sustained remission, on maintenance therapy (HCC) Diagnosis:  Principal Problem:   Opioid use disorder, mild, in sustained remission, on maintenance therapy (HCC)   Total Time spent with patient: 15 minutes  The patient's history and presentation is as described below.  In my interview with her she describes the excessive taking of pills only to treat a headache.  The tests here in the emergency department have not shown evidence that she took acetaminophen so potentially she took another pain reliever.  She specifically denies any recent or current lethality or intent to harm her self or others.  She also denies that she has depression or other mood symptoms as well as hallucinations or delusions.  She admits to substance use although she states she is is seen in the methadone clinic.  This would run counter to the information below that she had just used heroin.  I did not see evidence of lethality either in her prior actions or and what we have seen in the emergency department or in my interview with her.  I thus rescinded the IVC.  Medicine followed her Tylenol and cleared her medically.  Subjective:   Tara Raymond is a 29 y.o. female patient admitted with Accidental overdose and opioid use HPI: Tara Raymond is a 29 y.o. female with below list of previous medical conditions presents to the emergency department via EMS stating that she took 24 Tylenol 500 mg tablets and 4 Tylenol PM tablets over the course of the last 14 hours secondary to dental pain.  Patient admits to nausea and abdominal cramping.  Patient states that she last used heroin 2 hours before arrival.  Patient states that she took the Tylenol to relieve her discomfort and that  this was not a suicide attempt.  Patient very restless with bizarre behavior on arrival to the emergency department  Past Psychiatric History:   Risk to Self:   Risk to Others:   Prior Inpatient Therapy:   Prior Outpatient Therapy:    Past Medical History:  Past Medical History:  Diagnosis Date  . Anemia   . Cocaine use disorder (HCC)   . Dyspnea    with pregnancy  . Mental disorder   . Opioid use disorder, moderate, dependence (HCC)   . Substance induced mood disorder [facility])     Past Surgical History:  Procedure Laterality Date  . CESAREAN SECTION N/A 02/09/2016   Procedure: CESAREAN SECTION;  Surgeon: Nadara Mustard, MD;  Location: [facility] ORS;  Service: Obstetrics;  Laterality: N/A;  . CESAREAN SECTION N/A 04/20/2017   Procedure: CESAREAN SECTION;  Surgeon: Vena Austria, MD;  Location: [facility] ORS;  Service: Obstetrics;  Laterality: N/A;   Family History:  Family History  Problem Relation Age of Onset  . Drug abuse Mother    Family Psychiatric  History: Social History:  Social History   Substance and Sexual Activity  Alcohol Use Yes     Social History   Substance and Sexual Activity  Drug Use Yes  . Frequency: 2.0 times per week  . Types: Cocaine, Marijuana, Heroin   Comment: heroin    Social History   Socioeconomic History  . Marital status: Single    Spouse name: Not on file  . Number of children: Not on file  . Years  of education: Not on file  . Highest education level: Not on file  Occupational History  . Not on file  Tobacco Use  . Smoking status: Current Every Day Smoker    Packs/day: 0.50    Types: Cigarettes  . Smokeless tobacco: Current User  Substance and Sexual Activity  . Alcohol use: Yes  . Drug use: Yes    Frequency: 2.0 times per week    Types: Cocaine, Marijuana, Heroin    Comment: heroin  . Sexual activity: Yes  Other Topics Concern  . Not on file  Social History Narrative  . Not on file   Social Determinants of Health    Financial Resource Strain:   . Difficulty of Paying Living Expenses:   Food Insecurity:   . Worried About Programme researcher, broadcasting/film/video in the Last Year:   . Barista in the Last Year:   Transportation Needs:   . Freight forwarder (Medical):   Marland Kitchen Lack of Transportation (Non-Medical):   Physical Activity:   . Days of Exercise per Week:   . Minutes of Exercise per Session:   Stress:   . Feeling of Stress :   Social Connections:   . Frequency of Communication with Friends and Family:   . Frequency of Social Gatherings with Friends and Family:   . Attends Religious Services:   . Active Member of Clubs or Organizations:   . Attends Banker Meetings:   Marland Kitchen Marital Status:    Additional Social History:    Allergies:   Allergies  Allergen Reactions  . Naproxen Hives    Labs:  Results for orders placed or performed during the hospital encounter of 11/15/19 (from the past 48 hour(s))  Comprehensive metabolic panel     Status: Abnormal   Collection Time: 11/15/19  2:46 AM  Result Value Ref Range   Sodium 131 (L) 135 - 145 mmol/L   Potassium 3.2 (L) 3.5 - 5.1 mmol/L   Chloride 101 98 - 111 mmol/L   CO2 24 22 - 32 mmol/L   Glucose, Bld 106 (H) 70 - 99 mg/dL    Comment: Glucose reference range applies only to samples taken after fasting for at least 8 hours.   BUN 15 6 - 20 mg/dL   Creatinine, Ser 3.70 0.44 - 1.00 mg/dL   Calcium 8.9 8.9 - 48.8 mg/dL   Total Protein 7.8 6.5 - 8.1 g/dL   Albumin 4.1 3.5 - 5.0 g/dL   AST 26 15 - 41 U/L   ALT 18 0 - 44 U/L   Alkaline Phosphatase 45 38 - 126 U/L   Total Bilirubin 0.5 0.3 - 1.2 mg/dL   GFR calc non Af Amer >60 >60 mL/min   GFR calc Af Amer >60 >60 mL/min   Anion gap 6 5 - 15    Comment: Performed at [facility], [address] [place], [place] [address]  cbc     Status: None   Collection Time: 11/15/19  2:46 AM  Result Value Ref Range   WBC 6.7 4.0 - 10.5 K/uL   RBC 4.37 3.87 - 5.11 MIL/uL    Hemoglobin 12.7 12.0 - 15.0 g/dL   HCT 45.0 38.8 - 82.8 %   MCV 88.3 80.0 - 100.0 fL   MCH 29.1 26.0 - 34.0 pg   MCHC 32.9 30.0 - 36.0 g/dL   RDW 00.3 49.1 - 79.1 %   Platelets 248 150 - 400 K/uL   nRBC 0.0  0.0 - 0.2 %    Comment: Performed at [facility], [address] [place], [place] [address]  Acetaminophen level     Status: Abnormal   Collection Time: 11/15/19  2:46 AM  Result Value Ref Range   Acetaminophen (Tylenol), Serum <10 (L) 10 - 30 ug/mL    Comment: (NOTE) Therapeutic concentrations vary significantly. A range of 10-30 ug/mL  may be an effective concentration for many patients. However, some  are best treated at concentrations outside of this range. Acetaminophen concentrations >150 ug/mL at 4 hours after ingestion  and >50 ug/mL at 12 hours after ingestion are often associated with  toxic reactions. Performed at [facility], [address] [place], [place] [address]   Ethanol     Status: None   Collection Time: 11/15/19  2:46 AM  Result Value Ref Range   Alcohol, Ethyl (B) <10 <10 mg/dL    Comment: (NOTE) Lowest detectable limit for serum alcohol is 10 mg/dL. For medical purposes only. Performed at [facility], [address] [place], [place] [address]   Salicylate level     Status: Abnormal   Collection Time: 11/15/19  2:46 AM  Result Value Ref Range   Salicylate Lvl <4.2 (L) 7.0 - 30.0 mg/dL    Comment: Performed at [facility], [address], [place], [place] [address]  Respiratory Panel by RT PCR (Flu A&B, Covid) - Nasopharyngeal Swab     Status: None   Collection Time: 11/15/19  7:45 AM   Specimen: Nasopharyngeal Swab  Result Value Ref Range   SARS Coronavirus 2 by RT PCR NEGATIVE NEGATIVE    Comment: (NOTE) SARS-CoV-2 target nucleic acids are NOT DETECTED. The SARS-CoV-2 RNA is generally detectable in upper respiratoy specimens during the acute phase of infection. The lowest concentration of  SARS-CoV-2 viral copies this assay can detect is 131 copies/mL. A negative result does not preclude SARS-Cov-2 infection and should not be used as the sole basis for treatment or other patient management decisions. A negative result may occur with  improper specimen collection/handling, submission of specimen other than nasopharyngeal swab, presence of viral mutation(s) within the areas targeted by this assay, and inadequate number of viral copies (<131 copies/mL). A negative result must be combined with clinical observations, patient history, and epidemiological information. The expected result is Negative. Fact Sheet for Patients:  PinkCheek.be Fact Sheet for Healthcare Providers:  GravelBags.it This test is not yet ap proved or cleared by the [place] FDA and  has been authorized for detection and/or diagnosis of SARS-CoV-2 by FDA under an Emergency Use Authorization (EUA). This EUA will remain  in effect (meaning this test can be used) for the duration of the COVID-19 declaration under Section 564(b)(1) of the Act, 21 U.S.C. section 360bbb-3(b)(1), unless the authorization is terminated or revoked sooner.    Influenza A by PCR NEGATIVE NEGATIVE   Influenza B by PCR NEGATIVE NEGATIVE    Comment: (NOTE) The Xpert Xpress SARS-CoV-2/FLU/RSV assay is intended as an aid in  the diagnosis of influenza from Nasopharyngeal swab specimens and  should not be used as a sole basis for treatment. Nasal washings and  aspirates are unacceptable for Xpert Xpress SARS-CoV-2/FLU/RSV  testing. Fact Sheet for Patients: PinkCheek.be Fact Sheet for Healthcare Providers: GravelBags.it This test is not yet approved or cleared by the [place] FDA and  has been authorized for detection and/or diagnosis of SARS-CoV-2 by  FDA under an Emergency Use Authorization (EUA). This EUA will  remain  in effect (meaning this test can be used) for the duration of the  Covid-19 declaration under Section 564(b)(1) of the Act, 21  U.S.C. section 360bbb-3(b)(1), unless the authorization is  terminated or revoked. Performed at [facility], [address] [place], [place] [address]   Acetaminophen level     Status: Abnormal   Collection Time: 11/15/19  7:45 AM  Result Value Ref Range   Acetaminophen (Tylenol), Serum <10 (L) 10 - 30 ug/mL    Comment: (NOTE) Therapeutic concentrations vary significantly. A range of 10-30 ug/mL  may be an effective concentration for many patients. However, some  are best treated at concentrations outside of this range. Acetaminophen concentrations >150 ug/mL at 4 hours after ingestion  and >50 ug/mL at 12 hours after ingestion are often associated with  toxic reactions. Performed at [facility], [address], [place], [place] [address]     Current Facility-Administered Medications  Medication Dose Route Frequency Provider Last Rate Last Admin  . buprenorphine-naloxone (SUBOXONE) 8-2 mg per SL tablet 1 tablet  1 tablet Sublingual Daily Darci Current, MD   1 tablet at 11/15/19 0458  . buprenorphine-naloxone (SUBOXONE) 8-2 mg per SL tablet 1 tablet  1 tablet Sublingual Daily Darci Current, MD   Stopped at 11/15/19 [phone]   Current Outpatient Medications  Medication Sig Dispense Refill  . cephALEXin (KEFLEX) 500 MG capsule Take 1 capsule (500 mg total) by mouth every 12 (twelve) hours. 4 capsule 0  . FLUoxetine (PROZAC) 20 MG capsule Take 2 capsules (40 mg total) by mouth daily. 60 capsule 1  . methadone (DOLOPHINE) 10 MG tablet Take 80 mg by mouth daily.    . QUEtiapine (SEROQUEL) 300 MG tablet Take 1 tablet (300 mg total) by mouth at bedtime. 30 tablet 1  . traZODone (DESYREL) 100 MG tablet Take 1 tablet (100 mg total) by mouth at bedtime as needed for sleep. 30 tablet 1     Psychiatric Specialty Exam: Physical  Exam  Review of Systems  Blood pressure 116/85, pulse 72, temperature 98.3 F (36.8 C), temperature source Oral, resp. rate 18, SpO2 100 %, unknown if currently breastfeeding.There is no height or weight on file to calculate BMI.  General Appearance: Casual  Eye Contact:  Good  Speech:  Clear and Coherent  Volume:  Normal  Mood:  Euthymic  Affect:  Congruent  Thought Process:  Goal Directed and Linear  Orientation:  Full (Time, Place, and Person)  Thought Content:  Logical  Suicidal Thoughts:  No  Homicidal Thoughts:  No  Memory:  Immediate;   Good  Judgement:  Impaired  Insight:  Fair  Psychomotor Activity:  Normal  Concentration:  Concentration: Fair  Recall:  Good  Fund of Knowledge:  Good  Language:  Good  Akathisia:  No  Handed:   AIMS (if indicated):     Assets:  Physical Health  ADL's:  Intact  Cognition:  WNL  Sleep:        Treatment Plan Summary: No need for treatment at this time beyond follow-up with a methadone clinic  Disposition: No evidence of imminent risk to self or others at present.   Patient does not meet criteria for psychiatric inpatient admission.  Cindy Hazy, MD 11/15/2019 12:04 PM

## 2022-10-14 ENCOUNTER — Ambulatory Visit: Payer: Medicaid Other

## 2023-05-04 ENCOUNTER — Ambulatory Visit: Payer: Medicaid Other

## 2024-03-19 ENCOUNTER — Other Ambulatory Visit: Payer: Self-pay

## 2024-03-19 ENCOUNTER — Emergency Department
Admission: EM | Admit: 2024-03-19 | Discharge: 2024-03-19 | Disposition: A | Discharge status: Home / Self Care | Attending: Emergency Medicine | Admitting: Emergency Medicine

## 2024-03-19 ENCOUNTER — Emergency Department

## 2024-03-19 DIAGNOSIS — R451 Restlessness and agitation: Secondary | ICD-10-CM | POA: Insufficient documentation

## 2024-03-19 DIAGNOSIS — R4182 Altered mental status, unspecified: Secondary | ICD-10-CM | POA: Insufficient documentation

## 2024-03-19 LAB — COMPREHENSIVE METABOLIC PANEL WITH GFR
ALT: 40 U/L (ref 0–44)
AST: 34 U/L (ref 15–41)
Albumin: 4.3 g/dL (ref 3.5–5.0)
Alkaline Phosphatase: 51 U/L (ref 38–126)
Anion gap: 11 (ref 5–15)
BUN: 13 mg/dL (ref 6–20)
CO2: 28 mmol/L (ref 22–32)
Calcium: 9.5 mg/dL (ref 8.9–10.3)
Chloride: 98 mmol/L (ref 98–111)
Creatinine, Ser: 0.62 mg/dL (ref 0.44–1.00)
GFR, Estimated: >60 mL/min (ref >60)
Glucose, Bld: 119 mg/dL — ABNORMAL HIGH (ref 70–99)
Potassium: 4.1 mmol/L (ref 3.5–5.1)
Sodium: 137 mmol/L (ref 135–145)
Total Bilirubin: 0.6 mg/dL (ref 0.0–1.2)
Total Protein: 8.2 g/dL — ABNORMAL HIGH (ref 6.5–8.1)

## 2024-03-19 LAB — CBC WITH DIFFERENTIAL/PLATELET
Abs Immature Granulocytes: 0.02 K/uL (ref 0.00–0.07)
Basophils Absolute: 0 K/uL (ref 0.0–0.1)
Basophils Relative: 0 %
Eosinophils Absolute: 0 K/uL (ref 0.0–0.5)
Eosinophils Relative: 0 %
HCT: 38.9 % (ref 36.0–46.0)
Hemoglobin: 12.8 g/dL (ref 12.0–15.0)
Immature Granulocytes: 0 %
Lymphocytes Relative: 43 %
Lymphs Abs: 3.4 K/uL (ref 0.7–4.0)
MCH: 29.4 pg (ref 26.0–34.0)
MCHC: 32.9 g/dL (ref 30.0–36.0)
MCV: 89.2 fL (ref 80.0–100.0)
Monocytes Absolute: 0.7 K/uL (ref 0.1–1.0)
Monocytes Relative: 9 %
Neutro Abs: 3.8 K/uL (ref 1.7–7.7)
Neutrophils Relative %: 48 %
Platelets: 288 K/uL (ref 150–400)
RBC: 4.36 MIL/uL (ref 3.87–5.11)
RDW: 15 % (ref 11.5–15.5)
WBC: 8 K/uL (ref 4.0–10.5)
nRBC: 0 % (ref 0.0–0.2)

## 2024-03-19 LAB — LACTIC ACID, PLASMA: Lactic Acid, Venous: 0.9 mmol/L (ref 0.5–1.9)

## 2024-03-19 LAB — ETHANOL: Alcohol, Ethyl (B): <15 mg/dL (ref <15)

## 2024-03-19 NOTE — ED Provider Notes (Signed)
 [facility] Provider Note    Event Date/Time   First MD Initiated Contact with Patient 03/19/24 1242     (approximate)   History   Altered Mental Status   HPI  Tara Raymond is a 33 y.o. female with a history of polysubstance abuse, anemia, and an apparent recent diagnosis of seizure disorder who presents with altered mental status after possible seizure.  The patient is somnolent and unable to give any history.  Per EMS, when they arrived the patient appeared postictal and was somnolent.  The mother was present but had not witnessed a seizure.  Subsequently patient became very agitated and combative.  She required sedation with Haldol  and Versed .  Since that time she has been somnolent.  Per EMS, the seizure diagnosis was recent.  They do not know what seizure medications the patient is on.  I reviewed the past medical records.  The patient's most recent encounters in our system are from 2021, evaluated by psychiatry after an accidental opioid overdose.   Physical Exam   Triage Vital Signs: ED Triage Vitals  Encounter Vitals Group     BP 03/19/24 1245 107/80     Girls Systolic BP Percentile --      Girls Diastolic BP Percentile --      Boys Systolic BP Percentile --      Boys Diastolic BP Percentile --      Pulse Rate 03/19/24 1245 81     Resp 03/19/24 1245 14     Temp --      Temp src --      SpO2 03/19/24 1245 100 %     Weight 03/19/24 1248 120 lb 8 oz (54.7 kg)     Height --      Head Circumference --      Peak Flow --      Pain Score 03/19/24 1246 Asleep     Pain Loc --      Pain Education --      Exclude from Growth Chart --     Most recent vital signs: Vitals:   03/19/24 1515 03/19/24 1545  BP:    Pulse: 90 88  Resp: 14 14  Temp:    SpO2: 100% 100%     General: Somnolent, slightly arousable to painful stimuli. CV:  Good peripheral perfusion.  Resp:  Normal effort.  Abd:  No distention.  Other:  EOMI.  Pinpoint  pupils.  No visible trauma.  Motor intact in all extremities.  ED Results / Procedures / Treatments   Labs (all labs ordered are listed, but only abnormal results are displayed) Labs Reviewed  COMPREHENSIVE METABOLIC PANEL WITH GFR - Abnormal; Notable for the following components:      Result Value   Glucose, Bld 119 (*)    Total Protein 8.2 (*)    All other components within normal limits  CBC WITH DIFFERENTIAL/PLATELET  ETHANOL  LACTIC ACID, PLASMA  URINALYSIS, ROUTINE W REFLEX MICROSCOPIC  URINE DRUG SCREEN, QUALITATIVE ([facility] ONLY)     EKG  ED ECG REPORT I, Waylon Cassis, the attending physician, personally viewed and interpreted this ECG.  Date: 03/19/2024 EKG Time: 1253 Rate: 79 Rhythm: normal sinus rhythm QRS Axis: normal Intervals: Borderline QTc ST/T Wave abnormalities: Early repolarization Narrative Interpretation: no evidence of acute ischemia    RADIOLOGY  CT head: I independently viewed and interpreted the images; there is no ICH.  Radiology report indicates no acute abnormality.  PROCEDURES:  Critical Care  performed: No  Procedures   MEDICATIONS ORDERED IN ED: Medications - No data to display   IMPRESSION / MDM / ASSESSMENT AND PLAN / ED COURSE  I reviewed the triage vital signs and the nursing notes.  33 year old female with PMH as noted above presents with altered mental status and concern for possible seizure.  Per EMS they found her and what appeared to be a postictal state although seizure was not witnessed by them or the family members.  She separately became agitated requiring sedation.  On exam the patient has no visible trauma.  Her vital signs are normal except for somewhat low temp.  The patient is arousable to painful stimuli and neuro exam is nonfocal.  Differential diagnosis includes, but is not limited to, seizure with postictal state, intoxication, substance abuse, ICH or other CNS etiology, hypoglycemia or other metabolic  disturbance.  We will obtain CT head, lab workup, and reassess.  Patient's presentation is most consistent with acute presentation with potential threat to life or bodily function.  The patient is on the cardiac monitor to evaluate for evidence of arrhythmia and/or significant heart rate changes.  ----------------------------------------- 3:15 PM on 03/19/2024 -----------------------------------------  The patient is slightly more arousable although still somnolent.  Lab workup is unremarkable.  CMP and CBC show no acute findings.  Lactate is normal which makes me doubt that the patient had a true epileptic seizure.  CT head is negative.  At this time I do not see a strong indication to load her with IV antiepileptics.  We will continue to observe until the sedatives given by EMS wear off.  I have signed the patient out to the oncoming ED physician Dr. Viviann.   FINAL CLINICAL IMPRESSION(S) / ED DIAGNOSES   Final diagnoses:  Altered mental status, unspecified altered mental status type     Rx / DC Orders   ED Discharge Orders     None        Note:  This document was prepared using Dragon voice recognition software and may include unintentional dictation errors.    Jacolyn Pae, MD 03/19/24 1616

## 2024-03-19 NOTE — ED Notes (Signed)
 Patient transported to CT

## 2024-03-19 NOTE — ED Triage Notes (Signed)
 Pt comes in via [facility] after being found down on the floor at home by her mother. According to EMS, family reported that the pt had been acting a but bizarre earlier, and smoked marijuana, unknown if it was laced. When EMS arrived on scene, pt woke up and became combative. Pt given 5 of versed  and 5 of halidol during transport. Pt partially responsive to stimuli. Pt recently diagnosed with seizures.

## 2024-03-19 NOTE — ED Provider Notes (Signed)
 Procedures     ----------------------------------------- 6:33 PM on 03/19/2024 -----------------------------------------   Patient is awake and alert, oriented.  Clinically sober.  Asymptomatic.  Stable for discharge   Viviann Pastor, MD 03/19/24 [id number]
